# Patient Record
Sex: Male | Born: 1935 | Race: White | Hispanic: No | Marital: Married | Smoking: Former smoker
Health system: Southern US, Community
[De-identification: ages and names within clinical notes are randomized; demographics above are authoritative.]

## PROBLEM LIST (undated history)

## (undated) DIAGNOSIS — M21372 Foot drop, left foot: Secondary | ICD-10-CM

## (undated) DIAGNOSIS — I130 Hypertensive heart and chronic kidney disease with heart failure and stage 1 through stage 4 chronic kidney disease, or unspecified chronic kidney disease: Secondary | ICD-10-CM

## (undated) DIAGNOSIS — N182 Chronic kidney disease, stage 2 (mild): Secondary | ICD-10-CM

## (undated) DIAGNOSIS — G629 Polyneuropathy, unspecified: Secondary | ICD-10-CM

## (undated) DIAGNOSIS — I25119 Atherosclerotic heart disease of native coronary artery with unspecified angina pectoris: Secondary | ICD-10-CM

## (undated) DIAGNOSIS — K219 Gastro-esophageal reflux disease without esophagitis: Secondary | ICD-10-CM

## (undated) DIAGNOSIS — C61 Malignant neoplasm of prostate: Secondary | ICD-10-CM

## (undated) DIAGNOSIS — M21371 Foot drop, right foot: Secondary | ICD-10-CM

## (undated) DIAGNOSIS — F329 Major depressive disorder, single episode, unspecified: Secondary | ICD-10-CM

## (undated) DIAGNOSIS — E785 Hyperlipidemia, unspecified: Secondary | ICD-10-CM

## (undated) DIAGNOSIS — I2510 Atherosclerotic heart disease of native coronary artery without angina pectoris: Secondary | ICD-10-CM

## (undated) DIAGNOSIS — J849 Interstitial pulmonary disease, unspecified: Secondary | ICD-10-CM

## (undated) DIAGNOSIS — J8410 Pulmonary fibrosis, unspecified: Secondary | ICD-10-CM

## (undated) DIAGNOSIS — I25810 Atherosclerosis of coronary artery bypass graft(s) without angina pectoris: Secondary | ICD-10-CM

## (undated) DIAGNOSIS — F32A Depression, unspecified: Secondary | ICD-10-CM

## (undated) DIAGNOSIS — I10 Essential (primary) hypertension: Secondary | ICD-10-CM

## (undated) DIAGNOSIS — I5032 Chronic diastolic (congestive) heart failure: Secondary | ICD-10-CM

## (undated) DIAGNOSIS — I2723 Pulmonary hypertension due to lung diseases and hypoxia: Secondary | ICD-10-CM

## (undated) DIAGNOSIS — M1990 Unspecified osteoarthritis, unspecified site: Secondary | ICD-10-CM

## (undated) DIAGNOSIS — F419 Anxiety disorder, unspecified: Secondary | ICD-10-CM

## (undated) HISTORY — DX: Depression, unspecified: F32.A

## (undated) HISTORY — DX: Atherosclerotic heart disease of native coronary artery without angina pectoris: I25.10

## (undated) HISTORY — DX: Foot drop, left foot: M21.372

## (undated) HISTORY — DX: Chronic diastolic (congestive) heart failure: I50.32

## (undated) HISTORY — DX: Major depressive disorder, single episode, unspecified: F32.9

## (undated) HISTORY — DX: Unspecified osteoarthritis, unspecified site: M19.90

## (undated) HISTORY — PX: TONSILLECTOMY: SUR1361

## (undated) HISTORY — DX: Atherosclerosis of coronary artery bypass graft(s) without angina pectoris: I25.810

## (undated) HISTORY — DX: Pulmonary fibrosis, unspecified: J84.10

## (undated) HISTORY — PX: TRANSTHORACIC ECHOCARDIOGRAM: SHX275

## (undated) HISTORY — DX: Essential (primary) hypertension: I10

## (undated) HISTORY — DX: Gastro-esophageal reflux disease without esophagitis: K21.9

## (undated) HISTORY — DX: Interstitial pulmonary disease, unspecified: J84.9

## (undated) HISTORY — PX: OTHER SURGICAL HISTORY: SHX169

## (undated) HISTORY — DX: Foot drop, right foot: M21.371

## (undated) HISTORY — DX: Hyperlipidemia, unspecified: E78.5

## (undated) HISTORY — DX: Interstitial pulmonary disease, unspecified: I27.23

## (undated) HISTORY — DX: Anxiety disorder, unspecified: F41.9

## (undated) HISTORY — DX: Chronic kidney disease, stage 2 (mild): N18.2

## (undated) HISTORY — DX: Polyneuropathy, unspecified: G62.9

## (undated) HISTORY — PX: HEMORRHOID SURGERY: SHX153

## (undated) HISTORY — DX: Hypertensive heart and chronic kidney disease with heart failure and stage 1 through stage 4 chronic kidney disease, or unspecified chronic kidney disease: I13.0

## (undated) HISTORY — PX: CATARACT EXTRACTION, BILATERAL: SHX1313

## (undated) HISTORY — DX: Atherosclerotic heart disease of native coronary artery with unspecified angina pectoris: I25.119

---

## 1999-05-22 ENCOUNTER — Emergency Department (HOSPITAL_COMMUNITY): Admission: EM | Admit: 1999-05-22 | Discharge: 1999-05-22 | Payer: Self-pay | Admitting: Emergency Medicine

## 2000-12-29 DIAGNOSIS — J8410 Pulmonary fibrosis, unspecified: Secondary | ICD-10-CM

## 2000-12-29 HISTORY — DX: Pulmonary fibrosis, unspecified: J84.10

## 2001-02-19 ENCOUNTER — Ambulatory Visit (HOSPITAL_COMMUNITY): Admission: EM | Admit: 2001-02-19 | Discharge: 2001-02-19 | Payer: Self-pay | Admitting: Emergency Medicine

## 2001-03-08 ENCOUNTER — Ambulatory Visit (HOSPITAL_COMMUNITY): Admission: RE | Admit: 2001-03-08 | Discharge: 2001-03-08 | Payer: Self-pay | Admitting: Gastroenterology

## 2001-03-08 ENCOUNTER — Encounter: Payer: Self-pay | Admitting: Gastroenterology

## 2001-07-27 ENCOUNTER — Ambulatory Visit: Admission: RE | Admit: 2001-07-27 | Discharge: 2001-07-27 | Payer: Self-pay

## 2002-05-13 ENCOUNTER — Ambulatory Visit: Admission: RE | Admit: 2002-05-13 | Discharge: 2002-05-13 | Payer: Self-pay | Admitting: Gastroenterology

## 2002-12-29 DIAGNOSIS — I25119 Atherosclerotic heart disease of native coronary artery with unspecified angina pectoris: Secondary | ICD-10-CM

## 2002-12-29 HISTORY — PX: CORONARY ARTERY BYPASS GRAFT: SHX141

## 2002-12-29 HISTORY — DX: Atherosclerotic heart disease of native coronary artery with unspecified angina pectoris: I25.119

## 2003-10-10 ENCOUNTER — Ambulatory Visit (HOSPITAL_COMMUNITY): Admission: RE | Admit: 2003-10-10 | Discharge: 2003-10-10 | Payer: Self-pay | Admitting: Cardiovascular Disease

## 2003-10-10 ENCOUNTER — Encounter: Payer: Self-pay | Admitting: Cardiovascular Disease

## 2003-10-13 ENCOUNTER — Inpatient Hospital Stay (HOSPITAL_COMMUNITY): Admission: RE | Admit: 2003-10-13 | Discharge: 2003-10-17 | Payer: Self-pay | Admitting: Surgery

## 2003-10-13 ENCOUNTER — Encounter: Payer: Self-pay | Admitting: Surgery

## 2003-10-14 ENCOUNTER — Encounter: Payer: Self-pay | Admitting: Surgery

## 2003-10-15 ENCOUNTER — Encounter: Payer: Self-pay | Admitting: Surgery

## 2003-11-25 ENCOUNTER — Encounter (HOSPITAL_COMMUNITY): Admission: RE | Admit: 2003-11-25 | Discharge: 2004-02-23 | Payer: Self-pay | Admitting: Cardiology

## 2004-09-24 ENCOUNTER — Inpatient Hospital Stay (HOSPITAL_COMMUNITY): Admission: EM | Admit: 2004-09-24 | Discharge: 2004-09-24 | Payer: Self-pay | Admitting: *Deleted

## 2004-09-24 HISTORY — PX: CARDIAC CATHETERIZATION: SHX172

## 2007-10-20 ENCOUNTER — Encounter: Admission: RE | Admit: 2007-10-20 | Discharge: 2007-10-20 | Payer: Self-pay | Admitting: General Surgery

## 2007-10-22 ENCOUNTER — Encounter (INDEPENDENT_AMBULATORY_CARE_PROVIDER_SITE_OTHER): Payer: Self-pay | Admitting: General Surgery

## 2007-10-22 ENCOUNTER — Ambulatory Visit (HOSPITAL_BASED_OUTPATIENT_CLINIC_OR_DEPARTMENT_OTHER): Admission: RE | Admit: 2007-10-22 | Discharge: 2007-10-22 | Payer: Self-pay | Admitting: General Surgery

## 2008-01-19 HISTORY — PX: US ECHOCARDIOGRAPHY: HXRAD669

## 2009-10-01 ENCOUNTER — Encounter: Admission: RE | Admit: 2009-10-01 | Discharge: 2009-10-01 | Payer: Self-pay | Admitting: Cardiology

## 2009-11-21 ENCOUNTER — Ambulatory Visit (HOSPITAL_COMMUNITY): Admission: RE | Admit: 2009-11-21 | Discharge: 2009-11-21 | Payer: Self-pay | Admitting: Urology

## 2009-12-04 ENCOUNTER — Ambulatory Visit (HOSPITAL_COMMUNITY): Admission: RE | Admit: 2009-12-04 | Discharge: 2009-12-04 | Payer: Self-pay | Admitting: Urology

## 2009-12-10 ENCOUNTER — Ambulatory Visit: Admission: RE | Admit: 2009-12-10 | Discharge: 2009-12-28 | Payer: Self-pay | Admitting: Radiation Oncology

## 2010-01-02 ENCOUNTER — Ambulatory Visit
Admission: RE | Admit: 2010-01-02 | Discharge: 2010-03-08 | Payer: Self-pay | Source: Home / Self Care | Admitting: Radiation Oncology

## 2010-07-17 HISTORY — PX: CARDIOVASCULAR STRESS TEST: SHX262

## 2010-09-24 ENCOUNTER — Ambulatory Visit: Payer: Self-pay | Admitting: Cardiology

## 2011-01-28 ENCOUNTER — Ambulatory Visit: Payer: Self-pay | Admitting: Cardiology

## 2011-05-13 NOTE — Op Note (Signed)
NAME:  MANSOOR, HILLYARD             ACCOUNT NO.:  [id number]   MEDICAL RECORD NO.:  [id number]          PATIENT TYPE:  AMB   LOCATION:  [facility]                         FACILITY:  [facility]   PHYSICIAN:  Orson Ape. Weatherly, M.D.DATE OF BIRTH:  [birth year]   DATE OF PROCEDURE:  10/22/2007  DATE OF DISCHARGE:                               OPERATIVE REPORT   PREOPERATIVE DIAGNOSIS:  Internal and external hemorrhoids, pretty  severe.   POSTOPERATIVE DIAGNOSIS:  Internal and external hemorrhoids, pretty  severe.   OPERATION:  Internal and external hemorrhoidectomy.   ANESTHESIA:  General anesthesia.   SURGEON:  Dr. Jeanella Anton   ASSISTANT:  Nurse.   HISTORY:  Jidenna Figgs is a 76 year old male, who was referred to me  with Warren Danes for various symptomatic hemorrhoids.  The patient is  on aspirin and Plavix for coronary artery disease, as they got a bunch  of stents and, of course, he is having problems with bleeding and  hemorrhoids prolapsing.  Dr. Mare Ferrari said it would be fine for him to  be off his Plavix which he has been off for approximately a week, and he  is here now for the planned procedure.  He took a bottle of magnesium  citrate yesterday and said he had several bowel movements and has  actually had several this morning, and we gave him 1 g of Ancef  preoperatively.   The patient was positioned on the anesthesia table, induction of general  anesthesia and LOA tube, and then we put him up in the yellowfin  stirrups.  The area of the perianal area and rectum was prepped with  Betadine, first scrub and then solution and draped in a sterile manner.  With placing the anoscope into the anal canal, he has obviously got a  lot of diarrhea, and we sucked out the diarrhea that obviously the  laxative that he has taken he has not completely evacuated everything.  I then kind of reprepped him so I could anesthetize the sphincter area  for about 20 mL of 0.5% Marcaine  with adrenaline, about 10 in each side.  Next, with the patient's hemorrhoid, we marked them.  He has 3 large.  The largest is on the left side.  The right posterolateral is next, and  then there was a smaller one anterior.  I elevated the hemorrhoid off  the internal sphincter.  The little bleeding laterally and externally  was sutured with 3-0 chromic, and then a Bowie clamp was clamped on the  hemorrhoid after it had been elevated from the internal sphincter, then  the hemorrhoid removed.  I put a couple of U stitches of 2-0 chromic and  then after these had been tied, then started at the apex, a few sutures  over the clamp, removed the clamp, and then closed the external  hemorrhoid incision with running 2-0 chromic.  There was a little area  where I put a figure-of-eight of 2-0 chromic kind of in the area over  the sphincter.  Next, the right posterolateral was done.  It is not as  large as the left but  a very generous hemorrhoid.  It was done in the  similar manner.  The last hemorrhoid was a very, very large about the  whole left lateral side, and we elevated it over the sphincter.  Several  little sutures of 3-0 chromic and then closed the Bowie clamp under the  hemorrhoid, removed it, then put about 4 U stitches of 2-0 chromic and  then started in the apex, just kind of whip-stitched, locking some of  the stitches for closure of the mucosa and then the external skin.  Went  back and reinspected all areas, required a couple of little stitches in  the first hemorrhoid that was a little anteriorly for real good  hemostasis.  Next, I used the small anoscope and put 5% Xylocaine in the  anal canal held in place with a gauze for immediate postoperative pain  management.  The patient tolerated the procedure nicely, and we took him  out of the yellowfin, put little 4 x 4's over the anus, held in place  with the panties and then  extubated him.  Since his prep is such that he has more kind  of  diarrhea, I am going to put him on Augmentin for a couple of days since  he took the laxative, he just has diarrhea, and it was a little more  contamination in the field than I would like.           ______________________________  Orson Ape. Rise Patience, M.D.     WJW/MEDQ  D:  10/22/2007  T:  10/23/2007  Job:  [id number]   cc:   Darlin Coco, M.D.  Fax: [phone]

## 2011-05-16 NOTE — Op Note (Signed)
NAME:  Steven Cameron, Steven Cameron                       ACCOUNT NO.:  [id number]   MEDICAL RECORD NO.:  [id number]                   PATIENT TYPE:  INP   LOCATION:  2306                                 FACILITY:  [facility]   PHYSICIAN:  Gilford Raid, M.D.                  DATE OF BIRTH:  02/12/1935   DATE OF PROCEDURE:  10/13/2003  DATE OF DISCHARGE:                                 OPERATIVE REPORT   PREOPERATIVE DIAGNOSIS:  Left main and severe three vessel coronary artery  disease.   POSTOPERATIVE DIAGNOSIS:  Left main and severe three vessel coronary artery  disease.   PROCEDURE:  Median sternotomy, extracorporal circulation, coronary artery  bypass graft surgery x5 using the left internal mammary artery graft to the  left anterior descending coronary with a saphenous vein graft to the  diagonal branch of the left anterior descending, a sequential saphenous vein  graft to the intermediate and obtuse marginal branch of the left circumflex  coronary artery, and a saphenous vein graft to the posterior descending  coronary artery.  Endoscopic vein harvesting from the left leg.   SURGEON:  Gaye Pollack, M.D.   ASSISTANT:  Suzzanne Cloud, P.A.-C.   ANESTHESIA:  General endotracheal.   INDICATIONS:  This patient is a 76 year old gentleman with no prior cardiac  history who has hypertension, hyperlipidemia, and a positive family history  of heart disease and presented with an episode of substernal chest pain  radiating to his left arm while working.  He had a stress Cardiolite exam  performed that was an equivocal study.  He underwent cardiac catheterization  which showed severe three vessel coronary disease with about 70% distal left  main stenosis.  The left anterior descending had 70-80% proximal stenosis  and a 60-70% mid vessel stenosis.  There was a large first diagonal that had  a 50-60% ostial stenosis.  There was an intermediate vessel that had 60-70%  proximal stenosis, and the  left circumflex was subtotally occluded in its  mid portion prior to giving off a large marginal branch.  The right coronary  artery had a 70% proximal and 99% mid stenosis.  The left ventricular  ejection fraction was normal.  There is no significant mitral regurgitation.   After review of the angiogram and examination of the patient, it was felt  that coronary artery bypass graft surgery was the best treatment.  I  discussed the operative procedure with the patient and his wife including  alternatives, benefits, and risks including bleeding, possible blood  transfusion, infection, stroke, myocardial infarction and death.  We also  discussed the importance of maximum cardiac risk factor reduction.  They  understood and agreed to proceed.   DESCRIPTION OF PROCEDURE:  The patient was taken to the operating room and  placed on the table in the supine position.  After induction of general  endotracheal anesthesia, a Foley catheter was placed in  the bladder using a  sterile technique.  Then the chest, abdomen and both lower extremities were  prepped and draped in the usual sterile manner.  The chest was entered  through a median sternotomy incision and the pericardium opened in the  midline.  Examination of the heart showed good ventricular contractility.  The ascending aorta had no palpable plaque in it.   Then the left internal mammary artery was harvested from the chest wall as a  pedicle graft.  This was a medium caliber vessel with excellent blood flow  through it.  At the same time, a segment of greater saphenous vein was  harvested from the left leg using endoscopic vein harvest technique.  This  vein was of medium size and good quality.  Initially, we looked at the vein  in the right leg through a small incision adjacent to the knee.  This vein  was of small caliber and felt to be suboptimal for coronary artery bypass  surgery.  Therefore, the vein was harvested from the left  leg.   Then the patient was heparinized and when an adequate activated clotting  time was achieved, the distal ascending aorta was cannulated using a 20  French aortic cannula for arterial inflow.  Venous outflow was achieved  using the two stage venous cannula through the right atrial appendage.  Antegrade cardioplegia and vent cannula were inserted in the aortic root.   The patient was placed on coronary artery bypass and the distal coronary  arteries identified.  The LAD was a large graftable vessel.  The diagonal  branch was a medium size graftable vessel.  The intermediate was small and  graftable.  The obtuse marginal was a large graftable vessel.  The right  coronary artery was diseased in its proximal and mid portions but was  grafted distally just before the takeoff of the posterior descending branch  where there was no disease present.   Then the aorta was cross-clamped and 500 cc of cold blood antegrade  cardioplegia was administered in the aortic root with quick arrest of the  heart.  Systemic hypothermia to 20 degrees Centigrade and topical  hypothermia with iced saline was used.  A temperature probe was placed in  the septum and insulating pin in the pericardium.   The first distal anastomosis was performed to the intermediate coronary  artery.  The internal diameter was about 1.4 mm.  The conduit used was a  segment of greater saphenous vein.  The anastomosis was performed in a  sequential side-to-side manner  end-to-side manner using continuous 7-0  Prolene suture.  Flow was measured through the graft and was excellent.   The second distal anastomosis was performed in the obtuse marginal branch.  The internal diameter of this vessel was about 2 mm.  The conduit used was  the same segment of greater saphenous vein, and the anastomosis was  performed in a sequential side-to-side manner using continuous 7-0 Prolene sutures.  The flow was measured through the graft, and  was excellent.  Then  another dose of cardioplegia was given down the vein grafts and in the  aortic root.   The third distal anastomosis was performed to the distal right coronary  artery.  The internal diameter was greater than 2.5 mm.  The conduit used  was a  second segment of greater saphenous vein.  The anastomosis was  performed in a sequential side-to-side manner using continuous 7-0 Prolene  suture.  Flow was measured through  the graft and was excellent.   The fourth distal anastomosis was performed to the diagonal branch.  The  internal diameter of this vessel was 1.5 mm.  The conduit used was a third  segment of greater saphenous vein, and the anastomosis was performed in an  end-to-side manner using continuous 7-0 Prolene suture.  The flow was  measured through the graft and was excellent.  Then another dose of  cardioplegia was given.    The fifth distal anastomosis was performed to the distal portion of the left  anterior descending coronary artery.  The internal diameter was about 1.75  mm.  The conduit used was the left internal mammary graft.  This was brought  through an opening in the left pericardium anterior to the phrenic nerve.  This was anastomosed to the left anterior descending in an end-to-side  manner using continuous 8-0 Prolene suture.  The pedicle was tacked to the  epicardium with 6-0 Prolene sutures.   The patient was rewarmed to 37 degrees Centigrade, and the clamp removed  from the mammary pedicle.  There was rapid rewarming of the ventricular  septum and return of spontaneous ventricular fibrillation.  The cross clamp  was removed with a time of 57 minutes, and the patient spontaneously  converted to sinus rhythm.   A partial occlusion clamp was placed in the aortic root and the three  proximal vein grafts were performed in an end-to-side manner using a  continuous 6-0 Prolene suture.  The clamp was removed and vein grafts  deaired, and clamps  removed from the them.  The proximal and distal  anastomoses appeared hemostatic and lie of the graft was satisfactory.  Graft markers were placed around the proximal anastomosis.  Two temporary  right ventricular and right atrial pacing wires were placed and brought out  through the skin.   When the patient had rewarmed to 37 degrees centigrade, he was weaned from  cardiopulmonary bypass on no inotropic agents.  Total bypass time was 96  minutes.  Cardiac function appeared excellent with cardiac output of 4.5  liters per minute.  Protamine was given, and the venous and aortic cannula  were removed without difficulty.  Hemostasis was achieved.  Three chest  tubes were placed with two in the posterior pericardium, one in the left  pleural space and one in the anterior mediastinum.  The pericardium was  reapproximated over the heart.  The sternum was closed with #6 stainless steel wires.  The fascia was closed with continuous #1 Vicryl suture.  The  subcutaneous tissue was closed with a continuous 2-0 Vicryl, and the skin  with 3-0 Vicryl subcuticular closure.  The lower extremity vein harvest site  was closed in layers in a similar manner.  The sponge, needle and instrument  counts were correct according to the scrub nurse.  Dry sterile dressings  were applied over the incisions, around the chest tubes which were hooked to  Pleuravac suction.  The patient remained hemodynamically stable and was  transported to the SICU in guarded but stable condition.                                               Gilford Raid, M.D.    BB/MEDQ  D:  10/13/2003  T:  10/13/2003  Job:  [id number]   cc:   Darlin Coco, M.D.  [address], [place]  [place] [address]  Fax: [phone]   Cardiac Catheterization Lab

## 2011-05-16 NOTE — H&P (Signed)
NAME:  Steven Cameron, Steven Cameron NO.:  [id number]   MEDICAL RECORD NO.:  [id number]                   PATIENT TYPE:  OIB   LOCATION:                                       FACILITY:  [facility]   PHYSICIAN:  Thayer Headings, M.D.              DATE OF BIRTH:  02-26-1935   DATE OF ADMISSION:  10/10/2003  DATE OF DISCHARGE:                                HISTORY & PHYSICAL   Steven Cameron is a middle-aged gentleman with a history of chest pains.  He  was recently seen by Dr. Mare Ferrari and was found to have an abnormal stress-  Cardiolite study.  He was found to have some evidence of inferobasilar  attenuation.  Because of these problems he is referred for heart  catheterization.   Since his episodes of chest pain, Steven Cameron has not had any further  episodes of chest pain.  He has overall done fairly well although he has not  been doing much in the way of exercise.  The episode on that particular day  lasted about 15 minutes.  He stated it went away as soon as he quit working  and it resolved fairly quickly.  He did not have any associated shortness of  breath, diaphoresis, syncope, or presyncope.   CURRENT MEDICATIONS:  1. Inderal LA 80 mg daily.  2. Aspirin 81 mg daily.  3. Allopurinol one daily.  4. Colchicine 0.6 mg b.i.d.  5. Prilosec 20 mg daily.  6. Nitroglycerin p.r.n.   ALLERGIES:  No known drug allergies.   PAST MEDICAL HISTORY:  History of hypertension.   SOCIAL HISTORY:  The patient does not smoke.  He chews tobacco.  He does not  drink any alcohol.   FAMILY HISTORY:  Positive for coronary artery disease.  His father has  coronary disease and has had several stents.   REVIEW OF SYSTEMS:  His review of systems was reviewed and is essentially  negative.   PHYSICAL EXAMINATION:  GENERAL:  He is a middle-aged gentleman in no acute  distress.  He is alert and oriented x3 and his mood and affect is normal.  VITAL SIGNS:  His weight is 182, his blood  pressure is 140/80, heart rate is  60.  HEENT:  Exam reveals 2+ carotids, he has no bruits.  There is no JVD, no  thyromegaly.  LUNGS:  Clear to auscultation.  HEART:  Regular rate S1, S2 with no murmurs, rubs, or gallops.  ABDOMEN:  Reveals good bowel sounds and is nontender.  EXTREMITIES:  No clubbing, cyanosis or edema.  NEUROLOGIC:  Exam is nonfocal.    IMPRESSION AND PLAN:  Steven Cameron presents with an episode of chest pain.  He has inferobasilar attenuation on his Cardiolite scan.  I have recommended  that we proceed with heart catheterization for further evaluation.  We  discussed the risks, benefits and options of heart catheterization.  He  understands and agrees to proceed.                                                 Thayer Headings, M.D.    PJN/MEDQ  D:  10/09/2003  T:  10/10/2003  Job:  [id number]

## 2011-05-16 NOTE — Cardiovascular Report (Signed)
   NAME:  Steven Cameron, Steven Cameron NO.:  [id number]   MEDICAL RECORD NO.:  [id number]                   PATIENT TYPE:  OIB   LOCATION:  2894                                 FACILITY:  [facility]   PHYSICIAN:  Thayer Headings, M.D.              DATE OF BIRTH:  [birth year]   DATE OF PROCEDURE:  10/10/2003  DATE OF DISCHARGE:                              CARDIAC CATHETERIZATION   INDICATIONS:  Mr. Eleazer is a 76 year old gentleman with a history of some  recent shortness of breath and some chest pain.  He had a stress Cardiolite  study which revealed interior basilar ischemia.  He is referred for heart  catheterization for further evaluation.   PROCEDURE:  Left heart catheterization with coronary angiography.  The right  femoral artery was easily cannulated using a modified Seldinger technique.   HEMODYNAMICS:  The left ventricular pressure was 115/14 with an aortic  pressure of 116/55.   ANGIOGRAPHY:  The left main coronary artery is normal in the proximal  segment.  The distal left main has a 60-70% taper.   The left anterior descending artery has a proximal 70-80% fusiform stenosis.  There is a mid 60-70% stenosis and the distal vessel has minor luminal  irregularities.   The first diagonal vessel has a 50-60% stenosis.   The ramus intermediate branch actually arises before the takeoff of the  circumflex in the left anterior descending artery.  There is a proximal 60-  70% in that vessel.   The left circumflex artery has a subtotal occlusion in the mid segment just  prior to giving off a large obtuse marginal artery.   The right coronary artery has a proximal 70% stenosis and a mid 99%  stenosis.  The distal vessel has minor luminal irregularities.   LEFT VENTRICULOGRAM:  The left ventriculogram was performed in a 30 RAO  position.  It reveals overall normal left ventricular systolic function.  There was some catheter induced mitral regurgitation but it  did not appear  there was much MR when the heart was beating in a normal manner.   COMPLICATIONS:  None.    CONCLUSION:  1. Severe three vessel coronary artery disease with left main involvement.  2. Abnormal left ventricular systolic function.  He will probably need     coronary artery bypass grafting.                                               Thayer Headings, M.D.    PJN/MEDQ  D:  10/10/2003  T:  10/10/2003  Job:  [id number]   cc:   Darlin Coco, M.D.  [address] [place]  [place] [address]  Fax: [phone]   CVTS

## 2011-05-16 NOTE — H&P (Signed)
NAME:  Steven Cameron, Steven Cameron NO.:  [id number]   MEDICAL RECORD NO.:  [id number]          PATIENT TYPE:  INP   LOCATION:  1827                         FACILITY:  [facility]   PHYSICIAN:  Ludwig Lean. Doreatha Lew, M.D.DATE OF BIRTH:  26-Nov-1935   DATE OF ADMISSION:  09/24/2004  DATE OF DISCHARGE:                                HISTORY & PHYSICAL   CHIEF COMPLAINT:  Chest pain.   HISTORY OF PRESENT ILLNESS:  The patient is a 76 year old white male who was  seen and evaluated in the emergency room this morning.  He awoke with left  arm heaviness, numbness, and significant diaphoresis.  There was possibly  some shortness of breath.  Over the last several days, he has been having  intermittent chest pain as well.  It is more localized to the left chest.  It has not really been exertional in nature, but he notes he just doesn't  feel quite right.  He was subsequently seen and evaluated in the emergency  room and referred on for admission with plans for elective cardiac  catheterization.   PAST MEDICAL HISTORY:  1.  Atherosclerotic cardiovascular disease.  2.  Previous history of bypass grafting in October 2004 with left internal      mammary artery to the LAD, saphenous vein graft to first diagonal,      saphenous vein graft sequentially to the ramus intermediate and OM-1,      and saphenous vein graft to the right coronary artery performed by Gilford Raid, M.D. on October 13, 2003.  3.  Hypertension.  4.  Reflux.  5.  Known esophageal stricture with previous dilatation.  6.  Hyperlipidemia.  7.  Cervical disk disease.  8.  History of gout.   ALLERGIES:  None.   CURRENT MEDICATIONS:  1.  Toprol XL 25 mg q day.  2.  Lipitor 10 mg daily.  3.  Advil p.r.n.  4.  Nitroglycerin p.r.n.  5.  Prilosec 20 mg a day.  6.  Colchicine 0.6 mg b.i.d.  7.  Allopurinol 100 mg a day.  8.  Aspirin daily.   FAMILY HISTORY:  His father is in his early 80s and has had previous bypass  grafting as well as stent placement.  His mother is still living in her late  33s.  She does have a history of mitral valve prolapse.   SOCIAL HISTORY:  He is married.  He has two children.  He has no current  alcohol or tobacco use.  He does chew tobacco, however,  He has been under  significant amount of family stress.   REVIEW OF SYSTEMS:  Basically as noted above.  He does try to remain active.  He has had some intermitted shortness of breath.  He has had no  lightheadedness and no dizziness.  He has had recent URI which was treated  with Zithromax.  He has had no syncope, no headaches, no lower extremity  edema.  Rest of Review of Systems is as noted above and otherwise  unremarkable.   PHYSICAL EXAMINATION:  GENERAL:  He is  currently in no acute distress.  VITAL SIGNS:  Blood pressure 147/52, heart rate 68, respiratory rate 18,  afebrile.  SKIN:  Warm and dry.  Color is unremarkable.  LUNGS:  Basically clear.  HEART:  Regular rhythm.  ABDOMEN:  Soft, positive bowel sounds, nontender.  EXTREMITIES:  Without edema.  NEUROLOGIC:  Intact.  There are no gross focal deficits.   LABORATORY AND X-RAY DATA:  Pertinent labs:  His first three cardiac markers  are negative.   EKG shows sinus rhythm with no acute changes.   OVERALL IMPRESSION:  1.  Continued bouts of chest pain in the setting of shortness of breath and      diaphoresis of uncertain etiology.  2.  Know coronary disease with history of bypass grafting approximately one      year ago.  3.  Hypertension.  4.  Hyperlipidemia.  5.  Situational stress.  6.  History of gout.  7.  Reflux with history of esophageal stricture status post dilatation.   PLAN:  It is felt that it is best to proceed on with cardiac catheterization  to discern the exact etiology of Mr. Janes symptoms.  Certainly if his  bypass grafts are patent, his overall prognosis should be quite good.  He  has had a previous negative stress Cardiolite  that was performed just two  months post bypass surgery which was unremarkable. We will proceed on with  cardiac catheterization.  The procedure has been reviewed in full detail  including the risks and benefits with both him and his wife, and he is  willing to proceed today, September 24, 2004.       LC/MEDQ  D:  09/24/2004  T:  09/24/2004  Job:  [id number]   cc:   Darlin Coco, M.D.  [address], [address] [place]  [place] [address]  Fax: [phone]

## 2011-05-16 NOTE — Procedures (Signed)
[facility]  Patient:    Steven Cameron, Steven Cameron Visit Number: [id number] MRN: [id number]          Service Type: Attending:  Elyse Jarvis. Amedeo Plenty, M.D. Dictated by:   Elyse Jarvis Amedeo Plenty, M.D. Proc. Date: 05/13/02   CC:         Cleotis Nipper, M.D.   Procedure Report  PROCEDURE:  Esophagogastroduodenoscopy.  INDICATIONS FOR PROCEDURE:  Sensation of food impacted in the esophagus in a patient who has had food impactions in the past and has required dilatation of a known esophageal ring last done in March 2002.  DESCRIPTION OF PROCEDURE:  The patient was placed in the left lateral decubitus position, then placed on the pulse monitor with continuous low flow oxygen delivered by nasal cannula. He was sedated with 50 mg IV Demerol and 5 mg IV Versed. The Olympus video endoscope was advanced under direct vision into the oropharynx and esophagus. The esophagus was tortuous but of normal caliber with the squamocolumnar line at 35 cm with a fairly well defined lower esophageal ring. There was no foreign body or food debris seen in the esophagus and no visible inflammation around the ring. There was a 4 cm hiatal hernia distal to the ring. The stomach was entered and a small amount of liquid secretions were suctioned from the fundus. Retroflexed view of the cardia confirmed a hiatal hernia and was otherwise unremarkable. No food was seen in the stomach. The fundus and body appeared normal. The antrum showed a rather marked appearance of gastritis with focal erosions with exudate, some 3-4 mm in diameter. A CLOtest was obtained. The pylorus was nondeformed and easily allowed passage of the endoscope tip into the duodenum. Both the bulb and second portion were well inspected and appeared to be within normal limits. The scope was then withdrawn and the patient returned to the recovery room in stable condition. The patient tolerated the procedure well and there were no immediate  complications.  IMPRESSION:  1. No impacted food in the esophagus.  2. Lower esophageal ring.  3. Hiatal hernia.  4. Fairly marked gastritis.  PLAN:  Will review history of nonsteroidal anti-inflammatory drug use and I will await CLOtest. I will also schedule him for elective esophageal dilatation in a few weeks. Dictated by:   Elyse Jarvis Amedeo Plenty, M.D. Attending:  Elyse Jarvis. Amedeo Plenty, M.D. DD:  05/13/02 TD:  05/15/02 Job: [id number]

## 2011-05-16 NOTE — Discharge Summary (Signed)
NAME:  Steven Cameron, Steven Cameron                       ACCOUNT NO.:  [id number]   MEDICAL RECORD NO.:  [id number]                   PATIENT TYPE:  INP   LOCATION:  2040                                 FACILITY:  [facility]   PHYSICIAN:  Gilford Raid, M.D.                  DATE OF BIRTH:  Aug 27, 1935   DATE OF ADMISSION:  10/13/2003  DATE OF DISCHARGE:  10/17/2003                                 DISCHARGE SUMMARY   HISTORY OF PRESENT ILLNESS:  This is a 76 year old gentleman patient of  Darlin Coco, M.D. who was referred this hospitalization to Thayer Headings, M.D. for elective cardiac catheterization due to recent episodes of  increased chest pain.  He underwent adenosine nuclear medicine stress test  and this was felt to be an equivocal study.  The patient was admitted this  hospitalization for cardiac catheterization and further evaluation depending  on findings.   PAST MEDICAL HISTORY:  1. Hypertension.  2. Hyperlipidemia.   FAMILY HISTORY:  Coronary disease.  His father had a coronary artery bypass  grafting.  Other diagnoses include gout, history of esophageal stenosis,  history of a spinal cyst removal, ?pilonidal.   MEDICATIONS ON ADMISSION:  1. Inderal LA 80 mg daily.  2. Aspirin 81 mg daily.  3. Allopurinol 100 mg daily.  4. Colchicine 0.6 mg b.i.d.  5. Prilosec 20 mg daily.  6. Aleve p.r.n.  7. Nitroglycerin p.r.n.   ALLERGIES:  No known drug allergies.   FAMILY HISTORY:  Please see the history and physical done at the time of  admission.   SOCIAL HISTORY:  Please see the history and physical done at the time of  admission.   REVIEW OF SYSTEMS:  Please see the history and physical done at the time of  admission.   PHYSICAL EXAMINATION:  Please see the history and physical done at the time  of admission.   HOSPITAL COURSE:  The patient was admitted and taken to the cardiac  catheterization laboratory and the study was performed by Thayer Headings,  M.D.   Findings showed significant left main and severe three vessel coronary  artery disease.  Coronary anatomy showed that there was a 70% left main  lesion, a 70-80% proximal LAD lesion, a diagonal lesion of 50-60%, a ramus  intermedius lesion of 60-70%, a right coronary artery lesion of 70% in the  proximal portion and 99% in the mid portion.  The ejection fraction was  calculated at 65%.  There was mild mitral regurgitation which was felt  possibly due to ectopy.  Due to these findings, surgical consultation was  obtained with Gilford Raid, M.D. who evaluated the patient and studies and  agreed that surgical revascularization was his best option due to the  severity of his anatomy and the increasing unstable anginal symptoms.  Procedure:  On October 13, 2003 the patient was taken to the cardiac  operating room  where he underwent the following procedure:  Coronary artery  bypass grafting x5.  The following grafts were placed:  Left internal  mammary artery to the left anterior descending, a saphenous vein graft to  the diagonal #1, a saphenous vein graft sequentially to the ramus  intermedius and obtuse marginal #1, and finally a saphenous vein graft to  the right coronary artery.  Cross clamp time was 57 minutes.  The patient  tolerated procedure well.  Was taken to the surgical intensive care unit in  stable condition.  Postoperative hospital course the patient has done quite  well.  His intensive care stay was unremarkable.  All routine lines,  monitors, and drainage devices were discontinued in a standard fashion.  Laboratory values have remained stable and he has not required transfusion.  Most recent hemoglobin and hematocrit dated October 15, 2003 were 10.1 and  28.4, respectively.  Electrolytes, BUN, and creatinine have been stable.  On  EKG postoperatively there was slight diffuse ST elevation consistent with a  mild pericarditis.  The patient showed no clinical sequelae due to this.   He  has maintained a normal sinus rhythm without significant ectopy or  dysrhythmia.  He has remained afebrile.  His incisions are healing well  without signs of infection.  Oxygen has been weaned and he maintains  adequate saturations on room air.  The patient has tolerated a routine  advancement in activity commensurate for level of postoperative  convalescence using cardiac rehabilitation phase I modalities.  Overall, the  patient is tentatively felt to be stable for discharge in the morning of  October 17, 2003 pending morning round reevaluation.   DISCHARGE MEDICATIONS:  1. Aspirin 325 mg daily.  2. Lipitor 10 mg daily each evening.  3. Toprol XL 25 mg daily.  4. Colchicine 0.6 mg b.i.d.  5. Zyloprim 100 mg daily.  6. Prilosec 20 mg daily.  7. For pain Tylox one to two q.4-6h. as needed.   DISCHARGE INSTRUCTIONS:  The patient received written instructions in regard  to medications, activity, diet, wound care, and follow-up.  Follow-up will  include Gilford Raid, M.D. on Tuesday, November 9 at 11:30 a.m.  Additionally, follow-up will be arranged for the patient to see Darlin Coco, M.D. in two weeks.  The patient is instructed to call the office.   CONDITION ON DISCHARGE:  Stable, improving.   FINAL DIAGNOSES:  1. Severe left main coronary artery disease with three vessel coronary     artery disease.  2. Normal left ventricular function.  3. Hypertension.  4.     Hyperlipidemia.  5. Gout.  6. Postoperative anemia.      John Giovanni, P.A.-C.                    Gilford Raid, M.D.    Loren Racer  D:  10/16/2003  T:  10/16/2003  Job:  [id number]   cc:   Thayer Headings, M.D.  [address], [place] [address]  Fax: [phone]   Darlin Coco, M.D.  [address] [place]  [place] [address]  Fax: [phone]

## 2011-05-16 NOTE — Procedures (Signed)
[facility]  Patient:    Steven Cameron, Steven Cameron                    MRN: [id number] Proc. Date: 02/19/01 Adm. Date:  [id number] Disc. Date: [id number] Attending:  Molpus, Karen Chafe CC:         Cleotis Nipper, M.D.   Procedure Report  PROCEDURES PERFORMED:  Esophagogastroduodenoscopy with foreign body removal.  INDICATIONS:  Sensation of impacted food in the esophagus while eating steak tonight, subsequently, unable to keep down liquids or solids.  DESCRIPTION OF PROCEDURE:  The patient was placed in the left lateral decubitus position and placed on the pulse monitor with continuous low-flow oxygen delivered by nasal cannula. He was sedated with 60 mg IV Demerol and 6 mg IV Versed.  The Olympus video endoscope was advanced under direct vision into the oropharynx and esophagus. The esophagus was tortuous distally and there was a food bolus in the distal esophagus that initially appeared to be impacted, but with gentle pressure passed on into the stomach, revealing a concentric lower esophageal ring and a 2 cm hiatal hernia. There was no resistance to passage of the scope through the ring. There was no esophageal ulcer, severe esophagitis or any visible evidence of neoplasm. The stomach had a large amount of partially undigested food in it. I ran the scope briefly down into the stomach and obtained a retroflexed view which showed an unremarkable cardia. The nondependent portions of the stomach appeared normal down to the pylorus, but I did not enter the duodenum. The scope was then withdrawn and the patient returned to the recovery room in stable condition. He tolerated the procedure well and there were no immediate complications.  IMPRESSION:  Impacted food bolus in lower esophagus secondary to esophageal ring, relieved by pressure with the endoscope.  PLAN:  Protime pump inhibitor, followed by office follow-up and eventual elective EGD with esophageal  dilatation. DD:  02/19/01 TD:  02/22/01 Job: [id number]

## 2011-05-16 NOTE — Procedures (Signed)
[facility]  Patient:    Steven Cameron, Steven Cameron                    MRN: [id number] Proc. Date: 03/08/01 Adm. Date:  [id number] Attending:  Ernie Avena CC:         Joyice Faster. Mare Ferrari, M.D.   Procedure Report  PROCEDURE PERFORMED:  Upper endoscopy with Savary dilatation of the esophagus.  ENDOSCOPIST:  Cleotis Nipper, M.D.  INDICATIONS FOR PROCEDURE:  The patient is a 76 year old gentleman with recurrent food impactions and history of previous esophageal dilatation about 10 years ago.  FINDINGS:  Esophageal ring dilated to 18 mm above a 3 cm hiatal hernia.  DESCRIPTION OF PROCEDURE:  The nature, purpose and risks of the procedure had been reviewed with the patient day and he provided written consent.  The Olympus video endoscope was passed under direct vision.  The vocal cords looked normal.  The esophagus was easily entered.  The esophageal mucosa was normal without evidence of Barretts esophagus, reflux esophagitis, varices, infection or neoplasia.  There was a widely patent, fairly muscular esophageal ring and below this was a 3 cm hiatal hernia.  Retroflex viewing showed that the diaphragmatic hiatus was quite patulous, probably about 5 cm.  The gastric mucosa had patchy erythema in the antrum, possibly consistent with aspirin or NSAID exposure.  No frank erosions or ulcers were noted.  No polyps or masses were seen anywhere in the stomach.  The pylorus, duodenum bulb and second duodenum looked normal.  Savary dilatation was then performed in the standard fashion.  The spring tip guide wire was passed through the scope into the antrum of the stomach. The scope was removed in an exchange fashion and the 18 mm Savary dilator was slid over the wire under fluoroscopic guidance, such that the widest portion went below the level of the diaphragm, where upon the dilator and the guide wire were removed and the patient was re-endoscoped  under direct vision which showed some fresh hemorrhage at the site of the ring consistent with fracturing the ring, but no evidence of undue trauma to the esophagus, hypopharynx or stomach.  The patient tolerated the procedure well and appeared pain free thereafter.  IMPRESSION:  Esophageal ring dilated to 18 mm by Savary technique as described above.  Antral gastritis, possibly medication related (nonerosive).  Medium sized hiatal hernia with patulous diaphragmatic hiatus.  PLAN:  Clinical follow-up of dysphagia symptoms.  PLAN: DD:  03/08/01 TD:  03/09/01 Job: [id number]

## 2011-05-16 NOTE — Cardiovascular Report (Signed)
NAME:  Steven Cameron, Steven Cameron NO.:  [id number]   MEDICAL RECORD NO.:  [id number]          PATIENT TYPE:  INP   LOCATION:  3715                         FACILITY:  [facility]   PHYSICIAN:  Ludwig Lean. Doreatha Lew, M.D.DATE OF BIRTH:  30-May-1935   DATE OF PROCEDURE:  DATE OF DISCHARGE:                              CARDIAC CATHETERIZATION   DATE OF PROCEDURE:  September 24, 2004.   HISTORY:  Steven Cameron is a 76 year old male with previous coronary artery  bypass grafting a year ago.  He presents with atypical chest pain.  Initial  cardiac enzymes were negative.   PROCEDURE:  Left heart catheterization with selective coronary angiography,  left ventricular angiography, saphenous vein graft angiography x3, and  angiography of the left internal mammary artery.   TYPE AND SITE OF ENTRY:  Percutaneous right femoral artery with Perclose.   CATHETERS:  6-French 4-curved Judkins right and left coronary with a 6-  French pigtail ventricular __________ catheter, 6-French internal mammary  graft catheter.   CONTRAST MATERIAL:  Omnipaque.   MEDICATIONS GIVEN PRIOR TO PROCEDURE:  Valium 10 mg p.o.   MEDICATIONS GIVEN DURING PROCEDURE:  Versed 3 mg IV, Ancef 1 gram IV.   COMMENTS:  The patient tolerated the procedure well.   HEMODYNAMIC DATA:  The aortic pressure was 146/73, LV was 109/2-9.  There  was no aortic valve gradient noted on pullback.   ANGIOGRAPHIC DATA:  1.  Left main coronary artery:  The left main coronary artery had distal 60-      70% narrowing.  2.  Left anterior descending:  The left anterior descending had segmental      70% narrowing proximally.  There is a portion in the mid portion of the      left anterior descending where there is a large diagonal vessel that      bifurcates.  There is 70+% stenosis at the level of the bifurcation of      the diagonal left anterior descending in the mid portion of the artery.      There is competitive bidirectional flow into  the diagonal as well as the      distal left anterior descending.  3.  Left circumflex:  The left circumflex is totally occluded proximally.  4.  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      There is a 70% narrowing proximally.  It is totally occluded after the      first acute marginal.  5.  Saphenous vein graft to the first diagonal vessel is widely patent with      a nice insertion.  There is retrograde filling of the left anterior      descending proper including filling of the septal perforating branches.  6.  Saphenous vein graft to the intermediate and left circumflex      sequentially is widely patent with a nice insertion and good distal      runoff.  In the distal obtuse marginal branch, there is retrograde      filling of a second branch of the bypass vessel.  It is  in this segment      that there is a 70-80% narrowing separating the distal branch from where      the bypass graft inserts.  7.  Saphenous vein graft to the distal right coronary artery is widely      patent with a nice insertion and good distal runoff.  There is a mild,      20-30% narrowing in the mid portion of the body of the graft.  The  distal runoff is satisfactory.  1.  Left internal mammary graft to the LAD is widely patent with a nice      insertion and good distal runoff.  There is catheter damping when the      catheter enters the ostium of the left internal mammary artery graft,      but it overall is felt to be satisfactory and it has satisfactory flow      throughout.   LEFT VENTRICULAR ANGIOGRAM:  Left ventricular angiogram was performed in the  RAO position.  Overall cardiac size and silhouette are normal.  Global  ejection fraction is 60%.  Regional wall motion is normal.   OVERALL IMPRESSION:  1.  Severe three-vessel coronary disease.  2.  Patent saphenous vein grafts x4, with patent left internal mammary      graft.  3.  Normal left ventricular function.    DISCUSSION:  It is felt that Steven Cameron is stable at this point in time.  He does have quite diffuse atherosclerosis and will need to be managed with  aggressive anti-platelet agents and lipid lowering.  It may be some  consideration for additional beta blocker.  It may be that the discomfort  that he felt on this admission was, in fact, related to gastroesophageal  reflux, and certainly these findings are not inconsistent with that.  We  will aggressively manage that aspect of his care.       SNT/MEDQ  D:  09/24/2004  T:  09/24/2004  Job:  [id number]   cc:   Steven Cameron, M.D.  [address] [place]  [place] [address]  Fax: [phone]

## 2011-05-16 NOTE — Discharge Summary (Signed)
NAME:  Steven Cameron, TRAGESER NO.:  [id number]   MEDICAL RECORD NO.:  [id number]          PATIENT TYPE:  INP   LOCATION:  3715                         FACILITY:  [facility]   PHYSICIAN:  Ludwig Lean. Doreatha Lew, M.D.DATE OF BIRTH:  Oct 22, 1935   DATE OF ADMISSION:  09/24/2004  DATE OF DISCHARGE:  09/24/2004                                 DISCHARGE SUMMARY   PRIMARY DISCHARGE DIAGNOSES:  Chest pain with subsequent elective cardiac  catheterization with 60% distal left main, 100% left circumflex, 70%  proximal LAD, with segmental disease, 70-80% proximal right coronary artery  with 100% distal occlusion.  The saphenous vein to the right coronary,  saphenous vein graft to the first diagonal, saphenous vein graft to the  intermediate and obtuse marginal are all patent.  The left internal mammary  to the LAD is patent.  The patient will continue with medical therapy.   SECONDARY DISCHARGE DIAGNOSES:  1.  Known atherosclerotic cardiovascular disease.  2.  Previous history of bypass grafting in October of 2004.  3.  Hypertension.  4.  Hyperlipidemia.  5.  Reflux.  6.  History of esophageal structure.  7.  History of gout.   HISTORY OF PRESENT ILLNESS:  The patient is a 76 year old white male who has  multiple medical problems who presents to the emergency department this  morning after awakening with chest discomfort.  It was associated with left-  arm numbness.  It was somewhat different from his previous chest pain  syndrome, but he has not felt well over the past few days.  He has had  reported episodes of shortness of breath and diaphoresis.  He was  subsequently brought to the emergency room where he was admitted for further  evaluation.   Please see dictated history and physical for further patient presentation  and profile.   LABORATORY DATA:  On admission, cardiac enzymes were negative.  EKG was  unremarkable.   HOSPITAL COURSE:  The patient was admitted electively.   His home medicines  were continued.  He was placed on IV nitroglycerin and IV heparin.  We  proceeded on with cardiac catheterization on the same day of admission.  Those results are as noted above.  It is felt that the patient is best  managed with continued medical therapy.  He will be tried on Plavix.  He  does report a tendency for easy bleeding and bruising, and will attempt  Plavix use for at least one month.   Post-procedure, he was transferred back to 3700, and plans are now made for  him to be discharged tonight once bedrest is complete.   DISCHARGE CONDITION:  Stable.   DISCHARGE MEDICATIONS:  1.  He will resume his Toprol, Colchicine, allopurinol and aspirin as      before.  2.  We will increase his Lipitor to 40 mg per day.  3.  Prilosec will be increased to two times a day.  4.  He will try Plavix 75 mg a day at least for the first month.  This is to      be taken with aspirin.  DISCHARGE ACTIVITY:  1.  His activity is to be light over the next few days.  2.  He is to place an ice-pack if needed to the groin.  Otherwise, we will      plan on seeing him back in the office with Dr. Darlin Coco in      approximately 10 days.  He is asked to call to schedule that      appointment, certainly sooner if problems arise in the interim.       LC/MEDQ  D:  09/24/2004  T:  09/24/2004  Job:  [id number]   cc:   Darlin Coco, M.D.  [address] [place]  [place] [address]  Fax: [phone]

## 2011-06-30 ENCOUNTER — Other Ambulatory Visit: Payer: Self-pay | Admitting: *Deleted

## 2011-06-30 DIAGNOSIS — F419 Anxiety disorder, unspecified: Secondary | ICD-10-CM

## 2011-06-30 MED ORDER — ALPRAZOLAM 0.5 MG PO TABS: 0.5000 mg | ORAL_TABLET | Freq: Three times a day (TID) | ORAL | Status: AC | PRN

## 2011-06-30 NOTE — Telephone Encounter (Signed)
Faxed signed rx back to pharmacy Refilled meds per fax request.

## 2011-08-01 ENCOUNTER — Encounter: Payer: Self-pay | Admitting: Cardiology

## 2011-08-07 ENCOUNTER — Encounter: Payer: Self-pay | Admitting: Cardiology

## 2011-08-08 ENCOUNTER — Ambulatory Visit (INDEPENDENT_AMBULATORY_CARE_PROVIDER_SITE_OTHER): Payer: Medicare Other | Admitting: Cardiology

## 2011-08-08 ENCOUNTER — Encounter: Payer: Self-pay | Admitting: Cardiology

## 2011-08-08 VITALS — BP 120/70 | HR 66 | Wt 196.0 lb

## 2011-08-08 DIAGNOSIS — Z9889 Other specified postprocedural states: Secondary | ICD-10-CM

## 2011-08-08 DIAGNOSIS — Z951 Presence of aortocoronary bypass graft: Secondary | ICD-10-CM | POA: Insufficient documentation

## 2011-08-08 DIAGNOSIS — I119 Hypertensive heart disease without heart failure: Secondary | ICD-10-CM

## 2011-08-08 DIAGNOSIS — E785 Hyperlipidemia, unspecified: Secondary | ICD-10-CM

## 2011-08-08 DIAGNOSIS — R42 Dizziness and giddiness: Secondary | ICD-10-CM

## 2011-08-08 MED ORDER — VALSARTAN-HYDROCHLOROTHIAZIDE 80-12.5 MG PO TABS: ORAL_TABLET | ORAL | Status: DC

## 2011-08-08 NOTE — Progress Notes (Signed)
Allegra Lai Date of Birth:  1935/12/02 [facility] [address]   [place], [place]  [address] [phone]           Fax   [phone]  History of Present Illness: This pleasant 76 year old gentleman is seen for a six-month followup office visit.  He has a past history of ischemic heart disease.  He underwent cardiac catheterization in 2005.  He had previous coronary bypass graft surgery in 2004.  Recently he's been experiencing problems with lightheadedness and dizziness.  It is not vertigo.  There is no sensation of the room spinning around is also had decreased sensation below his knees.  He has seen a neurologist who diagnosed neuropathy.  The patient previously had been on Lipitor which was stopped because of myalgias and he is now on a low dose of lovastatin.  He has a history of essential hypertension and has been on low dose Diovan HCT.  He does note that the sensation of lightheadedness is worse when he stands up and better when he lies down.  Current Outpatient Prescriptions  Medication Sig Dispense Refill  . allopurinol (ZYLOPRIM) 100 MG tablet Take 100 mg by mouth daily.        Marland Kitchen ALPRAZolam (XANAX) 0.5 MG tablet Take 0.25 mg by mouth 3 (three) times daily.        Marland Kitchen aspirin 81 MG EC tablet Take 81 mg by mouth daily.        . clopidogrel (PLAVIX) 75 MG tablet Take 75 mg by mouth daily.        . fish oil-omega-3 fatty acids 1000 MG capsule Take 2 g by mouth daily.        Marland Kitchen FLUoxetine (PROZAC) 10 MG capsule Take 10 mg by mouth daily.        Marland Kitchen lovastatin (MEVACOR) 10 MG tablet Take 10 mg by mouth at bedtime.        . Multiple Vitamins-Minerals (OSTEO COMPLEX PO) Take by mouth 2 (two) times daily.        Marland Kitchen omeprazole (PRILOSEC) 20 MG capsule Take 40 mg by mouth daily.        . Red Yeast Rice Extract (RED YEAST RICE PO) Take by mouth daily.        . valsartan-hydrochlorothiazide (DIOVAN-HCT) 80-12.5 MG per tablet Take just half a diovan -HCT  each day        Allergies  Allergen Reactions  . Ibuprofen   . Statins     There is no problem list on file for this patient.   History  Smoking status  . Former Smoker  . Quit date: 08/01/1991  Smokeless tobacco  . Not on file    History  Alcohol Use No    Family History  Problem Relation Age of Onset  . Alzheimer's disease Mother   . Coronary artery disease Father   . Prostate cancer Father     Review of Systems: Constitutional: no fever chills diaphoresis or fatigue or change in weight.  Head and neck: no hearing loss, no epistaxis, no photophobia or visual disturbance. Respiratory: No cough, shortness of breath or wheezing. Cardiovascular: No chest pain peripheral edema, palpitations. Gastrointestinal: No abdominal distention, no abdominal pain, no change in bowel habits hematochezia or melena. Genitourinary: No dysuria, no frequency, no urgency, no nocturia. Musculoskeletal:No arthralgias, no back pain, no gait disturbance or myalgias. Neurological: No dizziness, no headaches, no numbness, no seizures, no syncope, no weakness, no tremors. Hematologic:  No lymphadenopathy, no easy bruising. Psychiatric: No confusion, no hallucinations, no sleep disturbance.    Physical Exam: Filed Vitals:   08/08/11 1330  BP: 120/70  Pulse: 66  The general appearance reveals a well-developed well-nourished gentleman in no distress.Pupils equal and reactive.   Extraocular Movements are full.  There is no scleral icterus.  The mouth and pharynx are normal.  The neck is supple.  The carotids reveal no bruits.  The jugular venous pressure is normal.  The thyroid is not enlarged.  There is no lymphadenopathy.  The chest is clear to percussion and auscultation. There are no rales or rhonchi. Expansion of the chest is symmetrical.  The precordium is quiet.  The first heart sound is normal.  The second heart sound is physiologically split.  There is no murmur gallop rub or click.   There is no abnormal lift or heave.  The abdomen is soft and nontender. Bowel sounds are normal. The liver and spleen are not enlarged. There Are no abdominal masses. There are no bruits. The pedal pulses are good.  There is no phlebitis or edema.  There is no cyanosis or clubbing.  No nystagmus  The skin is warm and dry.  There is no rash.     Assessment / Plan: Continue same medication except decreased Diovan HCT to one half tablet daily.  Recheck in 6 months for followup office visit and EKG

## 2011-10-08 LAB — DIFFERENTIAL
Eosinophils Absolute: 0.2
Eosinophils Relative: 3
Lymphocytes Relative: 25
Lymphs Abs: 1.4
Monocytes Absolute: 0.4
Monocytes Relative: 7
Neutro Abs: 3.7
Neutrophils Relative %: 65

## 2011-10-08 LAB — COMPREHENSIVE METABOLIC PANEL
ALT: 50
Albumin: 3.4 — ABNORMAL LOW
Alkaline Phosphatase: 74
BUN: 14
Calcium: 8.9
Creatinine, Ser: 0.99
GFR calc non Af Amer: >60
Glucose, Bld: 109 — ABNORMAL HIGH
Sodium: 143
Total Protein: 6.3

## 2011-10-08 LAB — CBC
HCT: 38.7 — ABNORMAL LOW
Hemoglobin: 13.4
MCV: 89.5
RDW: 14
WBC: 5.7

## 2011-12-24 ENCOUNTER — Telehealth: Payer: Self-pay | Admitting: Cardiology

## 2011-12-24 ENCOUNTER — Other Ambulatory Visit: Payer: Self-pay

## 2011-12-24 MED ORDER — NITROGLYCERIN 0.4 MG SL SUBL: 0.4000 mg | SUBLINGUAL_TABLET | SUBLINGUAL | Status: DC | PRN

## 2011-12-24 NOTE — Telephone Encounter (Signed)
Pt was notifed.

## 2011-12-24 NOTE — Telephone Encounter (Signed)
New refill Pt wants refill of nitroglyerin called to [facility]

## 2012-01-05 ENCOUNTER — Other Ambulatory Visit: Payer: Self-pay | Admitting: *Deleted

## 2012-01-05 DIAGNOSIS — F419 Anxiety disorder, unspecified: Secondary | ICD-10-CM

## 2012-01-05 MED ORDER — ALPRAZOLAM 0.5 MG PO TABS: 0.2500 mg | ORAL_TABLET | Freq: Three times a day (TID) | ORAL | Status: DC

## 2012-01-05 NOTE — Telephone Encounter (Signed)
Refill on alprazolam

## 2012-02-04 ENCOUNTER — Encounter: Payer: Self-pay | Admitting: Cardiology

## 2012-02-04 ENCOUNTER — Ambulatory Visit (INDEPENDENT_AMBULATORY_CARE_PROVIDER_SITE_OTHER): Payer: Medicare Other | Admitting: Cardiology

## 2012-02-04 ENCOUNTER — Other Ambulatory Visit: Payer: Medicare Other | Admitting: *Deleted

## 2012-02-04 VITALS — BP 118/68 | HR 66 | Ht 72.0 in | Wt 201.0 lb

## 2012-02-04 DIAGNOSIS — E785 Hyperlipidemia, unspecified: Secondary | ICD-10-CM

## 2012-02-04 DIAGNOSIS — Z951 Presence of aortocoronary bypass graft: Secondary | ICD-10-CM

## 2012-02-04 DIAGNOSIS — M109 Gout, unspecified: Secondary | ICD-10-CM

## 2012-02-04 DIAGNOSIS — I119 Hypertensive heart disease without heart failure: Secondary | ICD-10-CM | POA: Insufficient documentation

## 2012-02-04 LAB — HEPATIC FUNCTION PANEL
ALT: 34 U/L (ref 0–53)
AST: 34 U/L (ref 0–37)
Albumin: 3.9 g/dL (ref 3.5–5.2)
Alkaline Phosphatase: 62 U/L (ref 39–117)
Bilirubin, Direct: 0.1 mg/dL (ref 0.0–0.3)
Total Bilirubin: 0.5 mg/dL (ref 0.3–1.2)
Total Protein: 6.9 g/dL (ref 6.0–8.3)

## 2012-02-04 LAB — BASIC METABOLIC PANEL
CO2: 26 mEq/L (ref 19–32)
Calcium: 9 mg/dL (ref 8.4–10.5)
Creatinine, Ser: 1.1 mg/dL (ref 0.4–1.5)
GFR: 72.53 mL/min (ref >60.00)
Glucose, Bld: 89 mg/dL (ref 70–99)

## 2012-02-04 LAB — LIPID PANEL
HDL: 36.5 mg/dL — ABNORMAL LOW (ref >39.00)
Total CHOL/HDL Ratio: 5

## 2012-02-04 NOTE — Progress Notes (Signed)
Steven Cameron Date of Birth:  Feb 02, 1935 [facility] [address] [place], [place]  [address] [phone]         Fax   [phone]  History of Present Illness: This pleasant 77 year old gentleman is seen for a month followup office visit.  He has a history of ischemic heart disease and this status post CABG in 2004.  He has a history of hypercholesterolemia and essential hypertension.  His last visit she's been doing well with no new cardiac symptoms.  Other problems include a peripheral neuropathy that a neurologist stated was possibly secondary to previous colchicine exposure.  Current Outpatient Prescriptions  Medication Sig Dispense Refill  . allopurinol (ZYLOPRIM) 100 MG tablet Take 100 mg by mouth 2 (two) times daily.       Marland Kitchen ALPRAZolam (XANAX) 0.5 MG tablet Take 0.25 mg by mouth at bedtime as needed.      Marland Kitchen aspirin 81 MG EC tablet Take 81 mg by mouth daily.        . clopidogrel (PLAVIX) 75 MG tablet Take 75 mg by mouth daily.        . Cyanocobalamin (VITAMIN B 12 PO) Take by mouth daily.      . fish oil-omega-3 fatty acids 1000 MG capsule Take 2 g by mouth daily.        Marland Kitchen FLUoxetine (PROZAC) 10 MG capsule Take 10 mg by mouth daily.        Marland Kitchen lovastatin (MEVACOR) 10 MG tablet Take 10 mg by mouth at bedtime.        . Misc Natural Products (OSTEO BI-FLEX JOINT SHIELD PO) Take by mouth 2 (two) times daily.      . Multiple Vitamins-Minerals (OSTEO COMPLEX PO) Take by mouth 2 (two) times daily.        . nitroGLYCERIN (NITROSTAT) 0.4 MG SL tablet Place 1 tablet (0.4 mg total) under the tongue every 5 (five) minutes as needed for chest pain.  25 tablet  3  . omeprazole (PRILOSEC) 20 MG capsule Take 40 mg by mouth daily.        . Red Yeast Rice Extract (RED YEAST RICE PO) Take by mouth 2 (two) times daily.       . valsartan-hydrochlorothiazide (DIOVAN-HCT) 80-12.5 MG per tablet Take just half a diovan -HCT each day        Allergies  Allergen Reactions  .  Ibuprofen   . Statins     Patient Active Problem List  Diagnoses  . Dizziness - light-headed  . Hx of CABG  . Dyslipidemia    History  Smoking status  . Former Smoker  . Quit date: 08/01/1991  Smokeless tobacco  . Not on file    History  Alcohol Use No    Family History  Problem Relation Age of Onset  . Alzheimer's disease Mother   . Coronary artery disease Father   . Prostate cancer Father     Review of Systems: Constitutional: no fever chills diaphoresis or fatigue or change in weight.  Head and neck: no hearing loss, no epistaxis, no photophobia or visual disturbance. Respiratory: No cough, shortness of breath or wheezing. Cardiovascular: No chest pain peripheral edema, palpitations. Gastrointestinal: No abdominal distention, no abdominal pain, no change in bowel habits hematochezia or melena. Genitourinary: No dysuria, no frequency, no urgency, no nocturia. Musculoskeletal:No arthralgias, no back pain, no gait disturbance or myalgias. Neurological: No dizziness, no headaches, no numbness, no seizures, no syncope, no weakness, no tremors. Hematologic: No  lymphadenopathy, no easy bruising. Psychiatric: No confusion, no hallucinations, no sleep disturbance.    Physical Exam: Filed Vitals:   02/04/12 1049  BP: 118/68  Pulse: 66   the general appearance reveals a well-developed well-nourished gentleman in no distress.The head and neck exam reveals pupils equal and reactive.  Extraocular movements are full.  There is no scleral icterus.  The mouth and pharynx are normal.  The neck is supple.  The carotids reveal no bruits.  The jugular venous pressure is normal.  The  thyroid is not enlarged.  There is no lymphadenopathy.  The chest is clear to percussion and auscultation.  There are no rales or rhonchi.  Expansion of the chest is symmetrical.  The precordium is quiet.  The first heart sound is normal.  The second heart sound is physiologically split.  There is no  murmur gallop rub or click.  There is no abnormal lift or heave.  The abdomen is soft and nontender.  The bowel sounds are normal.  The liver and spleen are not enlarged.  There are no abdominal masses.  There are no abdominal bruits.  Extremities reveal good pedal pulses.  There is no phlebitis or edema.  There is no cyanosis or clubbing.  Strength is normal and symmetrical in all extremities.  There is no lateralizing weakness.  There are no sensory deficits.  The skin is warm and dry.  There is no rash.     Assessment / Plan: Continue same medication.  Recheck in 6 months for followup office visit EKG and fasting lab work.

## 2012-02-04 NOTE — Patient Instructions (Signed)
Your physician recommends that you continue on your current medications as directed. Please refer to the Current Medication list given to you today.  Your physician wants you to follow-up in: 6 months. You will receive a reminder letter in the mail two months in advance. If you don't receive a letter, please call our office to schedule the follow-up appointment.  

## 2012-02-04 NOTE — Assessment & Plan Note (Signed)
The patient has a history of essential hypertension.  He has not been expressing any headaches.  He does have a history of dizziness related to change in position but does not have orthostatic hypotension.  He has been told that he has a problem similar to vertigo.

## 2012-02-04 NOTE — Assessment & Plan Note (Signed)
The patient is physically active.  He has not been experiencing any exertional chest pain or palpitations.

## 2012-02-04 NOTE — Assessment & Plan Note (Signed)
The patient has a history of dyslipidemia.  He is unable to tolerate Lipitor because of myalgias but is able to tolerate low-dose lovastatin.  Blood work is pending.

## 2012-02-06 ENCOUNTER — Telehealth: Payer: Self-pay | Admitting: *Deleted

## 2012-02-06 NOTE — Telephone Encounter (Signed)
Advised of labs 

## 2012-02-06 NOTE — Telephone Encounter (Signed)
Message copied by Earvin Hansen on Fri Feb 06, 2012  1:36 PM ------      Message from: Darlin Coco      Created: Wed Feb 04, 2012  2:07 PM       Please report.  The labs are stable.  Continue same meds.  Continue careful diet.  LDL 112 slightly high---watch diet.

## 2012-03-01 ENCOUNTER — Other Ambulatory Visit: Payer: Self-pay | Admitting: *Deleted

## 2012-03-01 MED ORDER — ALLOPURINOL 100 MG PO TABS: 100.0000 mg | ORAL_TABLET | Freq: Two times a day (BID) | ORAL | Status: DC

## 2012-03-01 NOTE — Telephone Encounter (Signed)
Refilled allopurinol

## 2012-06-01 ENCOUNTER — Other Ambulatory Visit: Payer: Self-pay | Admitting: *Deleted

## 2012-06-01 MED ORDER — ALPRAZOLAM 0.5 MG PO TABS: 0.5000 mg | ORAL_TABLET | Freq: Three times a day (TID) | ORAL | Status: DC | PRN

## 2012-06-01 NOTE — Telephone Encounter (Signed)
XANAX VERIFIED WITH PHARMACY/ REFILL COMPLETED

## 2012-08-02 ENCOUNTER — Other Ambulatory Visit: Payer: Self-pay | Admitting: *Deleted

## 2012-08-02 DIAGNOSIS — F419 Anxiety disorder, unspecified: Secondary | ICD-10-CM

## 2012-08-02 MED ORDER — ALPRAZOLAM 0.5 MG PO TABS: 0.5000 mg | ORAL_TABLET | Freq: Three times a day (TID) | ORAL | Status: DC | PRN

## 2012-08-02 NOTE — Telephone Encounter (Signed)
Refilled alprazolam

## 2012-08-06 ENCOUNTER — Encounter: Payer: Self-pay | Admitting: Cardiology

## 2012-08-06 ENCOUNTER — Ambulatory Visit (INDEPENDENT_AMBULATORY_CARE_PROVIDER_SITE_OTHER): Payer: Medicare Other | Admitting: Cardiology

## 2012-08-06 VITALS — BP 128/78 | HR 57 | Ht 72.0 in | Wt 196.0 lb

## 2012-08-06 DIAGNOSIS — L309 Dermatitis, unspecified: Secondary | ICD-10-CM

## 2012-08-06 DIAGNOSIS — Z951 Presence of aortocoronary bypass graft: Secondary | ICD-10-CM

## 2012-08-06 DIAGNOSIS — R42 Dizziness and giddiness: Secondary | ICD-10-CM

## 2012-08-06 DIAGNOSIS — L259 Unspecified contact dermatitis, unspecified cause: Secondary | ICD-10-CM

## 2012-08-06 DIAGNOSIS — E785 Hyperlipidemia, unspecified: Secondary | ICD-10-CM

## 2012-08-06 DIAGNOSIS — I119 Hypertensive heart disease without heart failure: Secondary | ICD-10-CM

## 2012-08-06 LAB — HEPATIC FUNCTION PANEL
ALT: 46 U/L (ref 0–53)
Total Bilirubin: 1 mg/dL (ref 0.3–1.2)

## 2012-08-06 LAB — BASIC METABOLIC PANEL
GFR: 69.4 mL/min (ref >60.00)
Glucose, Bld: 95 mg/dL (ref 70–99)
Potassium: 4.1 mEq/L (ref 3.5–5.1)
Sodium: 138 mEq/L (ref 135–145)

## 2012-08-06 LAB — LIPID PANEL
HDL: 36.2 mg/dL — ABNORMAL LOW (ref >39.00)
Total CHOL/HDL Ratio: 4
VLDL: 24.6 mg/dL (ref 0.0–40.0)

## 2012-08-06 NOTE — Assessment & Plan Note (Signed)
The patient has a history of dyslipidemia.  He is on generic Mevacor which she is tolerating without side effects.  Blood work today is pending

## 2012-08-06 NOTE — Progress Notes (Signed)
Steven Cameron Date of Birth:  Jul 15, 1935 [facility] [address] [place], [place]  [address] [phone]         Fax   [phone]  History of Present Illness: This pleasant 77 year old gentleman is seen for a scheduled 6 month followup office visit.  He has a past history of ischemic heart disease.  He had coronary artery bypass graft surgery in 2004.  He also has a history of previous gout.  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  He now takes allopurinol and stent.  He recently got a bad case of chigger bites on his lower extremities.  Current Outpatient Prescriptions  Medication Sig Dispense Refill  . allopurinol (ZYLOPRIM) 100 MG tablet Take 1 tablet (100 mg total) by mouth 2 (two) times daily.  60 tablet  11  . ALPRAZolam (XANAX) 0.5 MG tablet Take 1 tablet (0.5 mg total) by mouth 3 (three) times daily as needed.  90 tablet  2  . aspirin 81 MG EC tablet Take 81 mg by mouth daily.        . clopidogrel (PLAVIX) 75 MG tablet Take 75 mg by mouth daily.        . Cyanocobalamin (VITAMIN B 12 PO) Take by mouth daily.      . fish oil-omega-3 fatty acids 1000 MG capsule Take 2 g by mouth daily.        Marland Kitchen lovastatin (MEVACOR) 10 MG tablet Take 10 mg by mouth at bedtime.        . Misc Natural Products (OSTEO BI-FLEX JOINT SHIELD PO) Take by mouth 2 (two) times daily.      . Multiple Vitamins-Minerals (OSTEO COMPLEX PO) Take by mouth 2 (two) times daily.        . nitroGLYCERIN (NITROSTAT) 0.4 MG SL tablet Place 1 tablet (0.4 mg total) under the tongue every 5 (five) minutes as needed for chest pain.  25 tablet  3  . omeprazole (PRILOSEC) 20 MG capsule Take 40 mg by mouth daily.        . Red Yeast Rice Extract (RED YEAST RICE PO) Take by mouth 2 (two) times daily.       . valsartan-hydrochlorothiazide (DIOVAN-HCT) 80-12.5 MG per tablet Take just half a diovan -HCT each day      . permethrin (ELIMITE) 5 % cream as directed.      .  triamcinolone (KENALOG) 0.025 % cream as directed.        Allergies  Allergen Reactions  . Ibuprofen   . Statins     Patient Active Problem List  Diagnosis  . Dizziness - light-headed  . Hx of CABG  . Dyslipidemia  . Benign hypertensive heart disease without heart failure    History  Smoking status  . Former Smoker  . Quit date: 08/01/1991  Smokeless tobacco  . Not on file    History  Alcohol Use No    Family History  Problem Relation Age of Onset  . Alzheimer's disease Mother   . Coronary artery disease Father   . Prostate cancer Father     Review of Systems: Constitutional: no fever chills diaphoresis or fatigue or change in weight.  Head and neck: no hearing loss, no epistaxis, no photophobia or visual disturbance. Respiratory: No cough, shortness of breath or wheezing. Cardiovascular: No chest pain peripheral edema, palpitations. Gastrointestinal: No abdominal distention, no abdominal pain, no change in bowel habits hematochezia or melena. Genitourinary: No dysuria, no  frequency, no urgency, no nocturia. Musculoskeletal:No arthralgias, no back pain, no gait disturbance or myalgias. Neurological: No dizziness, no headaches, no numbness, no seizures, no syncope, no weakness, no tremors. Hematologic: No lymphadenopathy, no easy bruising. Psychiatric: No confusion, no hallucinations, no sleep disturbance.    Physical Exam: Filed Vitals:   08/06/12 0844  BP: 128/78  Pulse: 57   the general appearance reveals a well-developed well-nourished gentleman in no distress.The head and neck exam reveals pupils equal and reactive.  Extraocular movements are full.  There is no scleral icterus.  The mouth and pharynx are normal.  The neck is supple.  The carotids reveal no bruits.  The jugular venous pressure is normal.  The  thyroid is not enlarged.  There is no lymphadenopathy.  The chest is clear to percussion and auscultation.  There are no rales or rhonchi.  Expansion  of the chest is symmetrical.  The precordium is quiet.  The first heart sound is normal.  The second heart sound is physiologically split.  There is no murmur gallop rub or click.  There is no abnormal lift or heave.  The abdomen is soft and nontender.  The bowel sounds are normal.  The liver and spleen are not enlarged.  There are no abdominal masses.  There are no abdominal bruits.  Extremities reveal good pedal pulses.  There is no phlebitis or edema.  There is no cyanosis or clubbing.  Strength is normal and symmetrical in all extremities.  There is no lateralizing weakness.  There are no sensory deficits.  The skin is warm and dry.  There is significant chigger bite rash on his lower extremities particularly the left ankle  EKG today shows sinus bradycardia and no ischemic changes  Assessment / Plan:  Continue same medication.  Recheck in 6 months for followup office visit and fasting lipid panel hepatic function panel and basal metabolic panel.

## 2012-08-06 NOTE — Assessment & Plan Note (Signed)
The patient has occasional brief transient lightheadedness if he stands up too quickly.  He is not having any symptoms of Mnire's disease or true vertigo at this time

## 2012-08-06 NOTE — Progress Notes (Signed)
Quick Note:  Please report to patient. The recent labs are stable. Continue same medication and careful diet. ______ 

## 2012-08-06 NOTE — Patient Instructions (Addendum)
Will obtain labs today and call you with the results  Your physician recommends that you continue on your current medications as directed. Please refer to the Current Medication list given to you today.  Your physician wants you to follow-up in: 6 months You will receive a reminder letter in the mail two months in advance. If you don't receive a letter, please call our office to schedule the follow-up appointment.

## 2012-08-06 NOTE — Assessment & Plan Note (Addendum)
The patient has not been expressing any recurrent angina pectoris.  Still works hard out in the yard and in the fields each day

## 2012-08-09 ENCOUNTER — Telehealth: Payer: Self-pay | Admitting: *Deleted

## 2012-08-09 NOTE — Telephone Encounter (Signed)
Advised patient of lab results  

## 2012-08-09 NOTE — Telephone Encounter (Signed)
Message copied by Earvin Hansen on Mon Aug 09, 2012  5:27 PM ------      Message from: Darlin Coco      Created: Fri Aug 06, 2012  1:34 PM       Please report to patient.  The recent labs are stable. Continue same medication and careful diet.

## 2012-11-24 ENCOUNTER — Other Ambulatory Visit: Payer: Self-pay

## 2012-11-24 DIAGNOSIS — F419 Anxiety disorder, unspecified: Secondary | ICD-10-CM

## 2012-11-24 NOTE — Telephone Encounter (Signed)
Okay to refill xanax.

## 2012-11-29 MED ORDER — ALPRAZOLAM 0.5 MG PO TABS: 0.5000 mg | ORAL_TABLET | Freq: Three times a day (TID) | ORAL | Status: DC | PRN

## 2013-01-24 ENCOUNTER — Emergency Department (HOSPITAL_COMMUNITY): Payer: Medicare Other

## 2013-01-24 ENCOUNTER — Encounter (HOSPITAL_COMMUNITY): Payer: Self-pay | Admitting: Family Medicine

## 2013-01-24 ENCOUNTER — Emergency Department (HOSPITAL_COMMUNITY)
Admission: EM | Admit: 2013-01-24 | Discharge: 2013-01-24 | Disposition: A | Discharge status: Home / Self Care | Payer: Medicare Other | Attending: Emergency Medicine | Admitting: Emergency Medicine

## 2013-01-24 DIAGNOSIS — Z8546 Personal history of malignant neoplasm of prostate: Secondary | ICD-10-CM | POA: Insufficient documentation

## 2013-01-24 DIAGNOSIS — I10 Essential (primary) hypertension: Secondary | ICD-10-CM | POA: Insufficient documentation

## 2013-01-24 DIAGNOSIS — E785 Hyperlipidemia, unspecified: Secondary | ICD-10-CM | POA: Insufficient documentation

## 2013-01-24 DIAGNOSIS — Z8739 Personal history of other diseases of the musculoskeletal system and connective tissue: Secondary | ICD-10-CM | POA: Insufficient documentation

## 2013-01-24 DIAGNOSIS — M545 Low back pain, unspecified: Secondary | ICD-10-CM | POA: Insufficient documentation

## 2013-01-24 DIAGNOSIS — Z8639 Personal history of other endocrine, nutritional and metabolic disease: Secondary | ICD-10-CM | POA: Insufficient documentation

## 2013-01-24 DIAGNOSIS — Z862 Personal history of diseases of the blood and blood-forming organs and certain disorders involving the immune mechanism: Secondary | ICD-10-CM | POA: Insufficient documentation

## 2013-01-24 DIAGNOSIS — Z8679 Personal history of other diseases of the circulatory system: Secondary | ICD-10-CM | POA: Insufficient documentation

## 2013-01-24 DIAGNOSIS — K219 Gastro-esophageal reflux disease without esophagitis: Secondary | ICD-10-CM | POA: Insufficient documentation

## 2013-01-24 DIAGNOSIS — Z8709 Personal history of other diseases of the respiratory system: Secondary | ICD-10-CM | POA: Insufficient documentation

## 2013-01-24 DIAGNOSIS — M549 Dorsalgia, unspecified: Secondary | ICD-10-CM

## 2013-01-24 DIAGNOSIS — Z79899 Other long term (current) drug therapy: Secondary | ICD-10-CM | POA: Insufficient documentation

## 2013-01-24 DIAGNOSIS — Z87891 Personal history of nicotine dependence: Secondary | ICD-10-CM | POA: Insufficient documentation

## 2013-01-24 DIAGNOSIS — Z7982 Long term (current) use of aspirin: Secondary | ICD-10-CM | POA: Insufficient documentation

## 2013-01-24 HISTORY — DX: Polyneuropathy, unspecified: G62.9

## 2013-01-24 LAB — CBC WITH DIFFERENTIAL/PLATELET
Basophils Relative: 0 % (ref 0–1)
HCT: 40.5 % (ref 39.0–52.0)
Hemoglobin: 14.7 g/dL (ref 13.0–17.0)
Lymphocytes Relative: 9 % — ABNORMAL LOW (ref 12–46)
Lymphs Abs: 0.7 10*3/uL (ref 0.7–4.0)
MCHC: 36.3 g/dL — ABNORMAL HIGH (ref 30.0–36.0)
Monocytes Relative: 4 % (ref 3–12)
Neutro Abs: 6.7 10*3/uL (ref 1.7–7.7)
Neutrophils Relative %: 87 % — ABNORMAL HIGH (ref 43–77)
RBC: 4.58 MIL/uL (ref 4.22–5.81)
WBC: 7.7 10*3/uL (ref 4.0–10.5)

## 2013-01-24 LAB — URINALYSIS, ROUTINE W REFLEX MICROSCOPIC
Glucose, UA: NEGATIVE mg/dL
Leukocytes, UA: NEGATIVE
Nitrite: NEGATIVE
Specific Gravity, Urine: 1.014 (ref 1.005–1.030)
pH: 7.5 (ref 5.0–8.0)

## 2013-01-24 LAB — COMPREHENSIVE METABOLIC PANEL
Albumin: 3.6 g/dL (ref 3.5–5.2)
Alkaline Phosphatase: 72 U/L (ref 39–117)
BUN: 15 mg/dL (ref 6–23)
CO2: 26 mEq/L (ref 19–32)
Chloride: 97 mEq/L (ref 96–112)
GFR calc non Af Amer: 81 mL/min — ABNORMAL LOW (ref >90)
Potassium: 3.9 mEq/L (ref 3.5–5.1)
Total Bilirubin: 0.3 mg/dL (ref 0.3–1.2)

## 2013-01-24 MED ORDER — HYDROCODONE-ACETAMINOPHEN 5-325 MG PO TABS
2.0000 | ORAL_TABLET | Freq: Once | ORAL | Status: AC
  Administered 2013-01-24: 2 via ORAL
  Filled 2013-01-24: qty 2

## 2013-01-24 MED ORDER — HYDROCODONE-ACETAMINOPHEN 5-325 MG PO TABS: 2.0000 | ORAL_TABLET | ORAL | Status: AC | PRN

## 2013-01-24 MED ORDER — HYDROCODONE-ACETAMINOPHEN 5-325 MG PO TABS: 2.0000 | ORAL_TABLET | Freq: Once | ORAL | Status: DC

## 2013-01-24 MED ORDER — ONDANSETRON HCL 4 MG/2ML IJ SOLN
4.0000 mg | Freq: Once | INTRAMUSCULAR | Status: AC
  Administered 2013-01-24: 4 mg via INTRAVENOUS
  Filled 2013-01-24: qty 2

## 2013-01-24 MED ORDER — HYDROMORPHONE HCL PF 1 MG/ML IJ SOLN
0.5000 mg | Freq: Once | INTRAMUSCULAR | Status: AC
  Administered 2013-01-24: 0.5 mg via INTRAVENOUS
  Filled 2013-01-24: qty 1

## 2013-01-24 NOTE — ED Notes (Signed)
Patient transported to X-ray

## 2013-01-24 NOTE — ED Provider Notes (Signed)
Medical screening examination/treatment/procedure(s) were conducted as a shared visit with non-physician practitioner(s) and myself.  I personally evaluated the patient during the encounter Pt c/o lower back pain, right superior sacral region. Constant. Dull. Worse w movement/position change. No injury. No radicular pain or numbness/weakness. Hx prostate ca. No hx ddd. No hx aaa. No abd or pelvic pain. No fever or chills. No mid to upper back pain. No cp or sob. Spine nt. abd soft nt, no pulse mass. Prior ct/mr with no aaa. No rash/shingles to area of pain - discussed if rash/lesions develop, would be c/w dx. No numbness/weakness. Xr. Pain control.   Mirna Mires, MD 01/24/13 [phone]

## 2013-01-24 NOTE — ED Notes (Signed)
Per EMS, pt sent here by his doctor to R/O AAA vs PE. sts some SOB and back pain that started today. Pt sts severe lower back pain and the SOB is normal. IV established

## 2013-01-24 NOTE — ED Provider Notes (Signed)
History     CSN: [id number]  Arrival date & time 01/24/13  1352   First MD Initiated Contact with Patient 01/24/13 1403      Chief Complaint  Patient presents with  . Back Pain    (Consider location/radiation/quality/duration/timing/severity/associated sxs/prior treatment) Patient is a 78 y.o. male presenting with back pain. The history is provided by the patient. No language interpreter was used.  Back Pain  This is a new problem. The current episode started yesterday. The problem occurs constantly. The problem has been gradually worsening. The pain is associated with no known injury. The pain is present in the lumbar spine. The quality of the pain is described as aching. The pain does not radiate. The pain is at a severity of 5/10. The pain is moderate. The symptoms are aggravated by bending. The pain is the same all the time. Stiffness is present all day.   Pt reports no new shortness of breath.  Pt reports his breathing feels the same to him.   Pt denies any chest pain, denies any abdominal pain.  Pt has had a history of prostate cancer.   Past Medical History  Diagnosis Date  . IHD (ischemic heart disease)   . Arthritis   . Gout   . Sinus congestion   . GERD (gastroesophageal reflux disease)   . Hypertension   . Hyperlipidemia     Past Surgical History  Procedure Date  . Cardiac catheterization 09/24/2004    EF 60%  . Coronary artery bypass graft 2004  . US echocardiography 01/19/2008    EF 55-60%  . Cardiovascular stress test 07/17/2010    EF 61%    Family History  Problem Relation Age of Onset  . Alzheimer's disease Mother   . Coronary artery disease Father   . Prostate cancer Father     History  Substance Use Topics  . Smoking status: Former Smoker    Quit date: 08/01/1991  . Smokeless tobacco: Not on file  . Alcohol Use: No      Review of Systems  Musculoskeletal: Positive for back pain.  All other systems reviewed and are negative.    Allergies    Ibuprofen and Statins  Home Medications   Current Outpatient Rx  Name  Route  Sig  Dispense  Refill  . ALLOPURINOL 100 MG PO TABS   Oral   Take 1 tablet (100 mg total) by mouth 2 (two) times daily.   60 tablet   11   . ALPRAZOLAM 0.5 MG PO TABS   Oral   Take 1 tablet (0.5 mg total) by mouth 3 (three) times daily as needed.   90 tablet   5     CALLED IN   . ASPIRIN 81 MG PO TBEC   Oral   Take 81 mg by mouth daily.           Marland Kitchen CLOPIDOGREL BISULFATE 75 MG PO TABS   Oral   Take 75 mg by mouth daily.           Marland Kitchen VITAMIN B 12 PO   Oral   Take by mouth daily.         . OMEGA-3 FATTY ACIDS 1000 MG PO CAPS   Oral   Take 2 g by mouth daily.           Marland Kitchen LOVASTATIN 10 MG PO TABS   Oral   Take 10 mg by mouth at bedtime.           Marland Kitchen  OSTEO BI-FLEX JOINT SHIELD PO   Oral   Take by mouth 2 (two) times daily.         Jefm Miles COMPLEX PO   Oral   Take by mouth 2 (two) times daily.           Marland Kitchen NITROGLYCERIN 0.4 MG SL SUBL   Sublingual   Place 1 tablet (0.4 mg total) under the tongue every 5 (five) minutes as needed for chest pain.   25 tablet   3   . OMEPRAZOLE 20 MG PO CPDR   Oral   Take 40 mg by mouth daily.           Marland Kitchen PERMETHRIN 5 % EX CREA      as directed.         . RED YEAST RICE PO   Oral   Take by mouth 2 (two) times daily.          . TRIAMCINOLONE ACETONIDE 0.025 % EX CREA      as directed.         Marland Kitchen VALSARTAN-HYDROCHLOROTHIAZIDE 80-12.5 MG PO TABS      Take just half a diovan -HCT each day           BP 154/66  Pulse 64  Temp 98 F (36.7 C)  Resp 18  SpO2 95%  Physical Exam  Nursing note and vitals reviewed. Constitutional: He appears well-developed and well-nourished.  HENT:  Head: Normocephalic.  Right Ear: External ear normal.  Left Ear: External ear normal.  Eyes: Conjunctivae normal and EOM are normal. Pupils are equal, round, and reactive to light.  Neck: Normal range of motion. Neck supple.   Cardiovascular: Normal rate.   Pulmonary/Chest: Effort normal.  Abdominal: Soft.  Musculoskeletal: Normal range of motion.  Neurological: He is alert.  Skin: Skin is warm.  Psychiatric: He has a normal mood and affect.    ED Course  Procedures (including critical care time)  Labs Reviewed - No data to display No results found.  Results for orders placed during the hospital encounter of 01/24/13  URINALYSIS, ROUTINE W REFLEX MICROSCOPIC      Component Value Range   Color, Urine YELLOW  YELLOW   APPearance CLEAR  CLEAR   Specific Gravity, Urine 1.014  1.005 - 1.030   pH 7.5  5.0 - 8.0   Glucose, UA NEGATIVE  NEGATIVE mg/dL   Hgb urine dipstick NEGATIVE  NEGATIVE   Bilirubin Urine NEGATIVE  NEGATIVE   Ketones, ur NEGATIVE  NEGATIVE mg/dL   Protein, ur NEGATIVE  NEGATIVE mg/dL   Urobilinogen, UA 0.2  0.0 - 1.0 mg/dL   Nitrite NEGATIVE  NEGATIVE   Leukocytes, UA NEGATIVE  NEGATIVE  CBC WITH DIFFERENTIAL      Component Value Range   WBC 7.7  4.0 - 10.5 K/uL   RBC 4.58  4.22 - 5.81 MIL/uL   Hemoglobin 14.7  13.0 - 17.0 g/dL   HCT 40.5  39.0 - 52.0 %   MCV 88.4  78.0 - 100.0 fL   MCH 32.1  26.0 - 34.0 pg   MCHC 36.3 (*) 30.0 - 36.0 g/dL   RDW 12.8  11.5 - 15.5 %   Platelets 158  150 - 400 K/uL   Neutrophils Relative 87 (*) 43 - 77 %   Neutro Abs 6.7  1.7 - 7.7 K/uL   Lymphocytes Relative 9 (*) 12 - 46 %   Lymphs Abs 0.7  0.7 - 4.0 K/uL  Monocytes Relative 4  3 - 12 %   Monocytes Absolute 0.3  0.1 - 1.0 K/uL   Eosinophils Relative 0  0 - 5 %   Eosinophils Absolute 0.0  0.0 - 0.7 K/uL   Basophils Relative 0  0 - 1 %   Basophils Absolute 0.0  0.0 - 0.1 K/uL  COMPREHENSIVE METABOLIC PANEL      Component Value Range   Sodium 134 (*) 135 - 145 mEq/L   Potassium 3.9  3.5 - 5.1 mEq/L   Chloride 97  96 - 112 mEq/L   CO2 26  19 - 32 mEq/L   Glucose, Bld 119 (*) 70 - 99 mg/dL   BUN 15  6 - 23 mg/dL   Creatinine, Ser 0.90  0.50 - 1.35 mg/dL   Calcium 9.3  8.4 - 10.5 mg/dL    Total Protein 6.9  6.0 - 8.3 g/dL   Albumin 3.6  3.5 - 5.2 g/dL   AST 25  0 - 37 U/L   ALT 29  0 - 53 U/L   Alkaline Phosphatase 72  39 - 117 U/L   Total Bilirubin 0.3  0.3 - 1.2 mg/dL   GFR calc non Af Amer 81 (*) >90 mL/min   GFR calc Af Amer >90  >90 mL/min   Dg Lumbar Spine Complete  01/24/2013  *RADIOLOGY REPORT*  Clinical Data: Severe low back pain  LUMBAR SPINE - COMPLETE 4+ VIEW  Comparison: CT abdomen pelvis dated 11/21/2009  Findings: Five lumbar-type vertebral bodies.  Normal lumbar lordosis.  No evidence of fracture or dislocation.  Vertebral body heights are maintained.  Mild to moderate multilevel degenerative changes, most prominent at L4-5.  Vascular calcifications.  IMPRESSION: No fracture or dislocation is seen.  Mild to moderate degenerative changes.   Original Report Authenticated By: Julian Hy, M.D.     1. Back pain       MDM  Dr. Ashok Cordia in to see and examine pt.   Pt given dilaudid for pain.   LS spine degenerative changes,   Labs normal,  Urine normal.   Pt given 2 hydrocodone here.   Pt reports pain is improved.   I discussed results with pt and family.   I doubt pe.  Pt had mRi DEC 2010 of abdomen no evidence of AAA.   Pt given rx for hydrocodone.        [place], [place] 01/24/13 1824

## 2013-01-30 ENCOUNTER — Emergency Department (HOSPITAL_COMMUNITY)
Admission: EM | Admit: 2013-01-30 | Discharge: 2013-01-30 | Disposition: A | Discharge status: Home / Self Care | Payer: Medicare Other | Attending: Emergency Medicine | Admitting: Emergency Medicine

## 2013-01-30 ENCOUNTER — Emergency Department (HOSPITAL_COMMUNITY): Payer: Medicare Other

## 2013-01-30 ENCOUNTER — Encounter (HOSPITAL_COMMUNITY): Payer: Self-pay | Admitting: Emergency Medicine

## 2013-01-30 DIAGNOSIS — Z8709 Personal history of other diseases of the respiratory system: Secondary | ICD-10-CM | POA: Insufficient documentation

## 2013-01-30 DIAGNOSIS — Z79899 Other long term (current) drug therapy: Secondary | ICD-10-CM | POA: Insufficient documentation

## 2013-01-30 DIAGNOSIS — I10 Essential (primary) hypertension: Secondary | ICD-10-CM | POA: Insufficient documentation

## 2013-01-30 DIAGNOSIS — Z7982 Long term (current) use of aspirin: Secondary | ICD-10-CM | POA: Insufficient documentation

## 2013-01-30 DIAGNOSIS — I259 Chronic ischemic heart disease, unspecified: Secondary | ICD-10-CM | POA: Insufficient documentation

## 2013-01-30 DIAGNOSIS — M1610 Unilateral primary osteoarthritis, unspecified hip: Secondary | ICD-10-CM | POA: Insufficient documentation

## 2013-01-30 DIAGNOSIS — Z87891 Personal history of nicotine dependence: Secondary | ICD-10-CM | POA: Insufficient documentation

## 2013-01-30 DIAGNOSIS — M1990 Unspecified osteoarthritis, unspecified site: Secondary | ICD-10-CM

## 2013-01-30 DIAGNOSIS — Z8669 Personal history of other diseases of the nervous system and sense organs: Secondary | ICD-10-CM | POA: Insufficient documentation

## 2013-01-30 DIAGNOSIS — Z862 Personal history of diseases of the blood and blood-forming organs and certain disorders involving the immune mechanism: Secondary | ICD-10-CM | POA: Insufficient documentation

## 2013-01-30 DIAGNOSIS — K219 Gastro-esophageal reflux disease without esophagitis: Secondary | ICD-10-CM | POA: Insufficient documentation

## 2013-01-30 DIAGNOSIS — M25559 Pain in unspecified hip: Secondary | ICD-10-CM | POA: Insufficient documentation

## 2013-01-30 DIAGNOSIS — E785 Hyperlipidemia, unspecified: Secondary | ICD-10-CM | POA: Insufficient documentation

## 2013-01-30 DIAGNOSIS — Z8639 Personal history of other endocrine, nutritional and metabolic disease: Secondary | ICD-10-CM | POA: Insufficient documentation

## 2013-01-30 DIAGNOSIS — R350 Frequency of micturition: Secondary | ICD-10-CM | POA: Insufficient documentation

## 2013-01-30 LAB — URINALYSIS, ROUTINE W REFLEX MICROSCOPIC
Bilirubin Urine: NEGATIVE
Ketones, ur: NEGATIVE mg/dL
Nitrite: NEGATIVE
Urobilinogen, UA: 0.2 mg/dL (ref 0.0–1.0)

## 2013-01-30 MED ORDER — POLYETHYLENE GLYCOL 3350 17 GM/SCOOP PO POWD: 17.0000 g | Freq: Every day | ORAL | Status: DC

## 2013-01-30 MED ORDER — OXYCODONE-ACETAMINOPHEN 5-325 MG PO TABS: 1.0000 | ORAL_TABLET | Freq: Four times a day (QID) | ORAL | Status: DC | PRN

## 2013-01-30 MED ORDER — ONDANSETRON 4 MG PO TBDP
8.0000 mg | ORAL_TABLET | Freq: Once | ORAL | Status: AC
  Administered 2013-01-30: 8 mg via ORAL
  Filled 2013-01-30 (×2): qty 1

## 2013-01-30 MED ORDER — HYDROMORPHONE HCL PF 1 MG/ML IJ SOLN
1.0000 mg | Freq: Once | INTRAMUSCULAR | Status: AC
  Administered 2013-01-30: 1 mg via INTRAMUSCULAR
  Filled 2013-01-30: qty 1

## 2013-01-30 MED ORDER — OXYCODONE-ACETAMINOPHEN 5-325 MG PO TABS
1.0000 | ORAL_TABLET | Freq: Once | ORAL | Status: AC
  Administered 2013-01-30: 1 via ORAL
  Filled 2013-01-30: qty 1

## 2013-01-30 NOTE — ED Notes (Signed)
Patient complaining of right hip pain; patient reports that he was seen here and given prescription pain medication -- patient reports that prescriptions for pain only helps for about an hour after taking.  Denie recent injury.

## 2013-01-30 NOTE — ED Notes (Signed)
Patient made aware of needing the urine speciment

## 2013-01-30 NOTE — ED Provider Notes (Signed)
History     CSN: [id number]  Arrival date & time 01/30/13  0059   First MD Initiated Contact with Patient 01/30/13 0112      Chief Complaint  Patient presents with  . Hip Pain   HPI  History provided by the patient and significant other. Patient is a 78 year old male with history of hypertension, hyperlipidemia, gout, and prostate cancer status post radiation who presents with complaints of persistent and worsened right hip pain. Patient has been having hip and some low back pains for the past several days. Patient was seen and evaluated on chamber 27th for similar symptoms. At that time he had x-rays performed of his lower back and was provided pain medications. He has been using prescription for hydrocodone but this has not helped significantly today. Patient denies any injury or trauma to the hip or leg. Pain does not radiate significantly and primary stays in the right hip. It is worse with movements of the hip sometimes better at rest. Pain has been constant. Patient denies any other aggravating or alleviating factors. Denies any other associated symptoms. Denies any hematuria, flank pain or dysuria. Denies any fever, chills or sweats. Denies any urinary or fecal incontinence, urinary retention or perineal numbness.    Past Medical History  Diagnosis Date  . IHD (ischemic heart disease)   . Arthritis   . Gout   . Sinus congestion   . GERD (gastroesophageal reflux disease)   . Hypertension   . Hyperlipidemia   . Neuropathy     In both legs, below knee    Past Surgical History  Procedure Date  . Cardiac catheterization 09/24/2004    EF 60%  . Coronary artery bypass graft 2004  . US echocardiography 01/19/2008    EF 55-60%  . Cardiovascular stress test 07/17/2010    EF 61%    Family History  Problem Relation Age of Onset  . Alzheimer's disease Mother   . Coronary artery disease Father   . Prostate cancer Father     History  Substance Use Topics  . Smoking status:  Former Smoker    Quit date: 08/01/1991  . Smokeless tobacco: Not on file  . Alcohol Use: No      Review of Systems  Constitutional: Negative for fever and chills.  Genitourinary: Positive for frequency. Negative for dysuria, hematuria and flank pain.  Musculoskeletal: Positive for back pain.       Right hip pain  Neurological: Negative for weakness and numbness.  All other systems reviewed and are negative.    Allergies  Statins and Ibuprofen  Home Medications   Current Outpatient Rx  Name  Route  Sig  Dispense  Refill  . ALLOPURINOL 100 MG PO TABS   Oral   Take 1 tablet (100 mg total) by mouth 2 (two) times daily.   60 tablet   11   . ALPRAZOLAM 0.5 MG PO TABS   Oral   Take 1 tablet (0.5 mg total) by mouth 3 (three) times daily as needed.   90 tablet   5     CALLED IN   . ASPIRIN 81 MG PO TBEC   Oral   Take 81 mg by mouth daily.           . SUPER B COMPLEX PO   Oral   Take 1 tablet by mouth daily.         Marland Kitchen CLOPIDOGREL BISULFATE 75 MG PO TABS   Oral   Take 75 mg  by mouth at bedtime.          Marland Kitchen HYDROCODONE-ACETAMINOPHEN 5-325 MG PO TABS   Oral   Take 2 tablets by mouth every 4 (four) hours as needed for pain.   20 tablet   0   . LOVASTATIN 10 MG PO TABS   Oral   Take 10 mg by mouth at bedtime.          Jefm Miles BI-FLEX JOINT SHIELD PO   Oral   Take 1 tablet by mouth 2 (two) times daily.          . OMEGA-3-ACID ETHYL ESTERS 1 G PO CAPS   Oral   Take 1 g by mouth 3 (three) times daily.         Marland Kitchen OMEPRAZOLE 20 MG PO CPDR   Oral   Take 20 mg by mouth 2 (two) times daily.         . RED YEAST RICE 600 MG PO CAPS   Oral   Take 600 mg by mouth 2 (two) times daily.         Marland Kitchen VALSARTAN-HYDROCHLOROTHIAZIDE 80-12.5 MG PO TABS   Oral   Take 0.5 tablets by mouth daily.         Marland Kitchen NITROGLYCERIN 0.4 MG SL SUBL   Sublingual   Place 0.4 mg under the tongue every 5 (five) minutes as needed. For chest pain           BP 140/111   Pulse 66  Temp 97.6 F (36.4 C) (Oral)  Resp 18  SpO2 95%  Physical Exam  Nursing note and vitals reviewed. Constitutional: He is oriented to person, place, and time. He appears well-developed and well-nourished. No distress.  HENT:  Head: Normocephalic.  Eyes: Conjunctivae normal are normal.  Neck: Normal range of motion. Neck supple.  Cardiovascular: Normal rate and regular rhythm.   Pulmonary/Chest: Effort normal and breath sounds normal.  Abdominal: Soft.  Musculoskeletal: He exhibits tenderness.       Reduced range of motion of right hip secondary to pain. Patient has a limp favoring the right side. No gross deformities. No shortening of the leg or rotation. Normal distal sensation and pulses in feet.  Neurological: He is alert and oriented to person, place, and time.  Skin: Skin is warm.  Psychiatric: He has a normal mood and affect. His behavior is normal.    ED Course  Procedures   Results for orders placed during the hospital encounter of 01/30/13  URINALYSIS, ROUTINE W REFLEX MICROSCOPIC      Component Value Range   Color, Urine YELLOW  YELLOW   APPearance CLEAR  CLEAR   Specific Gravity, Urine 1.019  1.005 - 1.030   pH 6.0  5.0 - 8.0   Glucose, UA NEGATIVE  NEGATIVE mg/dL   Hgb urine dipstick NEGATIVE  NEGATIVE   Bilirubin Urine NEGATIVE  NEGATIVE   Ketones, ur NEGATIVE  NEGATIVE mg/dL   Protein, ur NEGATIVE  NEGATIVE mg/dL   Urobilinogen, UA 0.2  0.0 - 1.0 mg/dL   Nitrite NEGATIVE  NEGATIVE   Leukocytes, UA NEGATIVE  NEGATIVE  ]    Dg Hip Complete Right  01/30/2013  *RADIOLOGY REPORT*  Clinical Data: Right hip pain.  RIGHT HIP - COMPLETE 2+ VIEW  Comparison: None.  Findings: No evidence of right hip fracture or dislocation.  No evidence of acetabular pelvic fracture.  Mild to moderate right hip osteoarthritis is seen.  Several brachytherapy seeds are seen in the region  of the prostate.  IMPRESSION:  1.  No acute findings. 2.  Right hip osteoarthritis.    Original Report Authenticated By: Earle Gell, M.D.      1. Hip pain   2. Arthritis       MDM  Patient seen and evaluated. Patient appears in moderate discomfort but no acute distress. Patient was seen recently for similar complaints with x-rays of his lower spine. Patient complaining of pain directly in the right hip area. No further radiation down the leg. Will obtain x-rays of the hip and provide pain medication.   Patient reports significant improvements after medications. He has been up and ambulating without assistance. X-rays show arthritic changes but no other concerning findings. No signs of osseous lesions or fractures. At this time will have patient return home and followup with PCP.     [place]ie [place], [place] 01/30/13 [phone]

## 2013-01-30 NOTE — ED Provider Notes (Signed)
Medical screening examination/treatment/procedure(s) were performed by non-physician practitioner and as supervising physician I was immediately available for consultation/collaboration.   Alfonzo Feller, DO 01/30/13 1929

## 2013-01-31 ENCOUNTER — Other Ambulatory Visit (INDEPENDENT_AMBULATORY_CARE_PROVIDER_SITE_OTHER): Payer: Medicare Other

## 2013-01-31 DIAGNOSIS — E785 Hyperlipidemia, unspecified: Secondary | ICD-10-CM

## 2013-01-31 DIAGNOSIS — I119 Hypertensive heart disease without heart failure: Secondary | ICD-10-CM

## 2013-01-31 LAB — BASIC METABOLIC PANEL
CO2: 32 mEq/L (ref 19–32)
Calcium: 9.2 mg/dL (ref 8.4–10.5)
Chloride: 99 mEq/L (ref 96–112)
Creatinine, Ser: 1.2 mg/dL (ref 0.4–1.5)
Glucose, Bld: 98 mg/dL (ref 70–99)
Sodium: 137 mEq/L (ref 135–145)

## 2013-01-31 LAB — LIPID PANEL: Total CHOL/HDL Ratio: 4

## 2013-01-31 LAB — HEPATIC FUNCTION PANEL
AST: 21 U/L (ref 0–37)
Alkaline Phosphatase: 62 U/L (ref 39–117)
Total Bilirubin: 0.7 mg/dL (ref 0.3–1.2)

## 2013-01-31 NOTE — Progress Notes (Signed)
Quick Note:  Please make copy of labs for patient visit. ______ 

## 2013-02-04 ENCOUNTER — Ambulatory Visit (INDEPENDENT_AMBULATORY_CARE_PROVIDER_SITE_OTHER): Payer: Medicare Other | Admitting: Cardiology

## 2013-02-04 ENCOUNTER — Encounter: Payer: Self-pay | Admitting: Cardiology

## 2013-02-04 VITALS — BP 130/70 | HR 67 | Resp 18 | Ht 72.0 in | Wt 197.4 lb

## 2013-02-04 DIAGNOSIS — E7800 Pure hypercholesterolemia, unspecified: Secondary | ICD-10-CM

## 2013-02-04 DIAGNOSIS — M1990 Unspecified osteoarthritis, unspecified site: Secondary | ICD-10-CM

## 2013-02-04 DIAGNOSIS — Z951 Presence of aortocoronary bypass graft: Secondary | ICD-10-CM

## 2013-02-04 DIAGNOSIS — G629 Polyneuropathy, unspecified: Secondary | ICD-10-CM | POA: Insufficient documentation

## 2013-02-04 DIAGNOSIS — G589 Mononeuropathy, unspecified: Secondary | ICD-10-CM

## 2013-02-04 NOTE — Progress Notes (Signed)
Allegra Lai Date of Birth:  May 20, 1935 [facility] [address] [place], [place]  [address] [phone]         Fax   [phone]  History of Present Illness: This pleasant 78 year old gentleman is seen for a scheduled 6 month followup office visit. He has a past history of ischemic heart disease. He had coronary artery bypass graft surgery in 2004. He also has a history of previous gout. 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  He saw Dr. Jannifer Franklin concerning this about 2 or 3 years ago. He now takes allopurinol .  Recently the patient has been having severe intractable pain in his right hip   Current Outpatient Prescriptions  Medication Sig Dispense Refill  . allopurinol (ZYLOPRIM) 100 MG tablet Take 1 tablet (100 mg total) by mouth 2 (two) times daily.  60 tablet  11  . ALPRAZolam (XANAX) 0.5 MG tablet Take 1 tablet (0.5 mg total) by mouth 3 (three) times daily as needed.  90 tablet  5  . aspirin 81 MG EC tablet Take 81 mg by mouth daily.        . B Complex-C (SUPER B COMPLEX PO) Take 1 tablet by mouth daily.      . clopidogrel (PLAVIX) 75 MG tablet Take 75 mg by mouth at bedtime.       . lovastatin (MEVACOR) 10 MG tablet Take 10 mg by mouth at bedtime.       . meloxicam (MOBIC) 7.5 MG tablet Take 7.5 mg by mouth daily.      . Misc Natural Products (OSTEO BI-FLEX JOINT SHIELD PO) Take 1 tablet by mouth 2 (two) times daily.       . nitroGLYCERIN (NITROSTAT) 0.4 MG SL tablet Place 0.4 mg under the tongue every 5 (five) minutes as needed. For chest pain      . omega-3 acid ethyl esters (LOVAZA) 1 G capsule Take 1 g by mouth 3 (three) times daily.      Marland Kitchen omeprazole (PRILOSEC) 20 MG capsule Take 20 mg by mouth 2 (two) times daily.      Marland Kitchen oxyCODONE-acetaminophen (PERCOCET/ROXICET) 5-325 MG per tablet Take 1-2 tablets by mouth every 6 (six) hours as needed for pain.  20 tablet  0  . polyethylene glycol powder (GLYCOLAX/MIRALAX) powder Take  17 g by mouth daily.  255 g  0  . Red Yeast Rice 600 MG CAPS Take 600 mg by mouth 2 (two) times daily.      . valsartan-hydrochlorothiazide (DIOVAN-HCT) 80-12.5 MG per tablet Take 0.5 tablets by mouth daily.        Allergies  Allergen Reactions  . Statins Other (See Comments)    Weakness of legs  . Ibuprofen Rash    Patient Active Problem List  Diagnosis  . Dizziness - light-headed  . Hx of CABG  . Dyslipidemia  . Benign hypertensive heart disease without heart failure    History  Smoking status  . Former Smoker  . Quit date: 08/01/1991  Smokeless tobacco  . Not on file    History  Alcohol Use No    Family History  Problem Relation Age of Onset  . Alzheimer's disease Mother   . Coronary artery disease Father   . Prostate cancer Father     Review of Systems: Constitutional: no fever chills diaphoresis or fatigue or change in weight.  Head and neck: no hearing loss, no epistaxis, no photophobia or visual disturbance. Respiratory: No  cough, shortness of breath or wheezing. Cardiovascular: No chest pain peripheral edema, palpitations. Gastrointestinal: No abdominal distention, no abdominal pain, no change in bowel habits hematochezia or melena. Genitourinary: No dysuria, no frequency, no urgency, no nocturia. Musculoskeletal:No arthralgias, no back pain, no gait disturbance or myalgias. Neurological: No dizziness, no headaches, no numbness, no seizures, no syncope, no weakness, no tremors. Hematologic: No lymphadenopathy, no easy bruising. Psychiatric: No confusion, no hallucinations, no sleep disturbance.    Physical Exam: Filed Vitals:   02/04/13 1444  BP: 130/70  Pulse: 67  Resp: 18   the general appearance reveals a well-developed well-nourished gentleman in no distress.The head and neck exam reveals pupils equal and reactive.  Extraocular movements are full.  There is no scleral icterus.  The mouth and pharynx are normal.  The neck is supple.  The carotids  reveal no bruits.  The jugular venous pressure is normal.  The  thyroid is not enlarged.  There is no lymphadenopathy.  The chest is clear to percussion and auscultation.  There are no rales or rhonchi.  Expansion of the chest is symmetrical.  The precordium is quiet.  The first heart sound is normal.  The second heart sound is physiologically split.  There is no murmur gallop rub or click.  There is no abnormal lift or heave.  The abdomen is soft and nontender.  The bowel sounds are normal.  The liver and spleen are not enlarged.  There are no abdominal masses.  There are no abdominal bruits.  Extremities reveal good pedal pulses.  There is no phlebitis or edema.  There is no cyanosis or clubbing.  Strength is normal and symmetrical in all extremities.  There is no lateralizing weakness.  There are no sensory deficits.  The skin is warm and dry.  There is no rash.     Assessment / Plan: The patient is to continue current medication.  We are referring him to Dr. Gladstone Lighter at [facility] orth[facility]. The patient had had recent hip x-ray which showed osteoarthritis and has had recent low back films as well at [facility] Recheck here in 6 months for followup office visit lipid panel hepatic function panel and basal metabolic panel.

## 2013-02-04 NOTE — Patient Instructions (Addendum)
Your physician recommends that you continue on your current medications as directed. Please refer to the Current Medication list given to you today.  Your physician wants you to follow-up in: 6 months with fasting labs (lp/bmet/hfp) You will receive a reminder letter in the mail two months in advance. If you don't receive a letter, please call our office to schedule the follow-up appointment.   Appointment with Dr Gladstone Lighter 02/15/13 at 8:15 arrive around 8:00 to fill out paperwork. Phone number [phone] if you need to reschedule

## 2013-02-04 NOTE — Assessment & Plan Note (Signed)
The patient has numbness from the knees down which she attributes to a neuropathy.  He was told by Dr. Jannifer Franklin several years ago that it may have been from colchicine or from Lipitor that he had taken previously.

## 2013-02-04 NOTE — Assessment & Plan Note (Signed)
The patient has not been having increasing chest pain or angina.  He has not been having any palpitations dizziness or syncope

## 2013-02-04 NOTE — Assessment & Plan Note (Signed)
Recently the patient was in his PCPs office complaining of pain in the right hip.  He was also noted to have some orthostatic hypotension.  He was sent from Dr. Eddie Dibbles office by EMS to come emergency room where he was evaluated and diagnosed as having pain arising in his right hip.  He was given Vicodin.  He went home and subsequently went to the chiropractor and had several manipulations.  5 days ago he again had severe pain and went back to the emergency room and additional x-rays were done and he was again told that his problem was in his right hip arthritis and he was given Percocet.  His pain continues to be severe and we will refer him at his request to [place] [facility]

## 2013-02-21 ENCOUNTER — Other Ambulatory Visit: Payer: Self-pay | Admitting: *Deleted

## 2013-02-21 DIAGNOSIS — M109 Gout, unspecified: Secondary | ICD-10-CM

## 2013-02-21 MED ORDER — ALLOPURINOL 100 MG PO TABS: 100.0000 mg | ORAL_TABLET | Freq: Two times a day (BID) | ORAL | Status: AC

## 2013-03-22 ENCOUNTER — Ambulatory Visit (INDEPENDENT_AMBULATORY_CARE_PROVIDER_SITE_OTHER): Payer: Medicare Other | Admitting: Neurology

## 2013-03-22 ENCOUNTER — Encounter: Payer: Self-pay | Admitting: Neurology

## 2013-03-22 VITALS — BP 162/79 | HR 73 | Wt 196.0 lb

## 2013-03-22 DIAGNOSIS — M21372 Foot drop, left foot: Secondary | ICD-10-CM

## 2013-03-22 DIAGNOSIS — M216X9 Other acquired deformities of unspecified foot: Secondary | ICD-10-CM

## 2013-03-22 DIAGNOSIS — M21371 Foot drop, right foot: Secondary | ICD-10-CM

## 2013-03-22 DIAGNOSIS — D518 Other vitamin B12 deficiency anemias: Secondary | ICD-10-CM | POA: Insufficient documentation

## 2013-03-22 DIAGNOSIS — R6889 Other general symptoms and signs: Secondary | ICD-10-CM | POA: Insufficient documentation

## 2013-03-22 DIAGNOSIS — G629 Polyneuropathy, unspecified: Secondary | ICD-10-CM

## 2013-03-22 DIAGNOSIS — G589 Mononeuropathy, unspecified: Secondary | ICD-10-CM

## 2013-03-22 DIAGNOSIS — R269 Unspecified abnormalities of gait and mobility: Secondary | ICD-10-CM

## 2013-03-22 HISTORY — DX: Foot drop, right foot: M21.371

## 2013-03-22 HISTORY — DX: Foot drop, right foot: M21.372

## 2013-03-22 NOTE — Progress Notes (Signed)
   Reason for visit: Peripheral neuropathy  Steven Cameron is an 78 y.o. male  History of present illness:  Steven Cameron is a 78 year old right-handed white male with a history of a peripheral neuropathy. The patient has last been seen through this office 2 years ago. The patient at that time was on a statin drug and colchicine, but these medications have been stopped one year prior, and he had continued to progress. The patient was documented to have a peripheral neuropathy of moderate to severe severity at that time associated with bilateral foot drops. The patient has continued to progress some since last seen, and he will fall on occasion. The patient likes to be active, and he commonly is outside of the house , and he will stumble frequently on uneven ground. The patient is operating a motor vehicle, but he admits to having problems with pushing both the brake pedal and the accelerator at the same time because he cannot feel his feet. The patient perceives numbness up to the knees bilaterally. The patient has some numbness in the hands, but he denies any problems dropping things. The patient is sleeping fairly well, and he does not have any pain with the neuropathy. The patient denies any problems controlling the bowels or the bladder. The patient does have some discomfort associated with degenerative arthritis.  ROS:  Out of a complete 14 system review of symptoms, the patient complains only of the following symptoms, and all other reviewed systems are negative.  Fatigue Moles Shortness of breath Incontinence and impotence Easy bruising Easy bleeding Joint pain Cramps Achy muscles Memory loss Numbness Weakness Dizziness Depression Anxiety Disinterest in activities  Blood pressure 162/79, pulse 73, weight 196 lb (88.905 kg).  Physical Exam  General: The patient is alert and cooperative at the time of the examination.  Skin: No significant peripheral edema is  noted.   Neurologic Exam  Cranial nerves: Facial symmetry is present. Speech is normal, no aphasia or dysarthria is noted. Extraocular movements are full. Visual fields are full.  Motor: The patient has good strength in all 4 extremities with exception of bilateral foot drops, inversion and eversion weakness of both feet.  Sensory: The patient has a pinprick sensory deficit up to the knees bilaterally.  Coordination: The patient has good finger-nose-finger and heel-to-shin bilaterally.  Gait and station: The patient has a wide-based steppage gait bilaterally. Tandem gait is poor. Romberg is unsteady, the patient does not fall. No drift is seen.  Reflexes: Deep tendon reflexes are symmetric, but the deep tendon reflexes are depressed throughout.   Assessment/Plan:  One. Peripheral neuropathy  2. Bilateral foot drop  3. Gait instability  The patient has a prominent wide-based, steppage gait. The patient is at risk for falling. I will refer the patient for physical therapy for gait training, and for evaluation of bilateral AFO braces. The patient may need to convert his motor vehicle to hand controls to continue driving. The patient is not to climb ladders, and he may require a cane when he is walking on uneven ground. The patient will followup in 6-8 months. There is no indication for medical therapy for his peripheral neuropathy as he has no discomfort.  Jill Alexanders MD 03/22/2013 2:51 PM

## 2013-03-30 ENCOUNTER — Other Ambulatory Visit: Payer: Self-pay | Admitting: Neurology

## 2013-03-30 DIAGNOSIS — R269 Unspecified abnormalities of gait and mobility: Secondary | ICD-10-CM

## 2013-04-07 ENCOUNTER — Ambulatory Visit: Payer: Medicare Other | Attending: Neurology | Admitting: Physical Therapy

## 2013-04-07 DIAGNOSIS — IMO0001 Reserved for inherently not codable concepts without codable children: Secondary | ICD-10-CM | POA: Insufficient documentation

## 2013-04-07 DIAGNOSIS — R269 Unspecified abnormalities of gait and mobility: Secondary | ICD-10-CM | POA: Insufficient documentation

## 2013-04-12 ENCOUNTER — Ambulatory Visit: Payer: Medicare Other | Admitting: Physical Therapy

## 2013-04-18 ENCOUNTER — Ambulatory Visit: Payer: Medicare Other | Admitting: Physical Therapy

## 2013-04-18 ENCOUNTER — Encounter: Payer: Self-pay | Admitting: Physical Therapy

## 2013-04-21 ENCOUNTER — Ambulatory Visit: Payer: Medicare Other | Admitting: *Deleted

## 2013-04-25 ENCOUNTER — Ambulatory Visit: Payer: Medicare Other | Admitting: Physical Therapy

## 2013-04-28 ENCOUNTER — Ambulatory Visit: Payer: Medicare Other | Attending: Neurology | Admitting: Physical Therapy

## 2013-04-28 DIAGNOSIS — R269 Unspecified abnormalities of gait and mobility: Secondary | ICD-10-CM | POA: Insufficient documentation

## 2013-04-28 DIAGNOSIS — IMO0001 Reserved for inherently not codable concepts without codable children: Secondary | ICD-10-CM | POA: Insufficient documentation

## 2013-05-02 ENCOUNTER — Ambulatory Visit: Payer: Medicare Other | Admitting: Physical Therapy

## 2013-05-05 ENCOUNTER — Ambulatory Visit: Payer: Medicare Other | Admitting: Physical Therapy

## 2013-07-13 ENCOUNTER — Other Ambulatory Visit: Payer: Self-pay | Admitting: *Deleted

## 2013-07-13 DIAGNOSIS — F419 Anxiety disorder, unspecified: Secondary | ICD-10-CM

## 2013-07-14 MED ORDER — ALPRAZOLAM 0.5 MG PO TABS: 0.5000 mg | ORAL_TABLET | Freq: Three times a day (TID) | ORAL | Status: DC | PRN

## 2013-09-22 ENCOUNTER — Encounter: Payer: Self-pay | Admitting: Neurology

## 2013-09-22 ENCOUNTER — Ambulatory Visit (INDEPENDENT_AMBULATORY_CARE_PROVIDER_SITE_OTHER): Payer: Medicare Other | Admitting: Neurology

## 2013-09-22 VITALS — BP 157/76 | HR 76 | Wt 199.0 lb

## 2013-09-22 DIAGNOSIS — M21371 Foot drop, right foot: Secondary | ICD-10-CM

## 2013-09-22 DIAGNOSIS — M216X9 Other acquired deformities of unspecified foot: Secondary | ICD-10-CM

## 2013-09-22 DIAGNOSIS — R269 Unspecified abnormalities of gait and mobility: Secondary | ICD-10-CM

## 2013-09-22 DIAGNOSIS — G589 Mononeuropathy, unspecified: Secondary | ICD-10-CM

## 2013-09-22 DIAGNOSIS — G629 Polyneuropathy, unspecified: Secondary | ICD-10-CM

## 2013-09-22 NOTE — Progress Notes (Signed)
Reason for visit: Peripheral neuropathy  Steven Cameron is an 78 y.o. male  History of present illness:  Steven Cameron is a 78 year old right-handed white male with a history of a peripheral neuropathy associated with bilateral foot drops. The patient does fall on occasion, usually once or twice a week. The patient indicates that his falls generally are while he is outside doing yard work, on uneven ground. The patient has gotten bilateral AFO braces that have helped him. The patient does have a cane, but he does not use this when he is out of the house. The patient has arthritis-related pain, but he denies any discomfort associated with the peripheral neuropathy. The patient will occasionally have hydrocodone to take if he needs it. Previously, Lyrica resulted in too much gait instability. The patient returns for an evaluation.  Past Medical History  Diagnosis Date  . IHD (ischemic heart disease)   . Arthritis   . Gout   . Sinus congestion   . GERD (gastroesophageal reflux disease)   . Hypertension   . Hyperlipidemia   . Neuropathy     In both legs, below knee  . Depression   . Anxiety disorder   . Cancer     Prostate  . Gait disorder 03/22/2013  . Peripheral neuropathy     Probable  . Coronary artery disease   . Foot drop, bilateral 03/22/2013    Past Surgical History  Procedure Laterality Date  . Cardiac catheterization  09/24/2004    EF 60%  . Coronary artery bypass graft  2004  . US echocardiography  01/19/2008    EF 55-60%  . Cardiovascular stress test  07/17/2010    EF 61%  . Radium seed implant      Prostate cancer  . Tonsillectomy    . Hemorrhoid surgery    . Cataract extraction, bilateral      Family History  Problem Relation Age of Onset  . Alzheimer's disease Mother 47  . Coronary artery disease Father   . Prostate cancer Father     Social history:  reports that he quit smoking about 22 years ago. He does not have any smokeless tobacco history on file.  He reports that he does not drink alcohol or use illicit drugs.    Allergies  Allergen Reactions  . Lyrica [Pregabalin]     Gait instability  . Statins Other (See Comments)    Weakness of legs  . Ibuprofen Rash    Medications:  Current Outpatient Prescriptions on File Prior to Visit  Medication Sig Dispense Refill  . allopurinol (ZYLOPRIM) 100 MG tablet Take 1 tablet (100 mg total) by mouth 2 (two) times daily.  60 tablet  11  . ALPRAZolam (XANAX) 0.5 MG tablet Take 1 tablet (0.5 mg total) by mouth 3 (three) times daily as needed.  90 tablet  5  . aspirin 81 MG EC tablet Take 81 mg by mouth daily.        . B Complex-C (SUPER B COMPLEX PO) Take 1 tablet by mouth daily.      . clopidogrel (PLAVIX) 75 MG tablet Take 75 mg by mouth at bedtime.       . Misc Natural Products (OSTEO BI-FLEX JOINT SHIELD PO) Take 1 tablet by mouth 2 (two) times daily.       . nitroGLYCERIN (NITROSTAT) 0.4 MG SL tablet Place 0.4 mg under the tongue every 5 (five) minutes as needed. For chest pain      . omega-3  acid ethyl esters (LOVAZA) 1 G capsule Take 1 g by mouth 3 (three) times daily.      Marland Kitchen omeprazole (PRILOSEC) 20 MG capsule Take 20 mg by mouth 2 (two) times daily.      . Red Yeast Rice 600 MG CAPS Take 600 mg by mouth 2 (two) times daily.      . valsartan-hydrochlorothiazide (DIOVAN-HCT) 80-12.5 MG per tablet Take 0.5 tablets by mouth daily.       No current facility-administered medications on file prior to visit.    ROS:  Out of a complete 14 system review of symptoms, the patient complains only of the following symptoms, and all other reviewed systems are negative.  Easy bruising, easy bleeding Gait instability Joint pain  Blood pressure 157/76, pulse 76, weight 199 lb (90.266 kg).  Physical Exam  General: The patient is alert and cooperative at the time of the examination.  Skin: No significant peripheral edema is noted.   Neurologic Exam  Cranial nerves: Facial symmetry is  present. Speech is normal, no aphasia or dysarthria is noted. Extraocular movements are full. Visual fields are full.  Motor: The patient has good strength in all 4 extremities, with exception of bilateral foot drops. The patient has an AFO braces on bilaterally.  Coordination: The patient has good finger-nose-finger and heel-to-shin bilaterally.  Gait and station: The patient has a wide-based gait, slightly unsteady. Tandem gait is quite unsteady. Romberg is negative, but is unsteady. No drift is seen.  Reflexes: Deep tendon reflexes are symmetric, but are depressed.   Assessment/Plan:  1. Peripheral neuropathy  2. Bilateral foot drops  3. Gait instability  The patient does not require medications for neuropathy pain. The patient does a lot of work outside on uneven ground, and he is susceptible to falling. I have instructed him to wear ankle support braces, and use a high level boot while outside to give him extra foot and ankle support. The patient is to watch where he is stepping to improve his gait stability. The patient will followup in one year.  Jill Alexanders MD 09/22/2013 9:11 PM  [facility] [address] [place], [place] [address]  Phone [phone] Fax [phone]

## 2013-11-21 ENCOUNTER — Other Ambulatory Visit: Payer: Self-pay | Admitting: Dermatology

## 2013-12-16 ENCOUNTER — Ambulatory Visit (INDEPENDENT_AMBULATORY_CARE_PROVIDER_SITE_OTHER): Payer: Medicare Other | Admitting: Cardiology

## 2013-12-16 ENCOUNTER — Telehealth: Payer: Self-pay | Admitting: Cardiology

## 2013-12-16 ENCOUNTER — Encounter: Payer: Self-pay | Admitting: Cardiology

## 2013-12-16 VITALS — BP 156/75 | HR 52 | Ht 71.0 in | Wt 204.0 lb

## 2013-12-16 DIAGNOSIS — E559 Vitamin D deficiency, unspecified: Secondary | ICD-10-CM

## 2013-12-16 DIAGNOSIS — E785 Hyperlipidemia, unspecified: Secondary | ICD-10-CM

## 2013-12-16 DIAGNOSIS — Z951 Presence of aortocoronary bypass graft: Secondary | ICD-10-CM

## 2013-12-16 DIAGNOSIS — I119 Hypertensive heart disease without heart failure: Secondary | ICD-10-CM

## 2013-12-16 DIAGNOSIS — R269 Unspecified abnormalities of gait and mobility: Secondary | ICD-10-CM

## 2013-12-16 MED ORDER — VALSARTAN-HYDROCHLOROTHIAZIDE 160-12.5 MG PO TABS: 1.0000 | ORAL_TABLET | Freq: Every day | ORAL | Status: DC

## 2013-12-16 NOTE — Progress Notes (Signed)
Allegra Lai Date of Birth:  [birth year] [address] [place], [place]  [address] [phone]         Fax   [phone]  History of Present Illness: This pleasant 78 year old gentleman is seen for a work in followup office visit.  He had missed his 6 month appointment in August. He has a past history of ischemic heart disease. He had coronary artery bypass graft surgery in 2004. He also has a history of previous gout. 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  He saw Dr. Jannifer Franklin concerning this about 2 or 3 years ago. He now takes allopurinol .  He has a history of high blood pressure.  He comes in today after having an episode last night of sudden hypertension associated with a feeling of being hot and then diaphoresis.  He felt near syncopal.  He sat down on the floor of the kitchen and did not lose consciousness.  He states the entire episode lasted about 2 minutes.  He checked his blood pressure at home and was greater than 546 systolic.  Current Outpatient Prescriptions  Medication Sig Dispense Refill  . allopurinol (ZYLOPRIM) 100 MG tablet Take 1 tablet (100 mg total) by mouth 2 (two) times daily.  60 tablet  11  . ALPRAZolam (XANAX) 0.5 MG tablet Take 1 tablet (0.5 mg total) by mouth 3 (three) times daily as needed.  90 tablet  5  . aspirin 81 MG EC tablet Take 81 mg by mouth daily.        . B Complex-C (SUPER B COMPLEX PO) Take 1 tablet by mouth daily.      . cholecalciferol (VITAMIN D) 1000 UNITS tablet Take 1,000 Units by mouth daily.      . clopidogrel (PLAVIX) 75 MG tablet Take 75 mg by mouth at bedtime.       Marland Kitchen HYDROcodone-acetaminophen (NORCO/VICODIN) 5-325 MG per tablet Take 5-325 tablets by mouth every 6 (six) hours as needed.       . lovastatin (MEVACOR) 10 MG tablet Take 10 mg by mouth daily.      . Misc Natural Products (OSTEO BI-FLEX JOINT SHIELD PO) Take 1 tablet by mouth 2 (two) times daily.       . nitroGLYCERIN (NITROSTAT)  0.4 MG SL tablet Place 0.4 mg under the tongue every 5 (five) minutes as needed. For chest pain      . omega-3 acid ethyl esters (LOVAZA) 1 G capsule Take 1 g by mouth 3 (three) times daily.      Marland Kitchen omeprazole (PRILOSEC) 20 MG capsule Take 20 mg by mouth 2 (two) times daily.      . Red Yeast Rice 600 MG CAPS Take 600 mg by mouth 2 (two) times daily.      . valsartan-hydrochlorothiazide (DIOVAN HCT) 160-12.5 MG per tablet Take 1 tablet by mouth daily.  90 tablet  3   No current facility-administered medications for this visit.    Allergies  Allergen Reactions  . Lyrica [Pregabalin]     Gait instability  . Statins Other (See Comments)    Weakness of legs  . Ibuprofen Rash    Patient Active Problem List   Diagnosis Date Noted  . Vitamin D deficiency 12/16/2013  . Gait disorder 03/22/2013  . Other vitamin B12 deficiency anemia 03/22/2013  . Other general symptoms 03/22/2013  . Foot drop, bilateral 03/22/2013  . Osteoarthritis 02/04/2013  . Neuropathy 02/04/2013  . Benign hypertensive heart  disease without heart failure 02/04/2012  . Dizziness - light-headed 08/08/2011  . Hx of CABG 08/08/2011  . Dyslipidemia 08/08/2011    History  Smoking status  . Former Smoker  . Quit date: 08/01/1991  Smokeless tobacco  . Not on file    History  Alcohol Use No    Family History  Problem Relation Age of Onset  . Alzheimer's disease Mother 81  . Coronary artery disease Father   . Prostate cancer Father     Review of Systems: Constitutional: no fever chills diaphoresis or fatigue or change in weight.  Head and neck: no hearing loss, no epistaxis, no photophobia or visual disturbance. Respiratory: No cough, shortness of breath or wheezing. Cardiovascular: No chest pain peripheral edema, palpitations. Gastrointestinal: No abdominal distention, no abdominal pain, no change in bowel habits hematochezia or melena. Genitourinary: No dysuria, no frequency, no urgency, no  nocturia. Musculoskeletal:No arthralgias, no back pain, no gait disturbance or myalgias. Neurological: No dizziness, no headaches, no numbness, no seizures, no syncope, no weakness, no tremors. Hematologic: No lymphadenopathy, no easy bruising. Psychiatric: No confusion, no hallucinations, no sleep disturbance.    Physical Exam: Filed Vitals:   12/16/13 1128  BP: 156/75  Pulse: 52   the general appearance reveals a well-developed well-nourished gentleman in no distress.The head and neck exam reveals pupils equal and reactive.  Extraocular movements are full.  There is no scleral icterus.  The mouth and pharynx are normal.  The neck is supple.  The carotids reveal no bruits.  The jugular venous pressure is normal.  The  thyroid is not enlarged.  There is no lymphadenopathy.  The chest is clear to percussion and auscultation.  There are no rales or rhonchi.  Expansion of the chest is symmetrical.  The precordium is quiet.  The first heart sound is normal.  The second heart sound is physiologically split.  There is no murmur gallop rub or click.  There is no abnormal lift or heave.  The abdomen is soft and nontender.  The bowel sounds are normal.  The liver and spleen are not enlarged.  There are no abdominal masses.  There are no abdominal bruits.  Extremities reveal good pedal pulses.  There is no phlebitis or edema.  There is no cyanosis or clubbing.  Strength is normal and symmetrical in all extremities.  There is no lateralizing weakness.  There are no sensory deficits.  The skin is warm and dry.  There is no rash.  EKG today shows normal sinus rhythm and is within normal limits   Assessment / Plan: The patient will continue same medication except we are increasing Diovan HCT of 260/12.5 one daily.  Recheck in 3 months for office visit fasting lipid panel hepatic function panel basal metabolic panel and vitamin D level

## 2013-12-16 NOTE — Telephone Encounter (Signed)
New Message  Pt called states that he had to call 911 last night BP was 209/90// He states that he was extremely dizzy. Pt is requesting a same day appt.. Please assist.

## 2013-12-16 NOTE — Assessment & Plan Note (Signed)
He brought in his home blood pressure cuff so that we could calibrate it.  His cuff is accurate.  His blood pressure today by his cuff and by our cuff was similar.  I got 152/78.  He has been on low-dose Diovan HCT and we're going to increase the dose to Diovan HCT 160/12.5 one daily

## 2013-12-16 NOTE — Assessment & Plan Note (Signed)
Patient has a past history of severe peripheral neuropathy and gait disorder.  He walks with a cane.

## 2013-12-16 NOTE — Assessment & Plan Note (Signed)
The patient has not been experiencing any symptoms of angina pectoris or recurrent chest pain

## 2013-12-16 NOTE — Assessment & Plan Note (Signed)
Patient has a past history of vitamin D deficiency.  We will plan to recheck his vitamin D level at his next visit

## 2013-12-16 NOTE — Patient Instructions (Signed)
INCREASE YOUR DIOVAN HCT TO 160/12.5 MG DAILY  Your physician recommends that you schedule a follow-up appointment in: 3 months with fasting labs (LP/BMET/HFP/VITAMIN D)

## 2013-12-16 NOTE — Telephone Encounter (Signed)
Patient coming to see  Dr. Mare Ferrari this am at 11:00

## 2014-03-14 ENCOUNTER — Other Ambulatory Visit (INDEPENDENT_AMBULATORY_CARE_PROVIDER_SITE_OTHER): Payer: Medicare Other

## 2014-03-14 DIAGNOSIS — E785 Hyperlipidemia, unspecified: Secondary | ICD-10-CM

## 2014-03-14 DIAGNOSIS — E559 Vitamin D deficiency, unspecified: Secondary | ICD-10-CM

## 2014-03-14 LAB — BASIC METABOLIC PANEL
BUN: 24 mg/dL — AB (ref 6–23)
CHLORIDE: 101 meq/L (ref 96–112)
CO2: 26 mEq/L (ref 19–32)
CREATININE: 1.2 mg/dL (ref 0.4–1.5)
Calcium: 9.5 mg/dL (ref 8.4–10.5)
GFR: 61.32 mL/min (ref >60.00)
GLUCOSE: 98 mg/dL (ref 70–99)
POTASSIUM: 4.1 meq/L (ref 3.5–5.1)
Sodium: 137 mEq/L (ref 135–145)

## 2014-03-14 LAB — LIPID PANEL
CHOLESTEROL: 173 mg/dL (ref 0–200)
HDL: 35.9 mg/dL — ABNORMAL LOW (ref >39.00)
LDL Cholesterol: 113 mg/dL — ABNORMAL HIGH (ref 0–99)
TRIGLYCERIDES: 120 mg/dL (ref 0.0–149.0)
Total CHOL/HDL Ratio: 5
VLDL: 24 mg/dL (ref 0.0–40.0)

## 2014-03-14 LAB — HEPATIC FUNCTION PANEL
ALBUMIN: 4.4 g/dL (ref 3.5–5.2)
ALT: 35 U/L (ref 0–53)
AST: 23 U/L (ref 0–37)
Alkaline Phosphatase: 54 U/L (ref 39–117)
Bilirubin, Direct: 0.1 mg/dL (ref 0.0–0.3)
Total Bilirubin: 0.8 mg/dL (ref 0.3–1.2)
Total Protein: 7.5 g/dL (ref 6.0–8.3)

## 2014-03-14 NOTE — Progress Notes (Signed)
Quick Note:  Please make copy of labs for patient visit. ______ 

## 2014-03-15 LAB — VITAMIN D 25 HYDROXY (VIT D DEFICIENCY, FRACTURES): VIT D 25 HYDROXY: 62 ng/mL (ref 30–89)

## 2014-03-20 ENCOUNTER — Encounter: Payer: Self-pay | Admitting: Cardiology

## 2014-03-20 ENCOUNTER — Ambulatory Visit (INDEPENDENT_AMBULATORY_CARE_PROVIDER_SITE_OTHER): Payer: Medicare Other | Admitting: Cardiology

## 2014-03-20 VITALS — BP 138/75 | HR 82 | Ht 71.0 in | Wt 203.0 lb

## 2014-03-20 DIAGNOSIS — E785 Hyperlipidemia, unspecified: Secondary | ICD-10-CM

## 2014-03-20 DIAGNOSIS — I119 Hypertensive heart disease without heart failure: Secondary | ICD-10-CM

## 2014-03-20 DIAGNOSIS — G589 Mononeuropathy, unspecified: Secondary | ICD-10-CM

## 2014-03-20 DIAGNOSIS — Z951 Presence of aortocoronary bypass graft: Secondary | ICD-10-CM

## 2014-03-20 DIAGNOSIS — G629 Polyneuropathy, unspecified: Secondary | ICD-10-CM

## 2014-03-20 NOTE — Patient Instructions (Signed)
Your physician recommends that you continue on your current medications as directed. Please refer to the Current Medication list given to you today.  Your physician wants you to follow-up in: 6 months with fasting labs (lp/bmet/hfp) and ekg  You will receive a reminder letter in the mail two months in advance. If you don't receive a letter, please call our office to schedule the follow-up appointment.

## 2014-03-20 NOTE — Progress Notes (Signed)
Steven Cameron Date of Birth:  1935/04/25 [address] [place], [place]  [address] [phone]         Fax   [phone]  History of Present Illness: This pleasant 79 year old gentleman is seen for a work in followup office visit.  He has a past history of ischemic heart disease. He had coronary artery bypass graft surgery in 2004. He also has a history of previous gout. 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  He saw Dr. Jannifer Franklin concerning this about 2 or 3 years ago. He now takes allopurinol .  He has a history of high blood pressure.   Current Outpatient Prescriptions  Medication Sig Dispense Refill  . allopurinol (ZYLOPRIM) 100 MG tablet Take 1 tablet (100 mg total) by mouth 2 (two) times daily.  60 tablet  11  . ALPRAZolam (XANAX) 0.5 MG tablet Take 1 tablet (0.5 mg total) by mouth 3 (three) times daily as needed.  90 tablet  5  . aspirin 81 MG EC tablet Take 81 mg by mouth daily.        . B Complex-C (SUPER B COMPLEX PO) Take 1 tablet by mouth daily.      . cholecalciferol (VITAMIN D) 1000 UNITS tablet Take 1,000 Units by mouth daily.      . clopidogrel (PLAVIX) 75 MG tablet Take 75 mg by mouth at bedtime.       Marland Kitchen HYDROcodone-acetaminophen (NORCO/VICODIN) 5-325 MG per tablet Take 5-325 tablets by mouth every 6 (six) hours as needed.       . lovastatin (MEVACOR) 10 MG tablet Take 10 mg by mouth daily.      . Misc Natural Products (OSTEO BI-FLEX JOINT SHIELD PO) Take 1 tablet by mouth 2 (two) times daily.       . nitroGLYCERIN (NITROSTAT) 0.4 MG SL tablet Place 0.4 mg under the tongue every 5 (five) minutes as needed. For chest pain      . omega-3 acid ethyl esters (LOVAZA) 1 G capsule Take 1 g by mouth 3 (three) times daily.      Marland Kitchen omeprazole (PRILOSEC) 20 MG capsule Take 20 mg by mouth 2 (two) times daily.      . Red Yeast Rice 600 MG CAPS Take 600 mg by mouth 2 (two) times daily.      . valsartan-hydrochlorothiazide (DIOVAN HCT)  160-12.5 MG per tablet Take 1 tablet by mouth daily.  90 tablet  3   No current facility-administered medications for this visit.    Allergies  Allergen Reactions  . Lyrica [Pregabalin]     Gait instability  . Statins Other (See Comments)    Weakness of legs  . Ibuprofen Rash    Patient Active Problem List   Diagnosis Date Noted  . Vitamin D deficiency 12/16/2013  . Gait disorder 03/22/2013  . Other vitamin B12 deficiency anemia 03/22/2013  . Other general symptoms 03/22/2013  . Foot drop, bilateral 03/22/2013  . Osteoarthritis 02/04/2013  . Neuropathy 02/04/2013  . Benign hypertensive heart disease without heart failure 02/04/2012  . Dizziness - light-headed 08/08/2011  . Hx of CABG 08/08/2011  . Dyslipidemia 08/08/2011    History  Smoking status  . Former Smoker  . Quit date: 08/01/1991  Smokeless tobacco  . Not on file    History  Alcohol Use No    Family History  Problem Relation Age of Onset  . Alzheimer's disease Mother 49  . Coronary artery disease  Father   . Prostate cancer Father     Review of Systems: Constitutional: no fever chills diaphoresis or fatigue or change in weight.  Head and neck: no hearing loss, no epistaxis, no photophobia or visual disturbance. Respiratory: No cough, shortness of breath or wheezing. Cardiovascular: No chest pain peripheral edema, palpitations. Gastrointestinal: No abdominal distention, no abdominal pain, no change in bowel habits hematochezia or melena. Genitourinary: No dysuria, no frequency, no urgency, no nocturia. Musculoskeletal:No arthralgias, no back pain, no gait disturbance or myalgias. Neurological: No dizziness, no headaches, no numbness, no seizures, no syncope, no weakness, no tremors. Hematologic: No lymphadenopathy, no easy bruising. Psychiatric: No confusion, no hallucinations, no sleep disturbance.    Physical Exam: Filed Vitals:   03/20/14 1437  BP: 138/75  Pulse: 82   the general appearance  reveals a well-developed well-nourished gentleman in no distress.The head and neck exam reveals pupils equal and reactive.  Extraocular movements are full.  There is no scleral icterus.  The mouth and pharynx are normal.  The neck is supple.  The carotids reveal no bruits.  The jugular venous pressure is normal.  The  thyroid is not enlarged.  There is no lymphadenopathy.  The chest is clear to percussion and auscultation.  There are no rales or rhonchi.  Expansion of the chest is symmetrical.  The precordium is quiet.  The first heart sound is normal.  The second heart sound is physiologically split.  There is no murmur gallop rub or click.  There is no abnormal lift or heave.  The abdomen is soft and nontender.  The bowel sounds are normal.  The liver and spleen are not enlarged.  There are no abdominal masses.  There are no abdominal bruits.  Extremities reveal good pedal pulses.  There is no phlebitis or edema.  There is no cyanosis or clubbing.  Strength is normal and symmetrical in all extremities.  There is no lateralizing weakness.  There are no sensory deficits.  The skin is warm and dry.  There is no rash.     Assessment / Plan: Continue same medication.  Recheck in 6 months for office visit EKG and fasting lipid panel hepatic function panel and basal metabolic panel

## 2014-03-20 NOTE — Assessment & Plan Note (Signed)
The patient has significant neuropathy.  He wears braces on both lower legs and feet she been helpful in preventing him from having the toe drop which causes him to stumble.  He is followed for his neuropathy by Dr. Jannifer Franklin.  The patient tells me that his neuropathy was caused by colchicine.

## 2014-03-20 NOTE — Assessment & Plan Note (Signed)
The patient has a history of previous CABG 10 years ago.  He has not had any recurrent chest pain or angina.

## 2014-03-20 NOTE — Assessment & Plan Note (Signed)
Generally his blood pressure has been in a satisfactory range.  At times if he overworks he becomes hot and then his blood pressure will drop to a low level and he will  be dizzy and weak.

## 2014-04-18 ENCOUNTER — Other Ambulatory Visit: Payer: Self-pay | Admitting: Cardiology

## 2014-04-18 DIAGNOSIS — F419 Anxiety disorder, unspecified: Secondary | ICD-10-CM

## 2014-04-18 NOTE — Telephone Encounter (Signed)
Ok to refill Xanax per  Dr. Mare Ferrari, patient aware

## 2014-04-18 NOTE — Telephone Encounter (Signed)
New message  Pt called to discuss his medication Prazolin. He states that his primary care will not refill this medication. Requests a call back to discuss from nurse Alvina Filbert.

## 2014-04-19 MED ORDER — ALPRAZOLAM 0.5 MG PO TABS: 0.5000 mg | ORAL_TABLET | Freq: Three times a day (TID) | ORAL | Status: DC | PRN

## 2014-09-18 ENCOUNTER — Other Ambulatory Visit (INDEPENDENT_AMBULATORY_CARE_PROVIDER_SITE_OTHER): Payer: Medicare Other

## 2014-09-18 ENCOUNTER — Encounter: Payer: Self-pay | Admitting: Cardiology

## 2014-09-18 ENCOUNTER — Ambulatory Visit (INDEPENDENT_AMBULATORY_CARE_PROVIDER_SITE_OTHER): Payer: Medicare Other | Admitting: Cardiology

## 2014-09-18 VITALS — BP 130/66 | HR 56 | Ht 72.0 in | Wt 197.0 lb

## 2014-09-18 DIAGNOSIS — G589 Mononeuropathy, unspecified: Secondary | ICD-10-CM

## 2014-09-18 DIAGNOSIS — I119 Hypertensive heart disease without heart failure: Secondary | ICD-10-CM

## 2014-09-18 DIAGNOSIS — E785 Hyperlipidemia, unspecified: Secondary | ICD-10-CM

## 2014-09-18 DIAGNOSIS — Z951 Presence of aortocoronary bypass graft: Secondary | ICD-10-CM

## 2014-09-18 DIAGNOSIS — G629 Polyneuropathy, unspecified: Secondary | ICD-10-CM

## 2014-09-18 LAB — HEPATIC FUNCTION PANEL
ALBUMIN: 4.1 g/dL (ref 3.5–5.2)
ALT: 26 U/L (ref 0–53)
AST: 24 U/L (ref 0–37)
Alkaline Phosphatase: 60 U/L (ref 39–117)
Bilirubin, Direct: 0 mg/dL (ref 0.0–0.3)
Total Bilirubin: 0.7 mg/dL (ref 0.2–1.2)
Total Protein: 7.3 g/dL (ref 6.0–8.3)

## 2014-09-18 LAB — BASIC METABOLIC PANEL
BUN: 20 mg/dL (ref 6–23)
CALCIUM: 9.5 mg/dL (ref 8.4–10.5)
CO2: 29 mEq/L (ref 19–32)
Chloride: 102 mEq/L (ref 96–112)
Creatinine, Ser: 1 mg/dL (ref 0.4–1.5)
GFR: 73.62 mL/min (ref >60.00)
Glucose, Bld: 97 mg/dL (ref 70–99)
Potassium: 3.8 mEq/L (ref 3.5–5.1)
SODIUM: 139 meq/L (ref 135–145)

## 2014-09-18 LAB — LIPID PANEL
CHOLESTEROL: 143 mg/dL (ref 0–200)
HDL: 27.9 mg/dL — AB (ref >39.00)
LDL CALC: 88 mg/dL (ref 0–99)
NonHDL: 115.1
Total CHOL/HDL Ratio: 5
Triglycerides: 137 mg/dL (ref 0.0–149.0)
VLDL: 27.4 mg/dL (ref 0.0–40.0)

## 2014-09-18 NOTE — Assessment & Plan Note (Signed)
The patient is on lovastatin and fish oil capsules.  Lab work is pending.  He is not having any myalgias.

## 2014-09-18 NOTE — Patient Instructions (Signed)
Your physician recommends that you continue on your current medications as directed. Please refer to the Current Medication list given to you today.  Your physician wants you to follow-up in: 6 months with fasting labs (lp/bmet/hfp)  You will receive a reminder letter in the mail two months in advance. If you don't receive a letter, please call our office to schedule the follow-up appointment.

## 2014-09-18 NOTE — Assessment & Plan Note (Signed)
No chest pain.  No shortness of breath.  No dizziness or syncope.  No palpitations.

## 2014-09-18 NOTE — Progress Notes (Signed)
Steven Cameron Date of Birth:  [birth year] [facility] [address] [place], [place]  [address] [phone]        Fax   [phone]   History of Present Illness: This pleasant 79 year old gentleman is seen for a scheduled followup office visit. He has a past history of ischemic heart disease. He had coronary artery bypass graft surgery in 2004. He also has a history of previous gout. 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 He saw Dr. Jannifer Franklin concerning this about 2 or 3 years ago and will be seeing him in followup next week. He now takes allopurinol . He has a history of high blood pressure.  He has not been having any chest pain or shortness of breath. He has a new PCP.  It is Heide Scales NP at the [facility].   Current Outpatient Prescriptions  Medication Sig Dispense Refill  . allopurinol (ZYLOPRIM) 100 MG tablet Take 1 tablet (100 mg total) by mouth 2 (two) times daily.  60 tablet  11  . ALPRAZolam (XANAX) 0.5 MG tablet Take 1 tablet (0.5 mg total) by mouth 3 (three) times daily as needed.  270 tablet  1  . aspirin 81 MG EC tablet Take 81 mg by mouth daily.        . B Complex-C (SUPER B COMPLEX PO) Take 1 tablet by mouth daily.      . cholecalciferol (VITAMIN D) 1000 UNITS tablet Take 1,000 Units by mouth daily.      . clopidogrel (PLAVIX) 75 MG tablet Take 75 mg by mouth at bedtime.       Marland Kitchen HYDROcodone-acetaminophen (NORCO/VICODIN) 5-325 MG per tablet Take 5-325 tablets by mouth every 6 (six) hours as needed.       . lovastatin (MEVACOR) 10 MG tablet Take 10 mg by mouth daily.      . Misc Natural Products (OSTEO BI-FLEX JOINT SHIELD PO) Take 1 tablet by mouth 2 (two) times daily.       . nitroGLYCERIN (NITROSTAT) 0.4 MG SL tablet Place 0.4 mg under the tongue every 5 (five) minutes as needed. For chest pain      . omega-3 acid ethyl esters (LOVAZA) 1 G capsule Take 1 g by mouth 3 (three) times daily.      Marland Kitchen  omeprazole (PRILOSEC) 20 MG capsule Take 20 mg by mouth 2 (two) times daily.      . Red Yeast Rice 600 MG CAPS Take 600 mg by mouth 2 (two) times daily.      . valsartan-hydrochlorothiazide (DIOVAN HCT) 160-12.5 MG per tablet Take 1 tablet by mouth daily.  90 tablet  3   No current facility-administered medications for this visit.    Allergies  Allergen Reactions  . Lyrica [Pregabalin]     Gait instability  . Statins Other (See Comments)    Weakness of legs  . Ibuprofen Rash    Patient Active Problem List   Diagnosis Date Noted  . Vitamin D deficiency 12/16/2013  . Gait disorder 03/22/2013  . Other vitamin B12 deficiency anemia 03/22/2013  . Other general symptoms 03/22/2013  . Foot drop, bilateral 03/22/2013  . Osteoarthritis 02/04/2013  . Neuropathy 02/04/2013  . Benign hypertensive heart disease without heart failure 02/04/2012  . Dizziness - light-headed 08/08/2011  . Hx of CABG 08/08/2011  . Dyslipidemia 08/08/2011    History  Smoking status  . Former Smoker  . Quit date: 08/01/1991  Smokeless tobacco  . Not on file    History  Alcohol Use No    Family History  Problem Relation Age of Onset  . Alzheimer's disease Mother 51  . Coronary artery disease Father   . Prostate cancer Father     Review of Systems: Constitutional: no fever chills diaphoresis or fatigue or change in weight.  Head and neck: no hearing loss, no epistaxis, no photophobia or visual disturbance. Respiratory: No cough, shortness of breath or wheezing. Cardiovascular: No chest pain peripheral edema, palpitations. Gastrointestinal: No abdominal distention, no abdominal pain, no change in bowel habits hematochezia or melena. Genitourinary: No dysuria, no frequency, no urgency, no nocturia. Musculoskeletal:No arthralgias, no back pain, no gait disturbance or myalgias. Neurological: No dizziness, no headaches, no numbness, no seizures, no syncope, no weakness, no tremors. Hematologic: No  lymphadenopathy, no easy bruising. Psychiatric: No confusion, no hallucinations, no sleep disturbance.    Physical Exam: Filed Vitals:   09/18/14 0803  BP: 130/66  Pulse: 56  The patient appears to be in no distress.  Head and neck exam reveals that the pupils are equal and reactive.  The extraocular movements are full.  There is no scleral icterus.  Mouth and pharynx are benign.  No lymphadenopathy.  No carotid bruits.  The jugular venous pressure is normal.  Thyroid is not enlarged or tender.  Chest is clear to percussion and auscultation.  No rales or rhonchi.  Expansion of the chest is symmetrical.  Heart reveals no abnormal lift or heave.  First and second heart sounds are normal.  There is no murmur gallop rub or click.  The abdomen is soft and nontender.  Bowel sounds are normoactive.  There is no hepatosplenomegaly or mass.  No abnormal abdominal pulsations.  There are no abdominal bruits.  Extremities reveal no phlebitis or edema.  Pedal pulses are good.  There is no cyanosis or clubbing.  Neurologic exam reveals bilateral foot drop neuropathy.  No sensory deficits.  Integument reveals no rash  EKG shows sinus bradycardia and is within normal limits.  Assessment / Plan: 1. Hypercholesterolemia 2. essential hypertension 3. ischemic heart disease status post CABG 2004 4. history of peripheral neuropathy 5. history of gout  Plan: Continue same medication.  Recheck in 6 months for office visit lipid panel hepatic function panel and basal metabolic panel.

## 2014-09-18 NOTE — Assessment & Plan Note (Signed)
He has a peripheral neuropathy with gait disturbance.  He wears braces on both legs.  He is seeing a Restaurant manager, fast food.  The chiropractor did x-rays and noted some calcium of the aorta.  The patient does not have any family history of abdominal aneurysm.  He is not having any abdominal pain or back pain.  Examination of the abdomen reveals no pulsatile mass.

## 2014-09-19 NOTE — Progress Notes (Signed)
Quick Note:  Please report to patient. The recent labs are stable. Continue same medication and careful diet. ______

## 2014-09-22 ENCOUNTER — Ambulatory Visit (INDEPENDENT_AMBULATORY_CARE_PROVIDER_SITE_OTHER): Payer: Medicare Other | Admitting: Adult Health

## 2014-09-22 ENCOUNTER — Encounter: Payer: Self-pay | Admitting: Adult Health

## 2014-09-22 VITALS — BP 138/75 | HR 67 | Wt 202.0 lb

## 2014-09-22 DIAGNOSIS — R269 Unspecified abnormalities of gait and mobility: Secondary | ICD-10-CM

## 2014-09-22 DIAGNOSIS — M21372 Foot drop, left foot: Secondary | ICD-10-CM

## 2014-09-22 DIAGNOSIS — M21371 Foot drop, right foot: Secondary | ICD-10-CM

## 2014-09-22 DIAGNOSIS — G609 Hereditary and idiopathic neuropathy, unspecified: Secondary | ICD-10-CM | POA: Insufficient documentation

## 2014-09-22 DIAGNOSIS — M216X9 Other acquired deformities of unspecified foot: Secondary | ICD-10-CM

## 2014-09-22 NOTE — Progress Notes (Signed)
PATIENT: Steven Cameron DOB: [birth year]  REASON FOR VISIT: follow up HISTORY FROM: patient  HISTORY OF PRESENT ILLNESS: Steven Cameron is a 79 year old male with a history of a peripheral neuropathy with bilateral foot drops. He returns today for follow-up. He does not have any discomfort associated with his neuropathy. He has AFO braces for his foot drops. He is currently going to a chiropractor for a "rebuilding the nerves program." He feels that he has noticed some mild improvement since starting that program. His states that his gait has remained the same. He uses a cane with ambulating. He a fall a month ago. He states that he did not pick up his foot high enough and tripped.                                                               HISTORY 09/22/13 (CW): 79 year old right-handed white male with a history of a peripheral neuropathy associated with bilateral foot drops. The patient does fall on occasion, usually once or twice a week. The patient indicates that his falls generally are while he is outside doing yard work, on uneven ground. The patient has gotten bilateral AFO braces that have helped him. The patient does have a cane, but he does not use this when he is out of the house. The patient has arthritis-related pain, but he denies any discomfort associated with the peripheral neuropathy. The patient will occasionally have hydrocodone to take if he needs it. Previously, Lyrica resulted in too much gait instability. The patient returns for an evaluation.   REVIEW OF SYSTEMS: Full 14 system review of systems performed and notable only for:  Constitutional: N/A  Eyes: N/A Ear/Nose/Throat: N/A  Skin: N/A  Cardiovascular: N/A  Respiratory: N/A  Gastrointestinal: N/A         Genitourinary: N/A Hematology/Lymphatic: N/A  Endocrine: N/A Musculoskeletal:N/A  Allergy/Immunology: N/A  Neurological: N/A Psychiatric: N/A Sleep: N/A   ALLERGIES: Allergies  Allergen Reactions  .  Lyrica [Pregabalin]     Gait instability  . Statins Other (See Comments)    Weakness of legs  . Ibuprofen Rash    HOME MEDICATIONS: Outpatient Prescriptions Prior to Visit  Medication Sig Dispense Refill  . allopurinol (ZYLOPRIM) 100 MG tablet Take 1 tablet (100 mg total) by mouth 2 (two) times daily.  60 tablet  11  . ALPRAZolam (XANAX) 0.5 MG tablet Take 1 tablet (0.5 mg total) by mouth 3 (three) times daily as needed.  270 tablet  1  . aspirin 81 MG EC tablet Take 81 mg by mouth daily.        . B Complex-C (SUPER B COMPLEX PO) Take 1 tablet by mouth daily.      . cholecalciferol (VITAMIN D) 1000 UNITS tablet Take 1,000 Units by mouth daily.      . clopidogrel (PLAVIX) 75 MG tablet Take 75 mg by mouth at bedtime.       Marland Kitchen HYDROcodone-acetaminophen (NORCO/VICODIN) 5-325 MG per tablet Take 5-325 tablets by mouth every 6 (six) hours as needed.       . lovastatin (MEVACOR) 10 MG tablet Take 10 mg by mouth daily.      . Misc Natural Products (OSTEO BI-FLEX JOINT SHIELD PO) Take 1 tablet by mouth 2 (two)  times daily.       . nitroGLYCERIN (NITROSTAT) 0.4 MG SL tablet Place 0.4 mg under the tongue every 5 (five) minutes as needed. For chest pain      . omega-3 acid ethyl esters (LOVAZA) 1 G capsule Take 1 g by mouth 3 (three) times daily.      Marland Kitchen omeprazole (PRILOSEC) 20 MG capsule Take 20 mg by mouth 2 (two) times daily.      . Red Yeast Rice 600 MG CAPS Take 600 mg by mouth 2 (two) times daily.      . valsartan-hydrochlorothiazide (DIOVAN HCT) 160-12.5 MG per tablet Take 1 tablet by mouth daily.  90 tablet  3   No facility-administered medications prior to visit.    PAST MEDICAL HISTORY: Past Medical History  Diagnosis Date  . IHD (ischemic heart disease)   . Arthritis   . Gout   . Sinus congestion   . GERD (gastroesophageal reflux disease)   . Hypertension   . Hyperlipidemia   . Neuropathy     In both legs, below knee  . Depression   . Anxiety disorder   . Cancer     Prostate    . Gait disorder 03/22/2013  . Peripheral neuropathy     Probable  . Coronary artery disease   . Foot drop, bilateral 03/22/2013    PAST SURGICAL HISTORY: Past Surgical History  Procedure Laterality Date  . Cardiac catheterization  09/24/2004    EF 60%  . Coronary artery bypass graft  2004  . US echocardiography  01/19/2008    EF 55-60%  . Cardiovascular stress test  07/17/2010    EF 61%  . Radium seed implant      Prostate cancer  . Tonsillectomy    . Hemorrhoid surgery    . Cataract extraction, bilateral      FAMILY HISTORY: Family History  Problem Relation Age of Onset  . Alzheimer's disease Mother 77  . Coronary artery disease Father   . Prostate cancer Father     SOCIAL HISTORY: History   Social History  . Marital Status: Married    Spouse Name: N/A    Number of Children: N/A  . Years of Education: N/A   Occupational History  . Not on file.   Social History Main Topics  . Smoking status: Former Smoker    Quit date: 08/01/1991  . Smokeless tobacco: Not on file  . Alcohol Use: No  . Drug Use: No  . Sexual Activity: Not on file   Other Topics Concern  . Not on file   Social History Narrative  . No narrative on file      PHYSICAL EXAM  Filed Vitals:   09/22/14 0825  BP: 138/75  Pulse: 67  Weight: 202 lb (91.627 kg)   Body mass index is 27.39 kg/(m^2).  Generalized: Well developed, in no acute distress   Neurological examination  Mentation: Alert oriented to time, place, history taking. Follows all commands speech and language fluent Cranial nerve II-XII: Pupils were equal round reactive to light. Extraocular movements were full, visual field were full on confrontational test. Facial sensation and strength were normal. Uvula tongue midline. Head turning and shoulder shrug  were normal and symmetric. Motor: The motor testing reveals 5 over 5 strength of all 4 extremities. Good symmetric motor tone is noted throughout.  Sensory: Sensory testing  is intact to soft touch on all 4 extremities. No evidence of extinction is noted.  Coordination: Cerebellar testing reveals  good finger-nose-finger and heel-to-shin bilaterally.  Gait and station: Gait is slightly wide based, uses a cane to ambulate. Is wearing AFO braces. Tandem gait not attempted. Romberg is positive. Reflexes: Deep tendon reflexes are symmetric but depressed.  DIAGNOSTIC DATA (LABS, IMAGING, TESTING) - I reviewed patient records, labs, notes, testing and imaging myself where available.  Lab Results  Component Value Date   WBC 7.7 01/24/2013   HGB 14.7 01/24/2013   HCT 40.5 01/24/2013   MCV 88.4 01/24/2013   PLT 158 01/24/2013      Component Value Date/Time   NA 139 09/18/2014 0748   K 3.8 09/18/2014 0748   CL 102 09/18/2014 0748   CO2 29 09/18/2014 0748   GLUCOSE 97 09/18/2014 0748   BUN 20 09/18/2014 0748   CREATININE 1.0 09/18/2014 0748   CALCIUM 9.5 09/18/2014 0748   PROT 7.3 09/18/2014 0748   ALBUMIN 4.1 09/18/2014 0748   AST 24 09/18/2014 0748   ALT 26 09/18/2014 0748   ALKPHOS 60 09/18/2014 0748   BILITOT 0.7 09/18/2014 0748   GFRNONAA 81* 01/24/2013 1427   GFRAA >90 01/24/2013 1427   Lab Results  Component Value Date   CHOL 143 09/18/2014   HDL 27.90* 09/18/2014   LDLCALC 88 09/18/2014   TRIG 137.0 09/18/2014   CHOLHDL 5 09/18/2014       ASSESSMENT AND PLAN 79 y.o. year old male  has a past medical history of IHD (ischemic heart disease); Arthritis; Gout; Sinus congestion; GERD (gastroesophageal reflux disease); Hypertension; Hyperlipidemia; Neuropathy; Depression; Anxiety disorder; Cancer; Gait disorder (03/22/2013); Peripheral neuropathy; Coronary artery disease; and Foot drop, bilateral (03/22/2013). here with:  1. Peripheral neuropathy 2. Bilateral foot drops  Overall patient is doing well. He does not have any discomfort associated with his neuropathy. He should continue uses the AFO braces and a cane when he ambulates. If his symptoms worsen or if he develops  new symptoms he should let us know. Otherwise he will follow-up in 1 year or sooner if needed.   Ward Givens, MSN, NP-C 09/22/2014, 8:29 AM [facility] [address] [place], [place] [address] [phone]  Note: This document was prepared with digital dictation and possible smart phrase technology. Any transcriptional errors that result from this process are unintentional.

## 2014-09-22 NOTE — Addendum Note (Signed)
Addended by: Trudie Buckler on: 09/22/2014 11:52 AM   Modules accepted: Level of Service

## 2014-09-22 NOTE — Progress Notes (Signed)
I have read the note, and I agree with the clinical assessment and plan.  Ronda Rajkumar,Domon[place] KEITH   

## 2014-09-22 NOTE — Patient Instructions (Signed)

## 2014-09-22 NOTE — Addendum Note (Signed)
Addended by: Trudie Buckler on: 09/22/2014 12:36 PM   Modules accepted: Level of Service

## 2015-03-19 ENCOUNTER — Other Ambulatory Visit (INDEPENDENT_AMBULATORY_CARE_PROVIDER_SITE_OTHER): Payer: Medicare Other | Admitting: *Deleted

## 2015-03-19 DIAGNOSIS — I119 Hypertensive heart disease without heart failure: Secondary | ICD-10-CM

## 2015-03-19 DIAGNOSIS — E785 Hyperlipidemia, unspecified: Secondary | ICD-10-CM | POA: Diagnosis not present

## 2015-03-19 LAB — LIPID PANEL
CHOLESTEROL: 140 mg/dL (ref 0–200)
HDL: 33.1 mg/dL — AB (ref >39.00)
LDL Cholesterol: 87 mg/dL (ref 0–99)
NonHDL: 106.9
Total CHOL/HDL Ratio: 4
Triglycerides: 102 mg/dL (ref 0.0–149.0)
VLDL: 20.4 mg/dL (ref 0.0–40.0)

## 2015-03-19 LAB — BASIC METABOLIC PANEL
BUN: 20 mg/dL (ref 6–23)
CO2: 25 mEq/L (ref 19–32)
Calcium: 9.5 mg/dL (ref 8.4–10.5)
Chloride: 104 mEq/L (ref 96–112)
Creatinine, Ser: 1.23 mg/dL (ref 0.40–1.50)
GFR: 60.58 mL/min (ref >60.00)
Glucose, Bld: 94 mg/dL (ref 70–99)
Potassium: 4.1 mEq/L (ref 3.5–5.1)
Sodium: 139 mEq/L (ref 135–145)

## 2015-03-19 LAB — HEPATIC FUNCTION PANEL
ALBUMIN: 4.2 g/dL (ref 3.5–5.2)
ALT: 30 U/L (ref 0–53)
AST: 26 U/L (ref 0–37)
Alkaline Phosphatase: 56 U/L (ref 39–117)
BILIRUBIN TOTAL: 0.5 mg/dL (ref 0.2–1.2)
Bilirubin, Direct: 0.1 mg/dL (ref 0.0–0.3)
Total Protein: 6.9 g/dL (ref 6.0–8.3)

## 2015-03-19 NOTE — Progress Notes (Signed)
Quick Note:  Please make copy of labs for patient visit. ______ 

## 2015-03-21 ENCOUNTER — Encounter: Payer: Self-pay | Admitting: Cardiology

## 2015-03-21 ENCOUNTER — Ambulatory Visit (INDEPENDENT_AMBULATORY_CARE_PROVIDER_SITE_OTHER): Payer: Medicare Other | Admitting: Cardiology

## 2015-03-21 VITALS — BP 124/68 | HR 75 | Ht 72.0 in | Wt 205.8 lb

## 2015-03-21 DIAGNOSIS — E785 Hyperlipidemia, unspecified: Secondary | ICD-10-CM | POA: Diagnosis not present

## 2015-03-21 DIAGNOSIS — Z951 Presence of aortocoronary bypass graft: Secondary | ICD-10-CM

## 2015-03-21 DIAGNOSIS — R0609 Other forms of dyspnea: Secondary | ICD-10-CM

## 2015-03-21 DIAGNOSIS — I119 Hypertensive heart disease without heart failure: Secondary | ICD-10-CM | POA: Diagnosis not present

## 2015-03-21 NOTE — Progress Notes (Signed)
Cardiology Office Note   Date:  03/21/2015   ID:  Steven Cameron, DOB 1935-06-01, MRN [id number]  PCP:  Imagene Riches, NP  Cardiologist:   Darlin Coco, MD   No chief complaint on file.     History of Present Illness: Steven Cameron is a 80 y.o. male who presents for a six-month follow-up office visit This pleasant 81 year old gentleman is seen for a scheduled followup office visit. He has a past history of ischemic heart disease. He had coronary artery bypass graft surgery in 2004. He also has a history of previous gout. 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 He saw Dr. Jannifer Franklin concerning this about 2 or 3 years ago and will be seeing him in followup next week. He now takes allopurinol . He has a history of high blood pressure. He has not been having any chest pain.  He has had some recent exertional dyspnea.  He went to [facility] on 01/29/15 and had a chest x-ray which showed small bilateral pleural effusions but no overt pulmonary edema or consolidation and the reticular nodular pattern at the lung bases was similar to prior exams. He has a new PCP. It is Heide Scales NP at the  [facility]. His last echocardiogram was on 01/19/08 and at that time he had normal left ventricular systolic function and had diastolic dysfunction. The patient is on allopurinol.  He has not had any flare of his gout.  He is allergic to colchicine which is blamed for his neuropathy.   Past Medical History  Diagnosis Date  . IHD (ischemic heart disease)   . Arthritis   . Gout   . Sinus congestion   . GERD (gastroesophageal reflux disease)   . Hypertension   . Hyperlipidemia   . Neuropathy     In both legs, below knee  . Depression   . Anxiety disorder   . Cancer     Prostate  . Gait disorder 03/22/2013  . Peripheral neuropathy     Probable  . Coronary artery disease   . Foot drop, bilateral 03/22/2013    Past Surgical History  Procedure  Laterality Date  . Cardiac catheterization  09/24/2004    EF 60%  . Coronary artery bypass graft  2004  . US echocardiography  01/19/2008    EF 55-60%  . Cardiovascular stress test  07/17/2010    EF 61%  . Radium seed implant      Prostate cancer  . Tonsillectomy    . Hemorrhoid surgery    . Cataract extraction, bilateral       Current Outpatient Prescriptions  Medication Sig Dispense Refill  . allopurinol (ZYLOPRIM) 100 MG tablet Take 1 tablet (100 mg total) by mouth 2 (two) times daily. 60 tablet 11  . ALPRAZolam (XANAX) 0.5 MG tablet Take 1 tablet (0.5 mg total) by mouth 3 (three) times daily as needed. 270 tablet 1  . aspirin 81 MG EC tablet Take 81 mg by mouth daily.      . B Complex-C (SUPER B COMPLEX PO) Take 1 tablet by mouth daily.    . cholecalciferol (VITAMIN D) 1000 UNITS tablet Take 1,000 Units by mouth daily.    . clopidogrel (PLAVIX) 75 MG tablet Take 75 mg by mouth at bedtime.     Marland Kitchen HYDROcodone-acetaminophen (NORCO/VICODIN) 5-325 MG per tablet Take 5-325 tablets by mouth every 6 (six) hours as needed.     . lovastatin (MEVACOR) 10  MG tablet Take 10 mg by mouth daily.    . Misc Natural Products (OSTEO BI-FLEX JOINT SHIELD PO) Take 1 tablet by mouth 2 (two) times daily.     . nitroGLYCERIN (NITROSTAT) 0.4 MG SL tablet Place 0.4 mg under the tongue every 5 (five) minutes as needed. For chest pain    . omega-3 acid ethyl esters (LOVAZA) 1 G capsule Take 1 g by mouth 3 (three) times daily.    Marland Kitchen omeprazole (PRILOSEC) 20 MG capsule Take 20 mg by mouth 2 (two) times daily.    . Red Yeast Rice 600 MG CAPS Take 600 mg by mouth 2 (two) times daily.    . valsartan-hydrochlorothiazide (DIOVAN HCT) 160-12.5 MG per tablet Take 1 tablet by mouth daily. 90 tablet 3   No current facility-administered medications for this visit.    Allergies:   Lyrica; Statins; and Ibuprofen    Social History:  The patient  reports that he quit smoking about 23 years ago. He does not have any  smokeless tobacco history on file. He reports that he does not drink alcohol or use illicit drugs.   Family History:  The patient's family history includes Alzheimer's disease (age of onset: 46) in his mother; Coronary artery disease in his father; Prostate cancer in his father.    ROS:  Please see the history of present illness.   Otherwise, review of systems are positive for none.   All other systems are reviewed and negative.    PHYSICAL EXAM: VS:  BP 124/68 mmHg  Pulse 75  Ht 6' (1.829 m)  Wt 205 lb 12.8 oz (93.35 kg)  BMI 27.91 kg/m2 , BMI Body mass index is 27.91 kg/(m^2). GEN: Well nourished, well developed, in no acute distress HEENT: normal Neck: no JVD, carotid bruits, or masses Cardiac: RRR; no murmurs, rubs, or gallops,no edema  Respiratory:  clear to auscultation bilaterally, normal work of breathing GI: soft, nontender, nondistended, + BS MS: no deformity or atrophy Skin: warm and dry, no rash Neuro:  Strength and sensation are intact Psych: euthymic mood, full affect   EKG:  EKG is not ordered today.    Recent Labs: 03/19/2015: ALT 30; BUN 20; Creatinine 1.23; Potassium 4.1; Sodium 139    Lipid Panel    Component Value Date/Time   CHOL 140 03/19/2015 0913   TRIG 102.0 03/19/2015 0913   HDL 33.10* 03/19/2015 0913   CHOLHDL 4 03/19/2015 0913   VLDL 20.4 03/19/2015 0913   LDLCALC 87 03/19/2015 0913      Wt Readings from Last 3 Encounters:  03/21/15 205 lb 12.8 oz (93.35 kg)  09/22/14 202 lb (91.627 kg)  09/18/14 197 lb (89.359 kg)      Other studies Reviewed:    ASSESSMENT AND PLAN:  1.  Ischemic heart disease status post CABG in 2004.  No recurrent angina pectoris. 2.  Exertional dyspnea.  Past history of diastolic dysfunction.  Recent [facility] at [facility] shows small bilateral pleural effusions.  We will update his echocardiogram. 3.  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  Dyslipidemia 5.   Essential hypertension  Disposition: Obtain 2-D echo.  Continue current medication.  Recheck in 6 months for office visit EKG lipid panel hepatic function panel and basal metabolic panel   Current medicines are reviewed at length with the patient today.  The patient does not have concerns regarding medicines.  The following changes have been made:  no change  Labs/ tests ordered today  include:   Orders Placed This Encounter  Procedures  . Lipid panel  . Hepatic function panel  . Basic metabolic panel  . 2D Echocardiogram without contrast        Signed, Darlin Coco, MD  03/21/2015 4:55 PM    [facility] [address], [place], [place]  [address] Phone: [phone]; Fax: [phone]

## 2015-03-21 NOTE — Patient Instructions (Signed)
Your physician has requested that you have an echocardiogram. Echocardiography is a painless test that uses sound waves to create images of your heart. It provides your doctor with information about the size and shape of your heart and how well your heart's chambers and valves are working. This procedure takes approximately one hour. There are no restrictions for this procedure.  Your physician recommends that you continue on your current medications as directed. Please refer to the Current Medication list given to you today.  Your physician wants you to follow-up in: 6 months with fasting labs (lp/bmet/hfp) AND EKG You will receive a reminder letter in the mail two months in advance. If you don't receive a letter, please call our office to schedule the follow-up appointment.

## 2015-03-27 ENCOUNTER — Ambulatory Visit (HOSPITAL_COMMUNITY): Payer: Medicare Other | Attending: Cardiology | Admitting: Cardiology

## 2015-03-27 DIAGNOSIS — E785 Hyperlipidemia, unspecified: Secondary | ICD-10-CM | POA: Insufficient documentation

## 2015-03-27 DIAGNOSIS — Z87891 Personal history of nicotine dependence: Secondary | ICD-10-CM | POA: Diagnosis not present

## 2015-03-27 DIAGNOSIS — R0609 Other forms of dyspnea: Secondary | ICD-10-CM

## 2015-03-27 DIAGNOSIS — I10 Essential (primary) hypertension: Secondary | ICD-10-CM | POA: Diagnosis not present

## 2015-03-27 NOTE — Progress Notes (Signed)
Echo performed. 

## 2015-03-28 ENCOUNTER — Telehealth: Payer: Self-pay | Admitting: Cardiology

## 2015-03-28 DIAGNOSIS — Z79899 Other long term (current) drug therapy: Secondary | ICD-10-CM

## 2015-03-28 MED ORDER — FUROSEMIDE 20 MG PO TABS: 20.0000 mg | ORAL_TABLET | Freq: Every day | ORAL | Status: DC

## 2015-03-28 NOTE — Telephone Encounter (Signed)
New message     Returning Melinda's call

## 2015-03-28 NOTE — Telephone Encounter (Signed)
Advised of results and scheduled follow up labs

## 2015-03-28 NOTE — Telephone Encounter (Signed)
-----  Message from Darlin Coco, MD sent at 03/28/2015 10:21 AM EDT ----- Please report.  The echo shows diastolic dysfunction (also present on prior echo) and mild mitral regurgitation. His dyspnea should improve with additional diuretic. Add lasix 20 mg daily. Recheck BMET in two weeks.

## 2015-04-12 ENCOUNTER — Other Ambulatory Visit (INDEPENDENT_AMBULATORY_CARE_PROVIDER_SITE_OTHER): Payer: Medicare Other | Admitting: *Deleted

## 2015-04-12 ENCOUNTER — Other Ambulatory Visit: Payer: Self-pay

## 2015-04-12 DIAGNOSIS — Z79899 Other long term (current) drug therapy: Secondary | ICD-10-CM

## 2015-04-12 DIAGNOSIS — I119 Hypertensive heart disease without heart failure: Secondary | ICD-10-CM

## 2015-04-12 LAB — BASIC METABOLIC PANEL
BUN: 26 mg/dL — ABNORMAL HIGH (ref 6–23)
CALCIUM: 9.4 mg/dL (ref 8.4–10.5)
CO2: 27 mEq/L (ref 19–32)
CREATININE: 1.21 mg/dL (ref 0.40–1.50)
Chloride: 102 mEq/L (ref 96–112)
GFR: 61.73 mL/min (ref >60.00)
Glucose, Bld: 109 mg/dL — ABNORMAL HIGH (ref 70–99)
Potassium: 3.6 mEq/L (ref 3.5–5.1)
Sodium: 137 mEq/L (ref 135–145)

## 2015-04-12 MED ORDER — POTASSIUM CHLORIDE CRYS ER 20 MEQ PO TBCR: 20.0000 meq | EXTENDED_RELEASE_TABLET | Freq: Every day | ORAL | Status: DC

## 2015-05-14 ENCOUNTER — Other Ambulatory Visit (INDEPENDENT_AMBULATORY_CARE_PROVIDER_SITE_OTHER): Payer: Medicare Other | Admitting: *Deleted

## 2015-05-14 DIAGNOSIS — I119 Hypertensive heart disease without heart failure: Secondary | ICD-10-CM | POA: Diagnosis not present

## 2015-05-14 LAB — BASIC METABOLIC PANEL
BUN: 22 mg/dL (ref 6–23)
CO2: 31 mEq/L (ref 19–32)
CREATININE: 1.29 mg/dL (ref 0.40–1.50)
Calcium: 9.9 mg/dL (ref 8.4–10.5)
Chloride: 98 mEq/L (ref 96–112)
GFR: 57.32 mL/min — ABNORMAL LOW (ref >60.00)
Glucose, Bld: 93 mg/dL (ref 70–99)
Potassium: 3.9 mEq/L (ref 3.5–5.1)
Sodium: 135 mEq/L (ref 135–145)

## 2015-05-14 NOTE — Progress Notes (Signed)
Quick Note:  Please report to patient. The recent labs are stable. Continue same medication and careful diet. ______

## 2015-05-14 NOTE — Addendum Note (Signed)
Addended by: Eulis Foster on: 05/14/2015 08:54 AM   Modules accepted: Orders

## 2015-09-21 ENCOUNTER — Other Ambulatory Visit: Payer: Self-pay | Admitting: Cardiology

## 2015-09-24 ENCOUNTER — Ambulatory Visit (INDEPENDENT_AMBULATORY_CARE_PROVIDER_SITE_OTHER): Payer: Medicare Other | Admitting: Adult Health

## 2015-09-24 ENCOUNTER — Encounter: Payer: Self-pay | Admitting: Adult Health

## 2015-09-24 VITALS — BP 122/63 | HR 67 | Ht 72.0 in | Wt 194.0 lb

## 2015-09-24 DIAGNOSIS — M21372 Foot drop, left foot: Secondary | ICD-10-CM

## 2015-09-24 DIAGNOSIS — R269 Unspecified abnormalities of gait and mobility: Secondary | ICD-10-CM | POA: Diagnosis not present

## 2015-09-24 DIAGNOSIS — M21371 Foot drop, right foot: Secondary | ICD-10-CM

## 2015-09-24 DIAGNOSIS — G609 Hereditary and idiopathic neuropathy, unspecified: Secondary | ICD-10-CM | POA: Diagnosis not present

## 2015-09-24 NOTE — Progress Notes (Signed)
PATIENT: Steven Cameron DOB: 05/28/1935  REASON FOR VISIT: follow up-peripheral neuropathy with bilateral foot drops HISTORY FROM: patient  HISTORY OF PRESENT ILLNESS: Steven Cameron is a 80 year old male with a history of a peripheral neuropathy with bilateral foot drops. He returns today for follow-up. The patient states that he has not been wearing his AFO braces because the Velcro no longer works. He states that he tried to get new braces however they were in a caused him $1400.00. He states that he's been using a cane to ambulate with. Denies any falls. The patient does not notice any his comfort in the lower extremities. He states that he was seen a chiropractor and participating in a program to rebuild the nerves in his legs. He now states that that was a "waste of time." He denies any new neurological symptoms. He returns today for an evaluation.  HISTORY  09/22/14: Steven Cameron is a 80 year old male with a history of a peripheral neuropathy with bilateral foot drops. He returns today for follow-up. He does not have any discomfort associated with his neuropathy. He has AFO braces for his foot drops. He is currently going to a chiropractor for a "rebuilding the nerves program." He feels that he has noticed some mild improvement since starting that program. His states that his gait has remained the same. He uses a cane with ambulating. He a fall a month ago. He states that he did not pick up his foot high enough and tripped.    HISTORY 09/22/13 (CW): 80 year old right-handed white male with a history of a peripheral neuropathy associated with bilateral foot drops. The patient does fall on occasion, usually once or twice a week. The patient indicates that his falls generally are while he is outside doing yard work, on uneven ground. The patient has gotten bilateral AFO braces that have helped him. The patient does have a cane, but he does  not use this when he is out of the house. The patient has arthritis-related pain, but he denies any discomfort associated with the peripheral neuropathy. The patient will occasionally have hydrocodone to take if he needs it. Previously, Lyrica resulted in too much gait instability. The patient returns for an evaluation.   REVIEW OF SYSTEMS: Out of a complete 14 system review of symptoms, the patient complains only of the following symptoms, and all other reviewed systems are negative.  Shortness of breath, bruise/bleed easily  ALLERGIES: Allergies  Allergen Reactions  . Lyrica [Pregabalin]     Gait instability  . Statins Other (See Comments)    Weakness of legs  . Ibuprofen Rash    HOME MEDICATIONS: Outpatient Prescriptions Prior to Visit  Medication Sig Dispense Refill  . allopurinol (ZYLOPRIM) 100 MG tablet Take 1 tablet (100 mg total) by mouth 2 (two) times daily. 60 tablet 11  . ALPRAZolam (XANAX) 0.5 MG tablet Take 1 tablet (0.5 mg total) by mouth 3 (three) times daily as needed. 270 tablet 1  . aspirin 81 MG EC tablet Take 81 mg by mouth daily.      . B Complex-C (SUPER B COMPLEX PO) Take 1 tablet by mouth daily.    . cholecalciferol (VITAMIN D) 1000 UNITS tablet Take 1,000 Units by mouth daily.    . clopidogrel (PLAVIX) 75 MG tablet Take 75 mg by mouth at bedtime.     . furosemide (LASIX) 20 MG tablet TAKE 1 TABLET BY MOUTH DAILY 90 tablet 0  . HYDROcodone-acetaminophen (NORCO/VICODIN) 5-325 MG per  tablet Take 5-325 tablets by mouth every 6 (six) hours as needed.     . lovastatin (MEVACOR) 10 MG tablet Take 10 mg by mouth daily.    . Misc Natural Products (OSTEO BI-FLEX JOINT SHIELD PO) Take 1 tablet by mouth 2 (two) times daily.     . nitroGLYCERIN (NITROSTAT) 0.4 MG SL tablet Place 0.4 mg under the tongue every 5 (five) minutes as needed. For chest pain    . omega-3 acid ethyl esters (LOVAZA) 1 G capsule Take 1 g by mouth 3 (three) times daily.    Marland Kitchen omeprazole (PRILOSEC) 20  MG capsule Take 20 mg by mouth 2 (two) times daily.    . potassium chloride SA (K-DUR,KLOR-CON) 20 MEQ tablet Take 1 tablet (20 mEq total) by mouth daily. 30 tablet 6  . Red Yeast Rice 600 MG CAPS Take 600 mg by mouth 2 (two) times daily.    . valsartan-hydrochlorothiazide (DIOVAN HCT) 160-12.5 MG per tablet Take 1 tablet by mouth daily. 90 tablet 3   No facility-administered medications prior to visit.    PAST MEDICAL HISTORY: Past Medical History  Diagnosis Date  . IHD (ischemic heart disease)   . Arthritis   . Gout   . Sinus congestion   . GERD (gastroesophageal reflux disease)   . Hypertension   . Hyperlipidemia   . Neuropathy     In both legs, below knee  . Depression   . Anxiety disorder   . Cancer     Prostate  . Gait disorder 03/22/2013  . Peripheral neuropathy     Probable  . Coronary artery disease   . Foot drop, bilateral 03/22/2013    PAST SURGICAL HISTORY: Past Surgical History  Procedure Laterality Date  . Cardiac catheterization  09/24/2004    EF 60%  . Coronary artery bypass graft  2004  . US echocardiography  01/19/2008    EF 55-60%  . Cardiovascular stress test  07/17/2010    EF 61%  . Radium seed implant      Prostate cancer  . Tonsillectomy    . Hemorrhoid surgery    . Cataract extraction, bilateral      FAMILY HISTORY: Family History  Problem Relation Age of Onset  . Alzheimer's disease Mother 45  . Coronary artery disease Father   . Prostate cancer Father     SOCIAL HISTORY: Social History   Social History  . Marital Status: Married    Spouse Name: N/A  . Number of Children: 2  . Years of Education: 14   Occupational History  .      retired   Social History Main Topics  . Smoking status: Former Smoker    Quit date: 08/01/1991  . Smokeless tobacco: Never Used  . Alcohol Use: No  . Drug Use: No  . Sexual Activity: Not on file   Other Topics Concern  . Not on file   Social History Narrative   Patient lives at home with his  wife Steven Cameron).   Retired.    Education some college.Right handed.   Caffeine coffee four cups .          PHYSICAL EXAM  Filed Vitals:   09/24/15 1043  BP: 122/63  Pulse: 67  Height: 6' (1.829 m)  Weight: 194 lb (87.998 kg)   Body mass index is 26.31 kg/(m^2).  Generalized: Well developed, in no acute distress   Neurological examination  Mentation: Alert oriented to time, place, history taking. Follows all commands speech  and language fluent.  Cranial nerve II-XII: Pupils were equal round reactive to light. Extraocular movements were full, visual field were full on confrontational test. Facial sensation and strength were normal. Uvula tongue midline. Head turning and shoulder shrug  were normal and symmetric. Motor: The motor testing reveals 5 over 5 strength of all 4 extremities. Good symmetric motor tone is noted throughout.  Sensory: Sensory testing is intact to soft touch on all 4 extremities. No evidence of extinction is noted.  Coordination: Cerebellar testing reveals good finger-nose-finger and heel-to-shin bilaterally.  Gait and station: Patient has a bilateral steppage gait due to foot drops. Gait is wide-based. He uses a cane. Tandem gait not attended. Reflexes: Deep tendon reflexes are symmetric and normal bilaterally.   DIAGNOSTIC DATA (LABS, IMAGING, TESTING) - I reviewed patient records, labs, notes, testing and imaging myself where available.    ASSESSMENT AND PLAN 80 y.o. year old male  has a past medical history of IHD (ischemic heart disease); Arthritis; Gout; Sinus congestion; GERD (gastroesophageal reflux disease); Hypertension; Hyperlipidemia; Neuropathy; Depression; Anxiety disorder; Cancer; Gait disorder (03/22/2013); Peripheral neuropathy; Coronary artery disease; and Foot drop, bilateral (03/22/2013). here with:  1. Peripheral neuropathy 2. Abnormality of gait 3. Bilateral foot drops  Overall the patient has remained stable. He does not have any  discomfort associated with his neuropathy. At this time the patient does not want to wear AFO braces. Fortunately he is not had any falls. I have advised patient that if he begins to have frequent falls he will need to reconsider this. Patient verbalized understanding. He will follow-up in one year with Dr. Jannifer Franklin.  Ward Givens, MSN, NP-C 09/24/2015, 10:55 AM [facility] [address], [place], [place] [address] [phone]

## 2015-09-24 NOTE — Patient Instructions (Addendum)
Let me know if you consider AFO braces If your symptoms worsen or you develop new symptoms please let us know.

## 2015-09-24 NOTE — Progress Notes (Signed)
I agree with the above plan 

## 2015-10-04 ENCOUNTER — Ambulatory Visit (INDEPENDENT_AMBULATORY_CARE_PROVIDER_SITE_OTHER): Payer: Medicare Other | Admitting: Cardiology

## 2015-10-04 ENCOUNTER — Other Ambulatory Visit (INDEPENDENT_AMBULATORY_CARE_PROVIDER_SITE_OTHER): Payer: Medicare Other

## 2015-10-04 ENCOUNTER — Encounter: Payer: Self-pay | Admitting: Cardiology

## 2015-10-04 VITALS — BP 134/62 | HR 64 | Ht 73.0 in | Wt 194.1 lb

## 2015-10-04 DIAGNOSIS — Z951 Presence of aortocoronary bypass graft: Secondary | ICD-10-CM | POA: Diagnosis not present

## 2015-10-04 DIAGNOSIS — E785 Hyperlipidemia, unspecified: Secondary | ICD-10-CM

## 2015-10-04 DIAGNOSIS — R0609 Other forms of dyspnea: Secondary | ICD-10-CM

## 2015-10-04 DIAGNOSIS — I119 Hypertensive heart disease without heart failure: Secondary | ICD-10-CM | POA: Diagnosis not present

## 2015-10-04 LAB — BASIC METABOLIC PANEL
BUN: 25 mg/dL (ref 7–25)
CO2: 29 mmol/L (ref 20–31)
Calcium: 9.7 mg/dL (ref 8.6–10.3)
Chloride: 100 mmol/L (ref 98–110)
Creat: 1.2 mg/dL — ABNORMAL HIGH (ref 0.70–1.18)
Glucose, Bld: 96 mg/dL (ref 65–99)
Potassium: 4.3 mmol/L (ref 3.5–5.3)
SODIUM: 137 mmol/L (ref 135–146)

## 2015-10-04 LAB — HEPATIC FUNCTION PANEL
ALBUMIN: 4.4 g/dL (ref 3.6–5.1)
ALT: 26 U/L (ref 9–46)
AST: 24 U/L (ref 10–35)
Alkaline Phosphatase: 59 U/L (ref 40–115)
BILIRUBIN TOTAL: 0.6 mg/dL (ref 0.2–1.2)
Bilirubin, Direct: 0.1 mg/dL (ref <=0.2)
Indirect Bilirubin: 0.5 mg/dL (ref 0.2–1.2)
TOTAL PROTEIN: 7.5 g/dL (ref 6.1–8.1)

## 2015-10-04 LAB — LIPID PANEL
CHOL/HDL RATIO: 4.2 ratio (ref <=5.0)
Cholesterol: 148 mg/dL (ref 125–200)
HDL: 35 mg/dL — AB (ref >=40)
LDL Cholesterol: 89 mg/dL (ref <130)
TRIGLYCERIDES: 119 mg/dL (ref <150)
VLDL: 24 mg/dL (ref <30)

## 2015-10-04 NOTE — Progress Notes (Signed)
Cardiology Office Note   Date:  10/04/2015   ID:  JOHNATHYN Cameron, DOB 1935-10-14, MRN [id number]  PCP:  Imagene Riches, NP  Cardiologist: Darlin Coco MD  No chief complaint on file.     History of Present Illness: Steven Cameron is a 80 y.o. male who presents for a six-month follow-up visit This pleasant 80 year old gentleman is seen for a scheduled followup office visit. He has a past history of ischemic heart disease. He had coronary artery bypass graft surgery in 2004. He also has a history of previous gout. 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  Is followed for this by Dr. Jannifer Franklin .  He now takes allopurinol . He has a history of high blood pressure. He has not been having any chest pain. He has had some recent exertional dyspnea. He went to [facility] on 01/29/15 and had a chest x-ray which showed small bilateral pleural effusions but no overt pulmonary edema or consolidation and the reticular nodular pattern at the lung bases was similar to prior exams. Subsequent echocardiogram on 03/27/15 showed an ejection fraction of 60-65% and there was grade 2 diastolic dysfunction and mild mitral regurgitation.  He was placed on low-dose furosemide 20 mg daily.  His dyspnea is improved. He has a new PCP. It is Heide Scales NP at the [facility]. The patient has not been expressing any chest pain or shortness of breath.  No palpitations.  No dizziness or syncope.  His weight is unchanged.   Past Medical History  Diagnosis Date  . IHD (ischemic heart disease)   . Arthritis   . Gout   . Sinus congestion   . GERD (gastroesophageal reflux disease)   . Hypertension   . Hyperlipidemia   . Neuropathy (HCC)     In both legs, below knee  . Depression   . Anxiety disorder   . Cancer [facility])     Prostate  . Gait disorder 03/22/2013  . Peripheral neuropathy (HCC)     Probable  . Coronary artery disease   . Foot drop, bilateral  03/22/2013    Past Surgical History  Procedure Laterality Date  . Cardiac catheterization  09/24/2004    EF 60%  . Coronary artery bypass graft  2004  . US echocardiography  01/19/2008    EF 55-60%  . Cardiovascular stress test  07/17/2010    EF 61%  . Radium seed implant      Prostate cancer  . Tonsillectomy    . Hemorrhoid surgery    . Cataract extraction, bilateral       Current Outpatient Prescriptions  Medication Sig Dispense Refill  . allopurinol (ZYLOPRIM) 100 MG tablet Take 1 tablet (100 mg total) by mouth 2 (two) times daily. 60 tablet 11  . ALPRAZolam (XANAX) 0.5 MG tablet Take 0.5 mg by mouth 3 (three) times daily as needed for anxiety (anxiety).    Marland Kitchen aspirin 81 MG EC tablet Take 81 mg by mouth daily.      . B Complex-C (SUPER B COMPLEX PO) Take 1 tablet by mouth daily.    . cholecalciferol (VITAMIN D) 1000 UNITS tablet Take 1,000 Units by mouth daily.    . clopidogrel (PLAVIX) 75 MG tablet Take 75 mg by mouth at bedtime.     . furosemide (LASIX) 20 MG tablet TAKE 1 TABLET BY MOUTH DAILY 90 tablet 0  . HYDROcodone-acetaminophen (NORCO/VICODIN) 5-325 MG per tablet Take 5-325 tablets by mouth  every 6 (six) hours as needed (pain).     Marland Kitchen lovastatin (MEVACOR) 10 MG tablet Take 10 mg by mouth daily.    . Misc Natural Products (OSTEO BI-FLEX JOINT SHIELD PO) Take 1 tablet by mouth 2 (two) times daily.     . nitroGLYCERIN (NITROSTAT) 0.4 MG SL tablet Place 0.4 mg under the tongue every 5 (five) minutes as needed. For chest pain    . omega-3 acid ethyl esters (LOVAZA) 1 G capsule Take 1 g by mouth 3 (three) times daily.    Marland Kitchen omeprazole (PRILOSEC) 20 MG capsule Take 20 mg by mouth 2 (two) times daily.    . potassium chloride SA (K-DUR,KLOR-CON) 20 MEQ tablet Take 1 tablet (20 mEq total) by mouth daily. 30 tablet 6  . Red Yeast Rice 600 MG CAPS Take 600 mg by mouth 2 (two) times daily.    . valsartan-hydrochlorothiazide (DIOVAN HCT) 160-12.5 MG per tablet Take 1 tablet by mouth  daily. 90 tablet 3   No current facility-administered medications for this visit.    Allergies:   Lyrica; Statins; and Ibuprofen    Social History:  The patient  reports that he quit smoking about 24 years ago. He has never used smokeless tobacco. He reports that he does not drink alcohol or use illicit drugs.   Family History:  The patient's family history includes Alzheimer's disease (age of onset: 74) in his mother; Coronary artery disease in his father; Prostate cancer in his father.    ROS:  Please see the history of present illness.   Otherwise, review of systems are positive for none.   All other systems are reviewed and negative.    PHYSICAL EXAM: VS:  BP 134/62 mmHg  Pulse 64  Ht _0  (1.854 m)  Wt 194 lb 1.9 oz (88.052 kg)  BMI 25.62 kg/m2 , BMI Body mass index is 25.62 kg/(m^2). GEN: Well nourished, well developed, in no acute distress HEENT: normal Neck: no JVD, carotid bruits, or masses Cardiac: RRR; no murmurs, rubs, or gallops,no edema  Respiratory:  clear to auscultation bilaterally, normal work of breathing GI: soft, nontender, nondistended, + BS MS: no deformity or atrophy Skin: warm and dry, no rash Neuro:  Strength and sensation are intact Psych: euthymic mood, full affect   EKG:  EKG is ordered today. The ekg ordered today demonstrates normal sinus rhythm.  Within normal limits.   Recent Labs: 03/19/2015: ALT 30 05/14/2015: BUN 22; Creatinine, Ser 1.29; Potassium 3.9; Sodium 135    Lipid Panel    Component Value Date/Time   CHOL 140 03/19/2015 0913   TRIG 102.0 03/19/2015 0913   HDL 33.10* 03/19/2015 0913   CHOLHDL 4 03/19/2015 0913   VLDL 20.4 03/19/2015 0913   LDLCALC 87 03/19/2015 0913      Wt Readings from Last 3 Encounters:  10/04/15 194 lb 1.9 oz (88.052 kg)  09/24/15 194 lb (87.998 kg)  03/21/15 205 lb 12.8 oz (93.35 kg)        ASSESSMENT AND PLAN:  1. Ischemic heart disease status post CABG in 2004. No recurrent angina  pectoris. 2. Exertional dyspnea. Past history of diastolic dysfunction. Recent chest x-ray at [facility] shows small bilateral pleural effusions. We will update his echocardiogram. 3. 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 Dyslipidemia 5. Essential hypertension  Disposition: Continue current medication. Recheck in 6 months for office visit  lipid panel hepatic function panel and basal metabolic panel   Current medicines are reviewed at  length with the patient today.  The patient does not have concerns regarding medicines.  The following changes have been made:  no change  Labs/ tests ordered today include:   Orders Placed This Encounter  Procedures  . Lipid panel  . Hepatic function panel  . Basic metabolic panel  . EKG 12-Lead       Signed, Darlin Coco MD 10/04/2015 9:41 AM    [facility] [address], [place], [place]  [address] Phone: [phone]; Fax: [phone]

## 2015-10-04 NOTE — Patient Instructions (Signed)
Medication Instructions:  Your physician recommends that you continue on your current medications as directed. Please refer to the Current Medication list given to you today.  Labwork: Lp/bmet/hfp  Testing/Procedures: none  Follow-Up: Your physician wants you to follow-up in:6 months with fasting labs (lp/bmet/hfp) with Brynda Rim PA

## 2015-10-04 NOTE — Progress Notes (Signed)
Quick Note:  Please report to patient. The recent labs are stable. Continue same medication and careful diet. ______ 

## 2015-11-06 ENCOUNTER — Other Ambulatory Visit: Payer: Self-pay | Admitting: Cardiology

## 2015-11-08 ENCOUNTER — Emergency Department (HOSPITAL_COMMUNITY): Payer: Medicare Other

## 2015-11-08 ENCOUNTER — Encounter (HOSPITAL_COMMUNITY): Payer: Self-pay | Admitting: Emergency Medicine

## 2015-11-08 ENCOUNTER — Inpatient Hospital Stay (HOSPITAL_COMMUNITY)
Admission: EM | Admit: 2015-11-08 | Discharge: 2015-11-10 | DRG: 189 | Disposition: A | Discharge status: Home / Self Care | Payer: Medicare Other | Attending: Internal Medicine | Admitting: Internal Medicine

## 2015-11-08 DIAGNOSIS — Z9841 Cataract extraction status, right eye: Secondary | ICD-10-CM

## 2015-11-08 DIAGNOSIS — K219 Gastro-esophageal reflux disease without esophagitis: Secondary | ICD-10-CM | POA: Diagnosis present

## 2015-11-08 DIAGNOSIS — E785 Hyperlipidemia, unspecified: Secondary | ICD-10-CM

## 2015-11-08 DIAGNOSIS — M21379 Foot drop, unspecified foot: Secondary | ICD-10-CM | POA: Diagnosis present

## 2015-11-08 DIAGNOSIS — Z79899 Other long term (current) drug therapy: Secondary | ICD-10-CM

## 2015-11-08 DIAGNOSIS — M1990 Unspecified osteoarthritis, unspecified site: Secondary | ICD-10-CM | POA: Diagnosis present

## 2015-11-08 DIAGNOSIS — N289 Disorder of kidney and ureter, unspecified: Secondary | ICD-10-CM | POA: Diagnosis present

## 2015-11-08 DIAGNOSIS — R7989 Other specified abnormal findings of blood chemistry: Secondary | ICD-10-CM

## 2015-11-08 DIAGNOSIS — F419 Anxiety disorder, unspecified: Secondary | ICD-10-CM | POA: Diagnosis present

## 2015-11-08 DIAGNOSIS — J849 Interstitial pulmonary disease, unspecified: Secondary | ICD-10-CM | POA: Diagnosis present

## 2015-11-08 DIAGNOSIS — I2510 Atherosclerotic heart disease of native coronary artery without angina pectoris: Secondary | ICD-10-CM | POA: Diagnosis present

## 2015-11-08 DIAGNOSIS — Z87891 Personal history of nicotine dependence: Secondary | ICD-10-CM

## 2015-11-08 DIAGNOSIS — Z888 Allergy status to other drugs, medicaments and biological substances status: Secondary | ICD-10-CM | POA: Diagnosis not present

## 2015-11-08 DIAGNOSIS — I272 Other secondary pulmonary hypertension: Secondary | ICD-10-CM | POA: Diagnosis not present

## 2015-11-08 DIAGNOSIS — M109 Gout, unspecified: Secondary | ICD-10-CM | POA: Diagnosis present

## 2015-11-08 DIAGNOSIS — J8410 Pulmonary fibrosis, unspecified: Secondary | ICD-10-CM | POA: Diagnosis present

## 2015-11-08 DIAGNOSIS — J9601 Acute respiratory failure with hypoxia: Secondary | ICD-10-CM | POA: Diagnosis present

## 2015-11-08 DIAGNOSIS — I5032 Chronic diastolic (congestive) heart failure: Secondary | ICD-10-CM | POA: Diagnosis present

## 2015-11-08 DIAGNOSIS — I110 Hypertensive heart disease with heart failure: Secondary | ICD-10-CM | POA: Diagnosis present

## 2015-11-08 DIAGNOSIS — I248 Other forms of acute ischemic heart disease: Secondary | ICD-10-CM | POA: Diagnosis present

## 2015-11-08 DIAGNOSIS — G629 Polyneuropathy, unspecified: Secondary | ICD-10-CM | POA: Diagnosis present

## 2015-11-08 DIAGNOSIS — I2119 ST elevation (STEMI) myocardial infarction involving other coronary artery of inferior wall: Secondary | ICD-10-CM | POA: Diagnosis present

## 2015-11-08 DIAGNOSIS — R0602 Shortness of breath: Secondary | ICD-10-CM | POA: Diagnosis not present

## 2015-11-08 DIAGNOSIS — Z7982 Long term (current) use of aspirin: Secondary | ICD-10-CM

## 2015-11-08 DIAGNOSIS — M899 Disorder of bone, unspecified: Secondary | ICD-10-CM

## 2015-11-08 DIAGNOSIS — Z8249 Family history of ischemic heart disease and other diseases of the circulatory system: Secondary | ICD-10-CM | POA: Diagnosis not present

## 2015-11-08 DIAGNOSIS — R0902 Hypoxemia: Secondary | ICD-10-CM | POA: Diagnosis not present

## 2015-11-08 DIAGNOSIS — Z7902 Long term (current) use of antithrombotics/antiplatelets: Secondary | ICD-10-CM

## 2015-11-08 DIAGNOSIS — Z9842 Cataract extraction status, left eye: Secondary | ICD-10-CM | POA: Diagnosis not present

## 2015-11-08 DIAGNOSIS — N179 Acute kidney failure, unspecified: Secondary | ICD-10-CM | POA: Diagnosis present

## 2015-11-08 DIAGNOSIS — Z8546 Personal history of malignant neoplasm of prostate: Secondary | ICD-10-CM | POA: Diagnosis not present

## 2015-11-08 DIAGNOSIS — Z951 Presence of aortocoronary bypass graft: Secondary | ICD-10-CM

## 2015-11-08 DIAGNOSIS — I214 Non-ST elevation (NSTEMI) myocardial infarction: Secondary | ICD-10-CM | POA: Diagnosis not present

## 2015-11-08 LAB — CBC WITH DIFFERENTIAL/PLATELET
BASOS PCT: 0 %
Basophils Absolute: 0 10*3/uL (ref 0.0–0.1)
EOS ABS: 0.1 10*3/uL (ref 0.0–0.7)
Eosinophils Relative: 2 %
HEMATOCRIT: 41.2 % (ref 39.0–52.0)
Hemoglobin: 13.7 g/dL (ref 13.0–17.0)
LYMPHS ABS: 1.3 10*3/uL (ref 0.7–4.0)
Lymphocytes Relative: 19 %
MCH: 31.4 pg (ref 26.0–34.0)
MCHC: 33.3 g/dL (ref 30.0–36.0)
MCV: 94.5 fL (ref 78.0–100.0)
MONO ABS: 0.3 10*3/uL (ref 0.1–1.0)
MONOS PCT: 5 %
Neutro Abs: 4.9 10*3/uL (ref 1.7–7.7)
Neutrophils Relative %: 74 %
Platelets: 185 10*3/uL (ref 150–400)
RBC: 4.36 MIL/uL (ref 4.22–5.81)
RDW: 14.4 % (ref 11.5–15.5)
WBC: 6.6 10*3/uL (ref 4.0–10.5)

## 2015-11-08 LAB — BASIC METABOLIC PANEL
Anion gap: 11 (ref 5–15)
BUN: 25 mg/dL — ABNORMAL HIGH (ref 6–20)
CALCIUM: 9.3 mg/dL (ref 8.9–10.3)
CHLORIDE: 102 mmol/L (ref 101–111)
CO2: 25 mmol/L (ref 22–32)
CREATININE: 1.35 mg/dL — AB (ref 0.61–1.24)
GFR calc Af Amer: 56 mL/min — ABNORMAL LOW (ref >60)
GFR calc non Af Amer: 49 mL/min — ABNORMAL LOW (ref >60)
GLUCOSE: 102 mg/dL — AB (ref 65–99)
Potassium: 4.7 mmol/L (ref 3.5–5.1)
Sodium: 138 mmol/L (ref 135–145)

## 2015-11-08 LAB — BRAIN NATRIURETIC PEPTIDE: B NATRIURETIC PEPTIDE 5: 125.4 pg/mL — AB (ref 0.0–100.0)

## 2015-11-08 LAB — D-DIMER, QUANTITATIVE: D-Dimer, Quant: 0.66 ug/mL-FEU — ABNORMAL HIGH (ref 0.00–0.48)

## 2015-11-08 LAB — TROPONIN I: TROPONIN I: 0.16 ng/mL — AB (ref <0.031)

## 2015-11-08 MED ORDER — ALPRAZOLAM 0.5 MG PO TABS
0.5000 mg | ORAL_TABLET | Freq: Three times a day (TID) | ORAL | Status: DC | PRN
  Administered 2015-11-08: 0.5 mg via ORAL
  Filled 2015-11-08: qty 1

## 2015-11-08 MED ORDER — OMEGA-3-ACID ETHYL ESTERS 1 G PO CAPS
1.0000 g | ORAL_CAPSULE | Freq: Three times a day (TID) | ORAL | Status: DC
  Administered 2015-11-09 – 2015-11-10 (×4): 1 g via ORAL
  Filled 2015-11-08 (×4): qty 1

## 2015-11-08 MED ORDER — ACETAMINOPHEN 325 MG PO TABS: 650.0000 mg | ORAL_TABLET | ORAL | Status: DC | PRN

## 2015-11-08 MED ORDER — ZOLPIDEM TARTRATE 5 MG PO TABS: 5.0000 mg | ORAL_TABLET | Freq: Every evening | ORAL | Status: DC | PRN

## 2015-11-08 MED ORDER — GI COCKTAIL ~~LOC~~
30.0000 mL | Freq: Four times a day (QID) | ORAL | Status: DC | PRN
Start: 2015-11-08 — End: 2015-11-10

## 2015-11-08 MED ORDER — IOHEXOL 350 MG/ML SOLN
75.0000 mL | Freq: Once | INTRAVENOUS | Status: AC | PRN
  Administered 2015-11-08: 100 mL via INTRAVENOUS

## 2015-11-08 MED ORDER — MORPHINE SULFATE (PF) 2 MG/ML IV SOLN: 2.0000 mg | INTRAVENOUS | Status: DC | PRN

## 2015-11-08 MED ORDER — IRBESARTAN 150 MG PO TABS
150.0000 mg | ORAL_TABLET | Freq: Every day | ORAL | Status: DC
  Administered 2015-11-09 – 2015-11-10 (×2): 150 mg via ORAL
  Filled 2015-11-08 (×2): qty 1

## 2015-11-08 MED ORDER — VITAMIN D 1000 UNITS PO TABS
1000.0000 [IU] | ORAL_TABLET | Freq: Every day | ORAL | Status: DC
  Administered 2015-11-09 – 2015-11-10 (×2): 1000 [IU] via ORAL
  Filled 2015-11-08 (×2): qty 1

## 2015-11-08 MED ORDER — VALSARTAN-HYDROCHLOROTHIAZIDE 160-12.5 MG PO TABS: 1.0000 | ORAL_TABLET | Freq: Every day | ORAL | Status: DC

## 2015-11-08 MED ORDER — PRAVASTATIN SODIUM 20 MG PO TABS
10.0000 mg | ORAL_TABLET | Freq: Every day | ORAL | Status: DC
  Administered 2015-11-09: 10 mg via ORAL
  Filled 2015-11-08: qty 1

## 2015-11-08 MED ORDER — ASPIRIN EC 325 MG PO TBEC
325.0000 mg | DELAYED_RELEASE_TABLET | Freq: Every day | ORAL | Status: DC
  Administered 2015-11-09 – 2015-11-10 (×2): 325 mg via ORAL
  Filled 2015-11-08 (×2): qty 1

## 2015-11-08 MED ORDER — HYDROCODONE-ACETAMINOPHEN 5-325 MG PO TABS: 5.0000 | ORAL_TABLET | Freq: Four times a day (QID) | ORAL | Status: DC | PRN

## 2015-11-08 MED ORDER — NITROGLYCERIN 0.4 MG SL SUBL: 0.4000 mg | SUBLINGUAL_TABLET | SUBLINGUAL | Status: DC | PRN

## 2015-11-08 MED ORDER — CLOPIDOGREL BISULFATE 75 MG PO TABS
75.0000 mg | ORAL_TABLET | Freq: Every day | ORAL | Status: DC
  Administered 2015-11-08 – 2015-11-09 (×2): 75 mg via ORAL
  Filled 2015-11-08 (×2): qty 1

## 2015-11-08 MED ORDER — HYDROCHLOROTHIAZIDE 12.5 MG PO CAPS
12.5000 mg | ORAL_CAPSULE | Freq: Every day | ORAL | Status: DC
  Administered 2015-11-09 – 2015-11-10 (×2): 12.5 mg via ORAL
  Filled 2015-11-08 (×3): qty 1

## 2015-11-08 MED ORDER — PANTOPRAZOLE SODIUM 40 MG PO TBEC
40.0000 mg | DELAYED_RELEASE_TABLET | Freq: Every day | ORAL | Status: DC
  Administered 2015-11-09 – 2015-11-10 (×2): 40 mg via ORAL
  Filled 2015-11-08 (×2): qty 1

## 2015-11-08 MED ORDER — ALBUTEROL SULFATE (2.5 MG/3ML) 0.083% IN NEBU
5.0000 mg | INHALATION_SOLUTION | Freq: Once | RESPIRATORY_TRACT | Status: AC
  Administered 2015-11-08: 5 mg via RESPIRATORY_TRACT
  Filled 2015-11-08: qty 6

## 2015-11-08 MED ORDER — ALLOPURINOL 100 MG PO TABS
100.0000 mg | ORAL_TABLET | Freq: Two times a day (BID) | ORAL | Status: DC
  Administered 2015-11-08 – 2015-11-10 (×4): 100 mg via ORAL
  Filled 2015-11-08 (×4): qty 1

## 2015-11-08 MED ORDER — ONDANSETRON HCL 4 MG/2ML IJ SOLN: 4.0000 mg | Freq: Four times a day (QID) | INTRAMUSCULAR | Status: DC | PRN

## 2015-11-08 NOTE — ED Provider Notes (Signed)
CSN: [id number]     Arrival date & time 11/08/15  1642 History   First MD Initiated Contact with Patient 11/08/15 1702     Chief Complaint  Patient presents with  . Shortness of Breath     (Consider location/radiation/quality/duration/timing/severity/associated sxs/prior Treatment) HPI Planes of shortness of breath onset several years ago, becoming worse today. Today he became "dizzy" meaning lightheaded with walking. Denies fever denies cough denies chest pain no other associated symptoms. He denies orthopnea. Denies cough denies fever. No treatment prior to coming here. Past Medical History  Diagnosis Date  . IHD (ischemic heart disease)   . Arthritis   . Gout   . Sinus congestion   . GERD (gastroesophageal reflux disease)   . Hypertension   . Hyperlipidemia   . Neuropathy (HCC)     In both legs, below knee  . Depression   . Anxiety disorder   . Cancer [facility])     Prostate  . Gait disorder 03/22/2013  . Peripheral neuropathy (HCC)     Probable  . Coronary artery disease   . Foot drop, bilateral 03/22/2013   Past Surgical History  Procedure Laterality Date  . Cardiac catheterization  09/24/2004    EF 60%  . Coronary artery bypass graft  2004  . US echocardiography  01/19/2008    EF 55-60%  . Cardiovascular stress test  07/17/2010    EF 61%  . Radium seed implant      Prostate cancer  . Tonsillectomy    . Hemorrhoid surgery    . Cataract extraction, bilateral     Family History  Problem Relation Age of Onset  . Alzheimer's disease Mother 77  . Coronary artery disease Father   . Prostate cancer Father    Social History  Substance Use Topics  . Smoking status: Former Smoker    Quit date: 08/01/1991  . Smokeless tobacco: Never Used  . Alcohol Use: No    Review of Systems  Constitutional: Negative.   HENT: Negative.   Respiratory: Positive for shortness of breath.   Cardiovascular: Negative.   Gastrointestinal: Negative.   Musculoskeletal: Negative.   Skin:  Negative.   Neurological: Negative.   Psychiatric/Behavioral: Negative.   All other systems reviewed and are negative.     Allergies  Lyrica; Statins; and Ibuprofen  Home Medications   Prior to Admission medications   Medication Sig Start Date End Date Taking? Authorizing Provider  allopurinol (ZYLOPRIM) 100 MG tablet Take 1 tablet (100 mg total) by mouth 2 (two) times daily. 02/21/13   Darlin Coco, MD  ALPRAZolam Duanne Moron) 0.5 MG tablet Take 0.5 mg by mouth 3 (three) times daily as needed for anxiety (anxiety).    Historical Provider, MD  aspirin 81 MG EC tablet Take 81 mg by mouth daily.      Historical Provider, MD  B Complex-C (SUPER B COMPLEX PO) Take 1 tablet by mouth daily.    Historical Provider, MD  cholecalciferol (VITAMIN D) 1000 UNITS tablet Take 1,000 Units by mouth daily.    Historical Provider, MD  clopidogrel (PLAVIX) 75 MG tablet Take 75 mg by mouth at bedtime.     Historical Provider, MD  furosemide (LASIX) 20 MG tablet TAKE 1 TABLET BY MOUTH DAILY 09/21/15   Darlin Coco, MD  HYDROcodone-acetaminophen (NORCO/VICODIN) 5-325 MG per tablet Take 5-325 tablets by mouth every 6 (six) hours as needed (pain).  09/20/13   Historical Provider, MD  lovastatin (MEVACOR) 10 MG tablet Take 10 mg by mouth  daily. 09/06/13   Historical Provider, MD  Misc Natural Products (OSTEO BI-FLEX JOINT SHIELD PO) Take 1 tablet by mouth 2 (two) times daily.     Historical Provider, MD  nitroGLYCERIN (NITROSTAT) 0.4 MG SL tablet Place 0.4 mg under the tongue every 5 (five) minutes as needed. For chest pain    Darlin Coco, MD  omega-3 acid ethyl esters (LOVAZA) 1 G capsule Take 1 g by mouth 3 (three) times daily.    Historical Provider, MD  omeprazole (PRILOSEC) 20 MG capsule Take 20 mg by mouth 2 (two) times daily.    Historical Provider, MD  potassium chloride SA (K-DUR,KLOR-CON) 20 MEQ tablet TAKE 1 TABLET BY MOUTH DAILY 11/06/15   Darlin Coco, MD  Red Yeast Rice 600 MG CAPS Take 600  mg by mouth 2 (two) times daily.    Historical Provider, MD  valsartan-hydrochlorothiazide (DIOVAN HCT) 160-12.5 MG per tablet Take 1 tablet by mouth daily. 12/16/13   Darlin Coco, MD   BP 118/60 mmHg  Pulse 79  Temp(Src) 97.5 F (36.4 C) (Oral)  Resp 24  SpO2 82% Physical Exam  Constitutional: He appears well-developed and well-nourished.  HENT:  Head: Normocephalic and atraumatic.  Eyes: Conjunctivae are normal. Pupils are equal, round, and reactive to light.  Neck: Neck supple. No tracheal deviation present. No thyromegaly present.  Cardiovascular: Normal rate and regular rhythm.   No murmur heard. Pulmonary/Chest: Effort normal and breath sounds normal.  Abdominal: Soft. Bowel sounds are normal. He exhibits no distension. There is no tenderness.  Musculoskeletal: Normal range of motion. He exhibits no edema or tenderness.  Neurological: He is alert. Coordination normal.  Skin: Skin is warm and dry. No rash noted.  Psychiatric: He has a normal mood and affect.  Nursing note and vitals reviewed.   ED Course  Procedures (including critical care time) Labs Review Labs Reviewed  BASIC METABOLIC PANEL  CBC WITH DIFFERENTIAL/PLATELET  BRAIN NATRIURETIC PEPTIDE  TROPONIN I    Imaging Review No results found. I have personally reviewed and evaluated these images and lab results as part of my medical decision-making.   EKG Interpretation   Date/Time:  Thursday November 08 2015 17:03:47 EST Ventricular Rate:  60 PR Interval:  196 QRS Duration: 108 QT Interval:  471 QTC Calculation: 471 R Axis:   24 Text Interpretation:  Sinus rhythm Low voltage, precordial leads  Nonspecific T abnormalities, anterior leads Confirmed by Winfred Leeds  MD,  Trexton Escamilla ([id number]) on 11/08/2015 5:56:05 PM     9:50 PM patient resting comfortably. Chest x-ray viewed by me. Results for orders placed or performed during the hospital encounter of 10/22/84  Basic metabolic panel  Result Value Ref  Range   Sodium 138 135 - 145 mmol/L   Potassium 4.7 3.5 - 5.1 mmol/L   Chloride 102 101 - 111 mmol/L   CO2 25 22 - 32 mmol/L   Glucose, Bld 102 (H) 65 - 99 mg/dL   BUN 25 (H) 6 - 20 mg/dL   Creatinine, Ser 1.35 (H) 0.61 - 1.24 mg/dL   Calcium 9.3 8.9 - 10.3 mg/dL   GFR calc non Af Amer 49 (L) >60 mL/min   GFR calc Af Amer 56 (L) >60 mL/min   Anion gap 11 5 - 15  CBC with Differential  Result Value Ref Range   WBC 6.6 4.0 - 10.5 K/uL   RBC 4.36 4.22 - 5.81 MIL/uL   Hemoglobin 13.7 13.0 - 17.0 g/dL   HCT 41.2 39.0 - 52.0 %  MCV 94.5 78.0 - 100.0 fL   MCH 31.4 26.0 - 34.0 pg   MCHC 33.3 30.0 - 36.0 g/dL   RDW 14.4 11.5 - 15.5 %   Platelets 185 150 - 400 K/uL   Neutrophils Relative % 74 %   Neutro Abs 4.9 1.7 - 7.7 K/uL   Lymphocytes Relative 19 %   Lymphs Abs 1.3 0.7 - 4.0 K/uL   Monocytes Relative 5 %   Monocytes Absolute 0.3 0.1 - 1.0 K/uL   Eosinophils Relative 2 %   Eosinophils Absolute 0.1 0.0 - 0.7 K/uL   Basophils Relative 0 %   Basophils Absolute 0.0 0.0 - 0.1 K/uL  Brain natriuretic peptide  Result Value Ref Range   B Natriuretic Peptide 125.4 (H) 0.0 - 100.0 pg/mL  Troponin I  Result Value Ref Range   Troponin I 0.16 (H) <0.031 ng/mL  D-dimer, quantitative (not at [facility])  Result Value Ref Range   D-Dimer, Quant 0.66 (H) 0.00 - 0.48 ug/mL-FEU   Dg Chest 2 View  11/08/2015  CLINICAL DATA:  Shortness of breath for 2 months. EXAM: CHEST  2 VIEW COMPARISON:  05/14/2015; 01/29/2015; 10/01/2009 FINDINGS: Grossly unchanged enlarged cardiac silhouette and mediastinal contours post median sternotomy. There is persistent prominence the central pulmonary vasculature, stable since the 2010 examination. Mild pulmonary venous congestion without frank evidence of edema. Basilar and peripheral predominant slightly nodular interstitial thickening is unchanged. No new focal airspace opacities. No definite pleural effusion, though note, the bilateral costophrenic angles are excluded  on the provided lateral radiograph. No acute osseus abnormalities. IMPRESSION: Similar findings of cardiomegaly, pulmonary venous congestion and chronic bronchitic change without definite superimposed acute cardiopulmonary disease. Electronically Signed   By: Sandi Mariscal M.D.   On: 11/08/2015 17:33   Ct Angio Chest Pe W/cm &/or Wo Cm  11/08/2015  CLINICAL DATA:  Shortness of breath over 2 days.  Elevated D-dimer. EXAM: CT ANGIOGRAPHY CHEST WITH CONTRAST TECHNIQUE: Multidetector CT imaging of the chest was performed using the standard protocol during bolus administration of intravenous contrast. Multiplanar CT image reconstructions and MIPs were obtained to evaluate the vascular anatomy. CONTRAST:  15m OMNIPAQUE IOHEXOL 350 MG/ML SOLN COMPARISON:  Chest radiograph earlier this date. FINDINGS: There are no filling defects within the pulmonary arteries to suggest pulmonary embolus. The heart is normal in size. Patient is post CABG with calcifications of the native coronaries. Tortuous thoracic aorta with atherosclerosis, no aneurysm. No periaortic soft tissue stranding. Multiple small mediastinal lymph nodes, not enlarged by size criteria. No pleural or pericardial effusion. Heterogeneous attenuation of the lung parenchyma. There is subpleural reticulation and scattered ground-glass opacities. Mild central bronchial thickening. No definite bronchiectasis. No confluent pneumonia. No pulmonary mass or suspicious nodule. With moderate hiatal hernia. No acute abnormality in the upper abdomen. Pericatheter lesion adjacent to the inferior right lobe of the liver measures 2 cm and is unchanged from prior abdominal MRI of 12/04/2009 and considered benign. There are no acute or suspicious osseous abnormalities. Peripherally sclerotic lesion in the right proximal humerus measuring 1.8 cm transverse dimension, only partially included. No aggressive characteristics. Review of the MIP images confirms the above findings.  IMPRESSION: 1. No pulmonary embolus. 2. Chronic lung findings that may reflect interstitial lung disease. Further characterization could be considered on a nonemergent basis with high-resolution chest CT based on clinical concern. 3. Peripherally sclerotic lesion in the right proximal humerus is only partially included, however has no suspicious characteristics. Given history of prostate cancer, bone scan could be  considered to exclude metastatic disease. No abnormality ws noted on previous bone scan from 2010 in this region. 4. Moderate hiatal hernia. Electronically Signed   By: Jeb Levering M.D.   On: 11/08/2015 20:47    MDM  I spoke with Dr. Tommi Rumps from cardiology service who will consult on case and evaluate patient. I feel the patient likely has demand ischemia from hypoxia rather and interstitial lung disease rather than acute coronary syndrome. I consult with Dr.Khan will make arrangements for inpatient stay Final diagnoses:  None   Dx #1 Dyspnea #2 hypoxia #3 renal insufficiency     Orlie Dakin, MD 11/08/15 2156

## 2015-11-08 NOTE — ED Notes (Signed)
Per EMS- Pt went to PCP for evaluation of ongoing shortness of breath for two months that has been worse the past two months. Pt was noted to by 85% when ambulating.

## 2015-11-08 NOTE — ED Notes (Signed)
Patient transported to X-ray

## 2015-11-08 NOTE — Consult Note (Signed)
CARDIOLOGY CONSULT NOTE   Patient ID: Steven Cameron MRN: [id number], DOB/AGE: Mar 12, 1935   Admit date: 11/08/2015 Date of Consult: 11/08/2015   Primary Physician: Imagene Riches, NP Primary Cardiologist: Dr. Mare Ferrari   Pt. Profile  21M with HTN, gout, HLD, periperhal neuropathy with foot drop and CAD s/p CABG (2004) who presents with hypoxemia and NSTEMI.   Problem List  Past Medical History  Diagnosis Date  . IHD (ischemic heart disease)   . Arthritis   . Gout   . Sinus congestion   . GERD (gastroesophageal reflux disease)   . Hypertension   . Hyperlipidemia   . Neuropathy (HCC)     In both legs, below knee  . Depression   . Anxiety disorder   . Cancer [facility])     Prostate  . Gait disorder 03/22/2013  . Peripheral neuropathy (HCC)     Probable  . Coronary artery disease   . Foot drop, bilateral 03/22/2013    Past Surgical History  Procedure Laterality Date  . Cardiac catheterization  09/24/2004    EF 60%  . Coronary artery bypass graft  2004  . US echocardiography  01/19/2008    EF 55-60%  . Cardiovascular stress test  07/17/2010    EF 61%  . Radium seed implant      Prostate cancer  . Tonsillectomy    . Hemorrhoid surgery    . Cataract extraction, bilateral       Allergies  Allergies  Allergen Reactions  . Lyrica [Pregabalin]     Gait instability  . Statins Other (See Comments)    Weakness of legs  . Ibuprofen Rash    HPI   21M with HTN, gout, HLD, periperhal neuropathy with foot drop and CAD s/p CABG (2004) who presents with hypoxemia and NSTEMI.   He reports progressive dyspnea and dizziness over the past week or so. (He told other providers dyspnea has been going on 1-2 months). No cough, fevers, chills, CP, LE edema, PND, or orthopnea. He says his son checked a BP today and found a reading of 108/51 and suggested he get seen by a PCP. The PCP found him to have oxygen saturations in the mid 80s on ambulation and referred him to the ER.   On  arrival to the ER he was hypoxemic to 82% on RA.97.5, HR 79, RR 24, BP 118/60. Saturations improved to 93% on 2L [place]. ECG demonstrated NSR. NSSTTWC. Compared to 10/04/15, NSSTTWC are now present. Labs were notable for K 4.7, Cr 1.35, BNP 125, TnI 0.16, d dimer 0.66. CXR demonstrated cardiomegaly, pulmonary venous congestion and chronic bronchitic change without definite superimposed acute cardiopulmonary disease. A CT angiogram demonstrated chronic lung findings c/w ILD, no PE, and a sclerotic lesion in R humerous (bone scan suggested to r/o metastatic disease in setting of h/o prostate CA).    He does not carry a formal diagnosis of any lung disorder. He was a heavy smoker for ~ 20 years.  He reports his aunt and mother died of pulmonary fibrosis and he was asked to be in a study looking at the genetics of pulmonary fibrosis but ultimately declined.  Inpatient Medications   Family History Family History  Problem Relation Age of Onset  . Alzheimer's disease Mother 78  . Coronary artery disease Father   . Prostate cancer Father      Social History Social History   Social History  . Marital Status: Married    Spouse Name: N/A  .  Number of Children: 2  . Years of Education: 14   Occupational History  .      retired   Social History Main Topics  . Smoking status: Former Smoker    Quit date: 08/01/1991  . Smokeless tobacco: Never Used  . Alcohol Use: No  . Drug Use: No  . Sexual Activity: Not on file   Other Topics Concern  . Not on file   Social History Narrative   Patient lives at home with his wife Steven Cameron).   Retired.    Education some college.Right handed.   Caffeine coffee four cups .         Review of Systems  General:  No chills, fever, night sweats or weight changes.  Cardiovascular:  No chest pain, edema, orthopnea, palpitations, paroxysmal nocturnal dyspnea. + DOE Dermatological: No rash, lesions/masses Respiratory: No cough, dyspnea Urologic: No hematuria,  dysuria Abdominal:   No nausea, vomiting, diarrhea, bright red blood per rectum, melena, or hematemesis Neurologic:  No visual changes, wkns, changes in mental status. All other systems reviewed and are otherwise negative except as noted above.  Physical Exam  Blood pressure 117/65, pulse 71, temperature 97.5 F (36.4 C), temperature source Oral, resp. rate 19, SpO2 93 %.  General: Pleasant, NAD Psych: Normal affect. Neuro: Alert and oriented X 3. Moves all extremities spontaneously. HEENT: Normal  Neck: Supple without bruits or JVD. Lungs:  Resp regular and unlabored, breathing comfortably on [place]. Bibasilar crackles 1/3 way up bilaterally.  Heart: RRR no s3, s4, or murmurs. Abdomen: Soft, non-tender, non-distended, BS + x 4.  Extremities: No clubbing, cyanosis or edema. DP/PT/Radials 2+ and equal bilaterally.  Labs   Recent Labs  11/08/15 1715  TROPONINI 0.16*   Lab Results  Component Value Date   WBC 6.6 11/08/2015   HGB 13.7 11/08/2015   HCT 41.2 11/08/2015   MCV 94.5 11/08/2015   PLT 185 11/08/2015    Recent Labs Lab 11/08/15 1714  NA 138  K 4.7  CL 102  CO2 25  BUN 25*  CREATININE 1.35*  CALCIUM 9.3  GLUCOSE 102*   Lab Results  Component Value Date   CHOL 148 10/04/2015   HDL 35* 10/04/2015   LDLCALC 89 10/04/2015   TRIG 119 10/04/2015   Lab Results  Component Value Date   DDIMER 0.66* 11/08/2015    Radiology/Studies  Dg Chest 2 View  11/08/2015  CLINICAL DATA:  Shortness of breath for 2 months. EXAM: CHEST  2 VIEW COMPARISON:  05/14/2015; 01/29/2015; 10/01/2009 FINDINGS: Grossly unchanged enlarged cardiac silhouette and mediastinal contours post median sternotomy. There is persistent prominence the central pulmonary vasculature, stable since the 2010 examination. Mild pulmonary venous congestion without frank evidence of edema. Basilar and peripheral predominant slightly nodular interstitial thickening is unchanged. No new focal airspace opacities.  No definite pleural effusion, though note, the bilateral costophrenic angles are excluded on the provided lateral radiograph. No acute osseus abnormalities. IMPRESSION: Similar findings of cardiomegaly, pulmonary venous congestion and chronic bronchitic change without definite superimposed acute cardiopulmonary disease. Electronically Signed   By: Sandi Mariscal M.D.   On: 11/08/2015 17:33   Ct Angio Chest Pe W/cm &/or Wo Cm  11/08/2015  CLINICAL DATA:  Shortness of breath over 2 days.  Elevated D-dimer. EXAM: CT ANGIOGRAPHY CHEST WITH CONTRAST TECHNIQUE: Multidetector CT imaging of the chest was performed using the standard protocol during bolus administration of intravenous contrast. Multiplanar CT image reconstructions and MIPs were obtained to evaluate the vascular anatomy. CONTRAST:  143m OMNIPAQUE IOHEXOL 350 MG/ML SOLN COMPARISON:  Chest radiograph earlier this date. FINDINGS: There are no filling defects within the pulmonary arteries to suggest pulmonary embolus. The heart is normal in size. Patient is post CABG with calcifications of the native coronaries. Tortuous thoracic aorta with atherosclerosis, no aneurysm. No periaortic soft tissue stranding. Multiple small mediastinal lymph nodes, not enlarged by size criteria. No pleural or pericardial effusion. Heterogeneous attenuation of the lung parenchyma. There is subpleural reticulation and scattered ground-glass opacities. Mild central bronchial thickening. No definite bronchiectasis. No confluent pneumonia. No pulmonary mass or suspicious nodule. With moderate hiatal hernia. No acute abnormality in the upper abdomen. Pericatheter lesion adjacent to the inferior right lobe of the liver measures 2 cm and is unchanged from prior abdominal MRI of 12/04/2009 and considered benign. There are no acute or suspicious osseous abnormalities. Peripherally sclerotic lesion in the right proximal humerus measuring 1.8 cm transverse dimension, only partially included.  No aggressive characteristics. Review of the MIP images confirms the above findings. IMPRESSION: 1. No pulmonary embolus. 2. Chronic lung findings that may reflect interstitial lung disease. Further characterization could be considered on a nonemergent basis with high-resolution chest CT based on clinical concern. 3. Peripherally sclerotic lesion in the right proximal humerus is only partially included, however has no suspicious characteristics. Given history of prostate cancer, bone scan could be considered to exclude metastatic disease. No abnormality ws noted on previous bone scan from 2010 in this region. 4. Moderate hiatal hernia. Electronically Signed   By: MJeb LeveringM.D.   On: 11/08/2015 20:47   03/27/15 TTE - Left ventricle: The cavity size was normal. There was mild focal basal hypertrophy of the septum. Systolic function was normal. The estimated ejection fraction was in the range of 60% to 65%. Wall motion was normal; there were no regional wall motion abnormalities. Features are consistent with a pseudonormal left ventricular filling pattern, with concomitant abnormal relaxation and increased filling pressure (grade 2 diastolic dysfunction). - Mitral valve: There was mild regurgitation. - Left atrium: The atrium was moderately dilated. - Pulmonary arteries: Systolic pressure was mildly increased. PA peak pressure: 41 mm Hg (S).  ECG  08-Nov-2015 17:03:47: NSR. NSSTTWC. Compared to 10/04/15, NSSTTWC are now present.   ASSESSMENT AND PLAN  15M with HTN, gout, HLD, periperhal neuropathy with foot drop and CAD s/p CABG (2004) who presents with hypoxemia and NSTEMI. ECG demonstrates mild STTWC and troponin is elevated - both likely related to prolonged hypoxemia in the setting of know CAD. Based on family history, presentation, and imaging, this likely reflects developing ILD. It is unlikely that the elevated troponin reflects acute plaque rupture. Unless there is a  significant troponin elevation or wall motion abnormality on TTE, do not anticipate Mr. ODrudgewill need a cath.   1. Cycle troponins 2. ASA 882mand plavix 7516maily (an outpatient med)  3. Lovastatin 74m57mily (has had issues with other statins) 4. Start 12.5mg 98moprolol BID 5. TTE to assess LV function 6. Continue valsartan/HCTZ 7. ILD w/u and management per internal medicine  Signed, FRIEDLamar Sprinkles11/09/2015, 9:33 PM

## 2015-11-08 NOTE — H&P (Signed)
[facility] History and Physical  LINK BURGESON ZOX:[id number] DOB: [birth year] DOA: 11/08/2015  Referring physician: Orlie Dakin, MD PCP: Imagene Riches, NP   Chief Complaint: Shortness of Breath  HPI: Steven Cameron is a 80 y.o. male with history of CAD GERD HTN HLD presents with shortness of breath and noted elevated troponin. Patient states that he has had SOB for a while but over the last couple of days he has noted that there is increased difficulty getting around because of the shortness of breath. He has not had any chest pain., He states he has not had a cough. He has noted some dizziness. He has been feeling a little light headed. No syncope. He has chronic neuropathy from taking colchicine for treatment of gout. He states he is now on allopurinol. 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 Also worked in [employer]. He also has a history of working with asbestos years ago. He did not serve in active duty. Patient states that he has been a smoker. He quit 27 years ago.   Review of Systems:  Constitutional:  No weight loss, Fevers, chills, fatigue.  HEENT:  No headaches, itching, ear ache, nasal congestion, +post nasal drip,  Cardio-vascular:  No chest pain, PND, swelling in lower extremities  GI:  No heartburn, indigestion, abdominal pain, nausea, vomiting, diarrhea  Resp:  +shortness of breath with exertion and at rest. No coughing up of blood Skin:  no rash or lesions.  GU:  no dysuria, change in color of urine Musculoskeletal:  No joint pain or swelling Psych:  No change in mood or affect  Past Medical History  Diagnosis Date  . IHD (ischemic heart disease)   . Arthritis   . Gout   . Sinus congestion   . GERD (gastroesophageal reflux disease)   . Hypertension   . Hyperlipidemia   . Neuropathy (HCC)     In both legs, below knee  . Depression   . Anxiety disorder   . Cancer [facility])     Prostate  . Gait disorder 03/22/2013  . Peripheral  neuropathy (HCC)     Probable  . Coronary artery disease   . Foot drop, bilateral 03/22/2013   Past Surgical History  Procedure Laterality Date  . Cardiac catheterization  09/24/2004    EF 60%  . Coronary artery bypass graft  2004  . US echocardiography  01/19/2008    EF 55-60%  . Cardiovascular stress test  07/17/2010    EF 61%  . Radium seed implant      Prostate cancer  . Tonsillectomy    . Hemorrhoid surgery    . Cataract extraction, bilateral     Social History:  reports that he quit smoking about 24 years ago. He has never used smokeless tobacco. He reports that he does not drink alcohol or use illicit drugs.  Allergies  Allergen Reactions  . Lyrica [Pregabalin]     Gait instability  . Statins Other (See Comments)    Weakness of legs  . Ibuprofen Rash    Family History  Problem Relation Age of Onset  . Alzheimer's disease Mother 2  . Coronary artery disease Father   . Prostate cancer Father      Prior to Admission medications   Medication Sig Start Date End Date Taking? Authorizing Provider  allopurinol (ZYLOPRIM) 100 MG tablet Take 1 tablet (100 mg total) by mouth 2 (two) times daily. 02/21/13  Yes Darlin Coco, MD  ALPRAZolam Duanne Moron) 0.5  MG tablet Take 0.5 mg by mouth 3 (three) times daily as needed for anxiety (anxiety).   Yes Historical Provider, MD  aspirin 81 MG EC tablet Take 81 mg by mouth daily.     Yes Historical Provider, MD  B Complex-C (SUPER B COMPLEX PO) Take 1 tablet by mouth daily.   Yes Historical Provider, MD  cholecalciferol (VITAMIN D) 1000 UNITS tablet Take 1,000 Units by mouth daily.   Yes Historical Provider, MD  clopidogrel (PLAVIX) 75 MG tablet Take 75 mg by mouth at bedtime.    Yes Historical Provider, MD  furosemide (LASIX) 20 MG tablet TAKE 1 TABLET BY MOUTH DAILY 09/21/15  Yes Darlin Coco, MD  HYDROcodone-acetaminophen (NORCO/VICODIN) 5-325 MG per tablet Take 5-325 tablets by mouth every 6 (six) hours as needed (pain).  09/20/13   Yes Historical Provider, MD  lovastatin (MEVACOR) 10 MG tablet Take 10 mg by mouth daily. 09/06/13  Yes Historical Provider, MD  Misc Natural Products (OSTEO BI-FLEX JOINT SHIELD PO) Take 1 tablet by mouth 2 (two) times daily.    Yes Historical Provider, MD  nitroGLYCERIN (NITROSTAT) 0.4 MG SL tablet Place 0.4 mg under the tongue every 5 (five) minutes as needed. For chest pain   Yes Darlin Coco, MD  omega-3 acid ethyl esters (LOVAZA) 1 G capsule Take 1 g by mouth 3 (three) times daily.   Yes Historical Provider, MD  omeprazole (PRILOSEC) 20 MG capsule Take 20 mg by mouth 2 (two) times daily.   Yes Historical Provider, MD  potassium chloride SA (K-DUR,KLOR-CON) 20 MEQ tablet TAKE 1 TABLET BY MOUTH DAILY 11/06/15  Yes Darlin Coco, MD  Red Yeast Rice 600 MG CAPS Take 600 mg by mouth 2 (two) times daily.   Yes Historical Provider, MD  valsartan-hydrochlorothiazide (DIOVAN HCT) 160-12.5 MG per tablet Take 1 tablet by mouth daily. 12/16/13  Yes Darlin Coco, MD   Physical Exam: Filed Vitals:   11/08/15 1956 11/08/15 2000 11/08/15 2032 11/08/15 2122  BP: 122/70 128/63 129/68 117/65  Pulse: 68 64 64 71  Temp:      TempSrc:      Resp: _0 SpO2: 94% 90% 93% 93%    Wt Readings from Last 3 Encounters:  10/04/15 88.052 kg (194 lb 1.9 oz)  09/24/15 87.998 kg (194 lb)  03/21/15 93.35 kg (205 lb 12.8 oz)    General:  Appears calm and comfortable Eyes: PERRL, normal lids, irises & conjunctiva ENT: grossly normal hearing, lips & tongue Neck: no LAD, masses or thyromegaly Cardiovascular: RRR, no m/r/g. No LE edema Respiratory: CTA bilaterally, no w/r/r. Normal respiratory effort. Abdomen: soft, ntnd Skin: no rash or induration seen on limited exam Musculoskeletal: grossly normal tone BUE/BLE Psychiatric: grossly normal mood and affect Neurologic: grossly non-focal.          Labs on Admission:  Basic Metabolic Panel:  Recent Labs Lab 11/08/15 1714  NA 138  K 4.7  CL  102  CO2 25  GLUCOSE 102*  BUN 25*  CREATININE 1.35*  CALCIUM 9.3   Liver Function Tests: No results for input(s): AST, ALT, ALKPHOS, BILITOT, PROT, ALBUMIN in the last 168 hours. No results for input(s): LIPASE, AMYLASE in the last 168 hours. No results for input(s): AMMONIA in the last 168 hours. CBC:  Recent Labs Lab 11/08/15 1714  WBC 6.6  NEUTROABS 4.9  HGB 13.7  HCT 41.2  MCV 94.5  PLT 185   Cardiac Enzymes:  Recent Labs Lab 11/08/15 1715  TROPONINI 0.16*    BNP (last 3 results)  Recent Labs  11/08/15 1714  BNP 125.4*    ProBNP (last 3 results) No results for input(s): PROBNP in the last 8760 hours.  CBG: No results for input(s): GLUCAP in the last 168 hours.  Radiological Exams on Admission: Dg Chest 2 View  11/08/2015  CLINICAL DATA:  Shortness of breath for 2 months. EXAM: CHEST  2 VIEW COMPARISON:  05/14/2015; 01/29/2015; 10/01/2009 FINDINGS: Grossly unchanged enlarged cardiac silhouette and mediastinal contours post median sternotomy. There is persistent prominence the central pulmonary vasculature, stable since the 2010 examination. Mild pulmonary venous congestion without frank evidence of edema. Basilar and peripheral predominant slightly nodular interstitial thickening is unchanged. No new focal airspace opacities. No definite pleural effusion, though note, the bilateral costophrenic angles are excluded on the provided lateral radiograph. No acute osseus abnormalities. IMPRESSION: Similar findings of cardiomegaly, pulmonary venous congestion and chronic bronchitic change without definite superimposed acute cardiopulmonary disease. Electronically Signed   By: Sandi Mariscal M.D.   On: 11/08/2015 17:33   Ct Angio Chest Pe W/cm &/or Wo Cm  11/08/2015  CLINICAL DATA:  Shortness of breath over 2 days.  Elevated D-dimer. EXAM: CT ANGIOGRAPHY CHEST WITH CONTRAST TECHNIQUE: Multidetector CT imaging of the chest was performed using the standard protocol during  bolus administration of intravenous contrast. Multiplanar CT image reconstructions and MIPs were obtained to evaluate the vascular anatomy. CONTRAST:  146m OMNIPAQUE IOHEXOL 350 MG/ML SOLN COMPARISON:  Chest radiograph earlier this date. FINDINGS: There are no filling defects within the pulmonary arteries to suggest pulmonary embolus. The heart is normal in size. Patient is post CABG with calcifications of the native coronaries. Tortuous thoracic aorta with atherosclerosis, no aneurysm. No periaortic soft tissue stranding. Multiple small mediastinal lymph nodes, not enlarged by size criteria. No pleural or pericardial effusion. Heterogeneous attenuation of the lung parenchyma. There is subpleural reticulation and scattered ground-glass opacities. Mild central bronchial thickening. No definite bronchiectasis. No confluent pneumonia. No pulmonary mass or suspicious nodule. With moderate hiatal hernia. No acute abnormality in the upper abdomen. Pericatheter lesion adjacent to the inferior right lobe of the liver measures 2 cm and is unchanged from prior abdominal MRI of 12/04/2009 and considered benign. There are no acute or suspicious osseous abnormalities. Peripherally sclerotic lesion in the right proximal humerus measuring 1.8 cm transverse dimension, only partially included. No aggressive characteristics. Review of the MIP images confirms the above findings. IMPRESSION: 1. No pulmonary embolus. 2. Chronic lung findings that may reflect interstitial lung disease. Further characterization could be considered on a nonemergent basis with high-resolution chest CT based on clinical concern. 3. Peripherally sclerotic lesion in the right proximal humerus is only partially included, however has no suspicious characteristics. Given history of prostate cancer, bone scan could be considered to exclude metastatic disease. No abnormality ws noted on previous bone scan from 2010 in this region. 4. Moderate hiatal hernia.  Electronically Signed   By: MJeb LeveringM.D.   On: 11/08/2015 20:47      Assessment/Plan Active Problems:   Dyslipidemia   [place]   ILD (interstitial lung disease) (HCC)   Shortness of breath   Elevated troponin   Hypoxia    1. Elevated troponin/NSTEMI -likely demand ischemia -will check serial enzymes  2. Shortness of Breath -secondary to ILD -will continue with oxygen therapy  3. Interstitial lung disease -needs further work up as an outpatient perhaps -CT scan suggestive of chronic interstitial lung disease  4. Hyperlipidemia -will continue with pravachol  5. AKI-baseline creatinine around 1.2 -will need to hold lasix overnight -start on IVF -Repeat labs in am  6. Hypertension -will continue with losartan -monitor pressures     Code Status: full code (must indicate code status--if unknown or must be presumed, indicate so) DVT Prophylaxis:on plavix Family Communication: none (indicate person spoken with, if applicable, with phone number if by telephone) Disposition Plan: home (indicate anticipated LOS)    [facility] Pager [phone]

## 2015-11-08 NOTE — Progress Notes (Signed)
Pt arrived to the floor and tele applied. RN oriented pt to room and unit. Pt was without pain and requested to be allowed to sleep.   Will continue to monitor.   Steven Cameron

## 2015-11-09 ENCOUNTER — Ambulatory Visit (HOSPITAL_COMMUNITY): Payer: Medicare Other

## 2015-11-09 ENCOUNTER — Inpatient Hospital Stay (HOSPITAL_COMMUNITY): Payer: Medicare Other

## 2015-11-09 DIAGNOSIS — J849 Interstitial pulmonary disease, unspecified: Secondary | ICD-10-CM

## 2015-11-09 DIAGNOSIS — I272 Other secondary pulmonary hypertension: Secondary | ICD-10-CM

## 2015-11-09 DIAGNOSIS — R0602 Shortness of breath: Secondary | ICD-10-CM

## 2015-11-09 LAB — TROPONIN I
TROPONIN I: 0.11 ng/mL — AB (ref <0.031)
TROPONIN I: 0.12 ng/mL — AB (ref <0.031)
TROPONIN I: 0.12 ng/mL — AB (ref <0.031)

## 2015-11-09 MED ORDER — HYDROCODONE-ACETAMINOPHEN 5-325 MG PO TABS
1.0000 | ORAL_TABLET | Freq: Four times a day (QID) | ORAL | Status: DC | PRN
  Administered 2015-11-09 (×3): 1 via ORAL
  Filled 2015-11-09 (×3): qty 1

## 2015-11-09 MED ORDER — ENOXAPARIN SODIUM 40 MG/0.4ML ~~LOC~~ SOLN
40.0000 mg | SUBCUTANEOUS | Status: DC
  Administered 2015-11-09 – 2015-11-10 (×2): 40 mg via SUBCUTANEOUS
  Filled 2015-11-09 (×2): qty 0.4

## 2015-11-09 NOTE — Care Management Note (Signed)
Case Management Note  Patient Details  Name: Steven Cameron MRN: [id number] Date of Birth: Aug 17, 1935  Subjective/Objective:        Shortness of breath      Action/Plan: Lives at home with wife. Pt will dc tomorrow with oxygen for home. Contacted [facility] for oxygen for scheduled dc 11/10/2015.  Expected Discharge Date:  11/12/30/2014              Expected Discharge Plan:  Home/Self Care  In-House Referral:     Discharge planning Services  CM Consult  Post Acute Care Choice:    Choice offered to:     DME Arranged:  Oxygen DME Agency:  [place].   Status of Service:  Completed, signed off  Medicare Important Message Given:    Date Medicare IM Given:    Medicare IM give by:    Date Additional Medicare IM Given:    Additional Medicare Important Message give by:     If discussed at [place] of Stay Meetings, dates discussed:    Additional Comments:  Erenest Rasher, RN 11/09/2015, 5:33 PM

## 2015-11-09 NOTE — Progress Notes (Signed)
Utilization review completed. Amyra Vantuyl, RN, BSN. 

## 2015-11-09 NOTE — Consult Note (Signed)
Name: Steven Cameron MRN: [id number] DOB: [birth year]    ADMISSION DATE:  11/08/2015 CONSULTATION DATE:  11/09/15  REFERRING MD : Emeline Gins Elgergawy  CHIEF COMPLAINT:  Dyspnea, Hypoxemia  BRIEF PATIENT DESCRIPTION: [name] is a 80 year old with history of hypertension, coronary artery disease, gout. Admitted with dyspnea, hypoxemia, and nSTEMI. Found to have pulmonary fibrosis on CT. PCCM asked to consult.  SIGNIFICANT EVENTS    STUDIES:  CT chest 11/10 >> no pulmonary embolus, pulmonary fibrosis.  HISTORY OF PRESENT ILLNESS:  [name] is a 80 year old with history of hypertension, coronary artery disease, gout. Admitted with dyspnea, hypoxemia, and nSTEMI. Found to have pulmonary fibrosis on CT. PCCM asked to consult.   He has history of gout and was on colchicine in the past. This was stopped secondary to neuropathy. He is currently on allopurinol. He also has history of osteoarthritis, diastolic congestive heart failure, coronary artery disease. He worked in various jobs initially in a Copy. 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 He may have had significant exposure to asbestos during this time. 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 He lives with his wife. He has a 75-pack-year history of smoking. Quit around 1995.  This is significant family history of pulmonary fibrosis on his mother's side. His mother, aunt and cousins all suffered from pulmonary fibrosis. He was told that his mother developed pulmonary fibrosis from medication for arthritis. He does not recall what this medication is. His family was enrolled in a gene study for pulmonary fibrosis at [facility] about 5 years ago. He was told that his family had a susceptibility gene for primary fibrosis. He does not know if he carries a gene as well and he does not know what this gene is. There is history of prostate cancer on his father's  side.   He denies any other exposures except for asbestos. He denies any joint pain except for gout and osteoarthritis. He denies any signs and symptoms of connective tissue disease or autoimmune disease.    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  has past surgical history that includes Cardiac catheterization (09/24/2004); Coronary artery bypass graft (2004); US ECHOCARDIOGRAPHY (01/19/2008); Cardiovascular stress test (07/17/2010); radium seed implant; Tonsillectomy; Hemorrhoid surgery; and Cataract extraction, bilateral. Prior to Admission medications   Medication Sig Start Date End Date Taking? Authorizing Provider  allopurinol (ZYLOPRIM) 100 MG tablet Take 1 tablet (100 mg total) by mouth 2 (two) times daily. 02/21/13  Yes Darlin Coco, MD  ALPRAZolam Duanne Moron) 0.5 MG tablet Take 0.5 mg by mouth 3 (three) times daily as needed for anxiety (anxiety).   Yes Historical Provider, MD  aspirin 81 MG EC tablet Take 81 mg by mouth daily.     Yes Historical Provider, MD  B Complex-C (SUPER B COMPLEX PO) Take 1 tablet by mouth daily.   Yes Historical Provider, MD  cholecalciferol (VITAMIN D) 1000 UNITS tablet Take 1,000 Units by mouth daily.   Yes Historical Provider, MD  clopidogrel (PLAVIX) 75 MG tablet Take 75 mg by mouth at bedtime.    Yes Historical Provider, MD  furosemide (LASIX) 20 MG tablet TAKE 1 TABLET BY MOUTH DAILY 09/21/15  Yes  Darlin Coco, MD  HYDROcodone-acetaminophen (NORCO/VICODIN) 5-325 MG per tablet Take 5-325 tablets by mouth every 6 (six) hours as needed (pain).  09/20/13  Yes Historical Provider, MD  lovastatin (MEVACOR) 10 MG tablet Take 10 mg by mouth  daily. 09/06/13  Yes Historical Provider, MD  Misc Natural Products (OSTEO BI-FLEX JOINT SHIELD PO) Take 1 tablet by mouth 2 (two) times daily.    Yes Historical Provider, MD  nitroGLYCERIN (NITROSTAT) 0.4 MG SL tablet Place 0.4 mg under the tongue every 5 (five) minutes as needed. For chest pain   Yes Darlin Coco, MD  omega-3 acid ethyl esters (LOVAZA) 1 G capsule Take 1 g by mouth 3 (three) times daily.   Yes Historical Provider, MD  omeprazole (PRILOSEC) 20 MG capsule Take 20 mg by mouth 2 (two) times daily.   Yes Historical Provider, MD  potassium chloride SA (K-DUR,KLOR-CON) 20 MEQ tablet TAKE 1 TABLET BY MOUTH DAILY 11/06/15  Yes Darlin Coco, MD  Red Yeast Rice 600 MG CAPS Take 600 mg by mouth 2 (two) times daily.   Yes Historical Provider, MD  valsartan-hydrochlorothiazide (DIOVAN HCT) 160-12.5 MG per tablet Take 1 tablet by mouth daily. 12/16/13  Yes Darlin Coco, MD   Allergies  Allergen Reactions  . Lyrica [Pregabalin]     Gait instability  . Statins Other (See Comments)    Weakness of legs  . Ibuprofen Rash    FAMILY HISTORY:  family history includes Alzheimer's disease (age of onset: 45) in his mother; Coronary artery disease in his father; Prostate cancer in his father. SOCIAL HISTORY:  reports that he quit smoking about 24 years ago. He has never used smokeless tobacco. He reports that he does not drink alcohol or use illicit drugs.  REVIEW OF SYSTEMS:   Constitutional: Negative for fever, chills, weight loss, malaise/fatigue and diaphoresis.  HENT: Negative for hearing loss, ear pain, nosebleeds, congestion, sore throat, neck pain, tinnitus and ear discharge.   Eyes: Negative for blurred vision, double vision, photophobia, pain, discharge and redness.  Respiratory: Negative for cough, hemoptysis, sputum production, shortness of breath, wheezing and stridor.   Cardiovascular: Negative for chest pain, palpitations, orthopnea, claudication, leg swelling and PND.    Gastrointestinal: Negative for heartburn, nausea, vomiting, abdominal pain, diarrhea, constipation, blood in stool and melena.  Genitourinary: Negative for dysuria, urgency, frequency, hematuria and flank pain.  Musculoskeletal: Negative for myalgias, back pain, joint pain and falls.  Skin: Negative for itching and rash.  Neurological: Negative for dizziness, tingling, tremors, sensory change, speech change, focal weakness, seizures, loss of consciousness, weakness and headaches.  Endo/Heme/Allergies: Negative for environmental allergies and polydipsia. Does not bruise/bleed easily.  SUBJECTIVE:   VITAL SIGNS: Temp:  [97.5 F (36.4 C)-98.1 F (36.7 C)] 97.5 F (36.4 C) (11/11 1343) Pulse Rate:  [62-91] 65 (11/11 1343) Resp:  [14-24] 18 (11/11 1343) BP: (102-140)/(46-76) 123/59 mmHg (11/11 1343) SpO2:  [82 %-97 %] 97 % (11/11 1343) Weight:  [194 lb 3.6 oz (88.1 kg)] 194 lb 3.6 oz (88.1 kg) (11/10 2259)  PHYSICAL EXAMINATION: General: Very pleasant gentleman. Awake, oriented, no distress. Neuro:  Cranial nerves intact, no gross focal deficit. HEENT:  Moist mucous membranes, no JVD, lymphadenopathy. Cardiovascular:  Regular rate and rhythm, no murmurs rubs gallops Lungs:  Faint bibasilar crackles. No wheeze. Nonlabored breathing. Abdomen:  Soft, positive bowel sounds, nontender, nondistended. Skin:  Intact.   Recent Labs Lab 11/08/15 1714  NA 138  K 4.7  CL 102  CO2 25  BUN 25*  CREATININE 1.35*  GLUCOSE 102*    Recent Labs Lab 11/08/15 1714  HGB 13.7  HCT 41.2  WBC 6.6  PLT 185   Dg Chest 2 View  11/08/2015  CLINICAL DATA:  Shortness of breath for 2 months. EXAM: CHEST  2 VIEW COMPARISON:  05/14/2015; 01/29/2015; 10/01/2009 FINDINGS: Grossly unchanged enlarged cardiac silhouette and mediastinal contours post median sternotomy. There is persistent prominence the central pulmonary vasculature, stable since the 2010 examination. Mild pulmonary venous congestion  without frank evidence of edema. Basilar and peripheral predominant slightly nodular interstitial thickening is unchanged. No new focal airspace opacities. No definite pleural effusion, though note, the bilateral costophrenic angles are excluded on the provided lateral radiograph. No acute osseus abnormalities. IMPRESSION: Similar findings of cardiomegaly, pulmonary venous congestion and chronic bronchitic change without definite superimposed acute cardiopulmonary disease. Electronically Signed   By: Sandi Mariscal M.D.   On: 11/08/2015 17:33   Ct Angio Chest Pe W/cm &/or Wo Cm  11/08/2015  CLINICAL DATA:  Shortness of breath over 2 days.  Elevated D-dimer. EXAM: CT ANGIOGRAPHY CHEST WITH CONTRAST TECHNIQUE: Multidetector CT imaging of the chest was performed using the standard protocol during bolus administration of intravenous contrast. Multiplanar CT image reconstructions and MIPs were obtained to evaluate the vascular anatomy. CONTRAST:  186m OMNIPAQUE IOHEXOL 350 MG/ML SOLN COMPARISON:  Chest radiograph earlier this date. FINDINGS: There are no filling defects within the pulmonary arteries to suggest pulmonary embolus. The heart is normal in size. Patient is post CABG with calcifications of the native coronaries. Tortuous thoracic aorta with atherosclerosis, no aneurysm. No periaortic soft tissue stranding. Multiple small mediastinal lymph nodes, not enlarged by size criteria. No pleural or pericardial effusion. Heterogeneous attenuation of the lung parenchyma. There is subpleural reticulation and scattered ground-glass opacities. Mild central bronchial thickening. No definite bronchiectasis. No confluent pneumonia. No pulmonary mass or suspicious nodule. With moderate hiatal hernia. No acute abnormality in the upper abdomen. Pericatheter lesion adjacent to the inferior right lobe of the liver measures 2 cm and is unchanged from prior abdominal MRI of 12/04/2009 and considered benign. There are no acute or  suspicious osseous abnormalities. Peripherally sclerotic lesion in the right proximal humerus measuring 1.8 cm transverse dimension, only partially included. No aggressive characteristics. Review of the MIP images confirms the above findings. IMPRESSION: 1. No pulmonary embolus. 2. Chronic lung findings that may reflect interstitial lung disease. Further characterization could be considered on a nonemergent basis with high-resolution chest CT based on clinical concern. 3. Peripherally sclerotic lesion in the right proximal humerus is only partially included, however has no suspicious characteristics. Given history of prostate cancer, bone scan could be considered to exclude metastatic disease. No abnormality ws noted on previous bone scan from 2010 in this region. 4. Moderate hiatal hernia. Electronically Signed   By: MJeb LeveringM.D.   On: 11/08/2015 20:47    ASSESSMENT / PLAN: Pulmonary fibrosis.  Mr. OChanthavongappears to have a familial form of pulmonary fibrosis. This is strong history of pulmonary fibrosis on his mother's side. [place] a gene for the fibrosis. He himself was not tested and does not know if he carries a gene as well. On review of his CT scan he has findings of basal fibrosis, honeycombing, reticulation that suggestive of IPF. He has exposure to asbestos as well although it's not clear what the severity of this exposure is. This could be another  reason for pulmonary fibrosis.Marland Kitchen He does not have any signs and symptoms of rheumatic or autoimmune disease to explain the fibrosis. There is no suggestion of hypersensitivity pneumonitis.  I recommended that he get an evaluation including serologies for subclinical rheumatic disease, PFTs, and consider a biopsy. He could be a candidate for one of the newer therapies such as Pirfenidone or Nintedanib. All this can be done as an outpatient.   However he has  declined getting a workup. He feels that this would be a waste of money as he has long experience with pulmonary fibrosis in the family. He will discuss further with his wife and get in touch with [place] if he wants to continue with the evaluation. I have given him the number of our outpatient office to call if he desires an appointment.  He'll will need to be discharged on supplemental oxygen as he is desatting on exertion.  Please call up back with any new questions.  Marshell Garfinkel MD [facility] Pager [phone] If no answer or after 3pm call: [phone] 11/09/2015, 3:24 PM

## 2015-11-09 NOTE — Progress Notes (Addendum)
Patient Demographics  Steven Cameron, is a 80 y.o. male, DOB - 01/26/1935, [place] date - 11/08/2015   Admitting Physician Allyne Gee, MD  Outpatient Primary MD for the patient is Imagene Riches, NP  LOS - 1   Chief Complaint  Patient presents with  . Shortness of Breath         Subjective:   Steven Cameron today has, No headache, No chest pain, No abdominal pain - No Nausea, No Cough - SOB. Still complains of mild dyspnea.  Assessment & Plan    Active Problems:   Dyslipidemia   AKI (acute kidney injury) ([place])   ILD (interstitial lung disease) ([facility])   Shortness of breath   Elevated troponin   Hypoxia   NSTEMI (non-ST elevated myocardial infarction) ([facility])  Dyspnea/acute hypoxic respiratory failure - CT chest negative for PE, findings suspicious for ILD, crusted pulmonary input. - Continue with oxygen  Elevated troponin - Cardiology consult greatly appreciated, workup per cardiology - Follow on 2-D echo - Troponins trending down 0.16>>0.12>>0.12>>0.11  Hypertension - Continue with home medication  History of coronary artery disease - Continue with aspirin, Plavix, statin, valsartan, started on metoprolol by cardiology  Hyperlipidemia - Continue with statin  Incidental finding of sclerotic lesion in right humerus -   known history of prostate cancer, discussed with Dr.Ottolein patient urologist , patient had low PSA (0.22 ), low-grade malignancy on initial biopsy last September, unlikely related to prostate CA. - Discussed with Dr. Sharol Given, given nonaggressive characteristics, in fact it is sclerotic lesion, this can be followed as an outpatient, no indication for further workup during hospital stay, he can follow with Dr. Sharol Given as an outpatient.   Code Status: Full  Family Communication: None at bedside  Disposition Plan: Home when stable   Consults     Neurology PCCM   Medications  Scheduled Meds: . allopurinol  100 mg Oral BID  . aspirin EC  325 mg Oral Daily  . cholecalciferol  1,000 Units Oral Daily  . clopidogrel  75 mg Oral QHS  . enoxaparin (LOVENOX) injection  40 mg Subcutaneous Q24H  . irbesartan  150 mg Oral Daily   And  . hydrochlorothiazide  12.5 mg Oral Daily  . omega-3 acid ethyl esters  1 g Oral TID WC  . pantoprazole  40 mg Oral Daily  . pravastatin  10 mg Oral q1800   Continuous Infusions:  PRN Meds:.acetaminophen, ALPRAZolam, gi cocktail, HYDROcodone-acetaminophen, morphine injection, nitroGLYCERIN, ondansetron (ZOFRAN) IV, zolpidem  DVT Prophylaxis  Lovenox   Lab Results  Component Value Date   PLT 185 11/08/2015    Antibiotics    Anti-infectives    None          Objective:   Filed Vitals:   11/08/15 2259 11/09/15 0448 11/09/15 0739 11/09/15 1038  BP: 140/70 102/46 134/76 118/63  Pulse: 64 64 91 62  Temp: 97.5 F (36.4 C) 98.1 F (36.7 C) 97.7 F (36.5 C)   TempSrc: Oral Oral Oral   Resp: _0 Height: 6' 1" (1.854 m)     Weight: 88.1 kg (194 lb 3.6 oz)     SpO2: 95% 92% 90%     Wt Readings from Last 3 Encounters:  11/08/15 88.1 kg (194 lb 3.6 oz)  10/04/15 88.052 kg (194 lb 1.9 oz)  09/24/15 87.998 kg (194 lb)     Intake/Output Summary (Last 24 hours) at 11/09/15 1117 Last data filed at 11/09/15 0907  Gross per 24 hour  Intake    556 ml  Output    700 ml  Net   -144 ml     Physical Exam  Awake Alert, Oriented X 3, No new F.N deficits, Normal affect [place].AT,PERRAL Supple Neck,No JVD, No cervical lymphadenopathy appriciated.  Symmetrical Chest wall movement, Good air movement bilaterally. RRR,No Gallops,Rubs or new Murmurs, No Parasternal Heave +ve B.Sounds, Abd Soft, No tenderness, No organomegaly appriciated, No rebound - guarding or rigidity. No Cyanosis, Clubbing or edema, No new Rash or bruise     Data Review   Micro Results No results found for this or  any previous visit (from the past 240 hour(s)).  Radiology Reports Dg Chest 2 View  11/08/2015  CLINICAL DATA:  Shortness of breath for 2 months. EXAM: CHEST  2 VIEW COMPARISON:  05/14/2015; 01/29/2015; 10/01/2009 FINDINGS: Grossly unchanged enlarged cardiac silhouette and mediastinal contours post median sternotomy. There is persistent prominence the central pulmonary vasculature, stable since the 2010 examination. Mild pulmonary venous congestion without frank evidence of edema. Basilar and peripheral predominant slightly nodular interstitial thickening is unchanged. No new focal airspace opacities. No definite pleural effusion, though note, the bilateral costophrenic angles are excluded on the provided lateral radiograph. No acute osseus abnormalities. IMPRESSION: Similar findings of cardiomegaly, pulmonary venous congestion and chronic bronchitic change without definite superimposed acute cardiopulmonary disease. Electronically Signed   By: Sandi Mariscal M.D.   On: 11/08/2015 17:33   Ct Angio Chest Pe W/cm &/or Wo Cm  11/08/2015  CLINICAL DATA:  Shortness of breath over 2 days.  Elevated D-dimer. EXAM: CT ANGIOGRAPHY CHEST WITH CONTRAST TECHNIQUE: Multidetector CT imaging of the chest was performed using the standard protocol during bolus administration of intravenous contrast. Multiplanar CT image reconstructions and MIPs were obtained to evaluate the vascular anatomy. CONTRAST:  140m OMNIPAQUE IOHEXOL 350 MG/ML SOLN COMPARISON:  Chest radiograph earlier this date. FINDINGS: There are no filling defects within the pulmonary arteries to suggest pulmonary embolus. The heart is normal in size. Patient is post CABG with calcifications of the native coronaries. Tortuous thoracic aorta with atherosclerosis, no aneurysm. No periaortic soft tissue stranding. Multiple small mediastinal lymph nodes, not enlarged by size criteria. No pleural or pericardial effusion. Heterogeneous attenuation of the lung  parenchyma. There is subpleural reticulation and scattered ground-glass opacities. Mild central bronchial thickening. No definite bronchiectasis. No confluent pneumonia. No pulmonary mass or suspicious nodule. With moderate hiatal hernia. No acute abnormality in the upper abdomen. Pericatheter lesion adjacent to the inferior right lobe of the liver measures 2 cm and is unchanged from prior abdominal MRI of 12/04/2009 and considered benign. There are no acute or suspicious osseous abnormalities. Peripherally sclerotic lesion in the right proximal humerus measuring 1.8 cm transverse dimension, only partially included. No aggressive characteristics. Review of the MIP images confirms the above findings. IMPRESSION: 1. No pulmonary embolus. 2. Chronic lung findings that may reflect interstitial lung disease. Further characterization could be considered on a nonemergent basis with high-resolution chest CT based on clinical concern. 3. Peripherally sclerotic lesion in the right proximal humerus is only partially included, however has no suspicious characteristics. Given history of prostate cancer, bone scan could be considered to exclude metastatic disease. No abnormality ws noted on previous bone scan from  2010 in this region. 4. Moderate hiatal hernia. Electronically Signed   By: Jeb Levering M.D.   On: 11/08/2015 20:47     CBC  Recent Labs Lab 11/08/15 1714  WBC 6.6  HGB 13.7  HCT 41.2  PLT 185  MCV 94.5  MCH 31.4  MCHC 33.3  RDW 14.4  LYMPHSABS 1.3  MONOABS 0.3  EOSABS 0.1  BASOSABS 0.0    Chemistries   Recent Labs Lab 11/08/15 1714  NA 138  K 4.7  CL 102  CO2 25  GLUCOSE 102*  BUN 25*  CREATININE 1.35*  CALCIUM 9.3   ------------------------------------------------------------------------------------------------------------------ estimated creatinine clearance is 51 mL/min (by C-G formula based on Cr of  1.35). ------------------------------------------------------------------------------------------------------------------ No results for input(s): HGBA1C in the last 72 hours. ------------------------------------------------------------------------------------------------------------------ No results for input(s): CHOL, HDL, LDLCALC, TRIG, CHOLHDL, LDLDIRECT in the last 72 hours. ------------------------------------------------------------------------------------------------------------------ No results for input(s): TSH, T4TOTAL, T3FREE, THYROIDAB in the last 72 hours.  Invalid input(s): FREET3 ------------------------------------------------------------------------------------------------------------------ No results for input(s): VITAMINB12, FOLATE, FERRITIN, TIBC, IRON, RETICCTPCT in the last 72 hours.  Coagulation profile No results for input(s): INR, PROTIME in the last 168 hours.   Recent Labs  11/08/15 1730  DDIMER 0.66*    Cardiac Enzymes  Recent Labs Lab 11/09/15 0005 11/09/15 0230 11/09/15 0441  TROPONINI 0.12* 0.12* 0.11*   ------------------------------------------------------------------------------------------------------------------ Invalid input(s): POCBNP     Time Spent in minutes   35 Lacinda Axon, DAWOOD M.D on 11/09/2015 at 11:17 AM  Between 7am to 7pm - Pager - [phone]  After 7pm go to [email or web address] - password [facility]  [facility]   Office  [phone]

## 2015-11-09 NOTE — Progress Notes (Signed)
SATURATION QUALIFICATIONS: (This note is used to comply with regulatory documentation for home oxygen)  Patient Saturations on Room Air at Rest = 90%  Patient Saturations on Room Air while Ambulating = 76%  Patient Saturations on 2 Liters of oxygen while Ambulating = 89%  Please briefly explain why patient needs home oxygen: increase oxygen to 3lnc pt sats were 95%. Pt requires 3lnc

## 2015-11-09 NOTE — Progress Notes (Signed)
DAILY PROGRESS NOTE  Subjective:  No chest pain - dyspnea is improving. CT negative for PE, however, suspicious findings for ILD. Low elevation of troponin with a flat trend, which is more suspicious for demand ischemia in the setting of hypoxia. Echo performed late today - I personally reviewed images, there is right heart enlargement and severe pulmonary hypertension. RVSP is 78 mmHg. Pulmonary has discussed some treatment options with him regarding ILD and he doesn't believe they would be of any benefit.  Objective:  Temp:  [97.5 F (36.4 C)-98.1 F (36.7 C)] 97.5 F (36.4 C) (11/11 1343) Pulse Rate:  [62-91] 65 (11/11 1343) Resp:  [14-24] 18 (11/11 1343) BP: (102-140)/(46-76) 123/59 mmHg (11/11 1343) SpO2:  [82 %-97 %] 97 % (11/11 1343) Weight:  [194 lb 3.6 oz (88.1 kg)] 194 lb 3.6 oz (88.1 kg) (11/10 2259) Weight change:   Intake/Output from previous day: 11/10 0701 - 11/11 0700 In: -  Out: 400 [Urine:400]  Intake/Output from this shift: Total I/O In: 756 [P.O.:756] Out: 300 [Urine:300]  Medications: Current Facility-Administered Medications  Medication Dose Route Frequency Provider Last Rate Last Dose  . acetaminophen (TYLENOL) tablet 650 mg  650 mg Oral Q4H PRN Allyne Gee, MD      . allopurinol (ZYLOPRIM) tablet 100 mg  100 mg Oral BID Allyne Gee, MD   100 mg at 11/09/15 1037  . ALPRAZolam Duanne Moron) tablet 0.5 mg  0.5 mg Oral TID PRN Allyne Gee, MD   0.5 mg at 11/08/15 2329  . aspirin EC tablet 325 mg  325 mg Oral Daily Allyne Gee, MD   325 mg at 11/09/15 1038  . cholecalciferol (VITAMIN D) tablet 1,000 Units  1,000 Units Oral Daily Allyne Gee, MD   1,000 Units at 11/09/15 1038  . clopidogrel (PLAVIX) tablet 75 mg  75 mg Oral QHS Allyne Gee, MD   75 mg at 11/08/15 2328  . enoxaparin (LOVENOX) injection 40 mg  40 mg Subcutaneous Q24H Albertine Patricia, MD   40 mg at 11/09/15 1045  . gi cocktail (Maalox,Lidocaine,Donnatal)  30 mL Oral QID PRN Allyne Gee, MD      . irbesartan (AVAPRO) tablet 150 mg  150 mg Oral Daily Allyne Gee, MD   150 mg at 11/09/15 1038   And  . hydrochlorothiazide (MICROZIDE) capsule 12.5 mg  12.5 mg Oral Daily Allyne Gee, MD   12.5 mg at 11/09/15 1044  . HYDROcodone-acetaminophen (NORCO/VICODIN) 5-325 MG per tablet 1 tablet  1 tablet Oral Q6H PRN Allyne Gee, MD   1 tablet at 11/09/15 1224  . morphine 2 MG/ML injection 2 mg  2 mg Intravenous Q2H PRN Allyne Gee, MD      . nitroGLYCERIN (NITROSTAT) SL tablet 0.4 mg  0.4 mg Sublingual Q5 min PRN Allyne Gee, MD      . omega-3 acid ethyl esters (LOVAZA) capsule 1 g  1 g Oral TID WC Allyne Gee, MD   1 g at 11/09/15 1224  . ondansetron (ZOFRAN) injection 4 mg  4 mg Intravenous Q6H PRN Allyne Gee, MD      . pantoprazole (PROTONIX) EC tablet 40 mg  40 mg Oral Daily Allyne Gee, MD   40 mg at 11/09/15 1038  . pravastatin (PRAVACHOL) tablet 10 mg  10 mg Oral q1800 Allyne Gee, MD      . zolpidem [facility]) tablet 5 mg  5 mg Oral  QHS PRN Allyne Gee, MD        Physical Exam: deferred  Lab Results: Results for orders placed or performed during the hospital encounter of 11/08/15 (from the past 48 hour(s))  Basic metabolic panel     Status: Abnormal   Collection Time: 11/08/15  5:14 PM  Result Value Ref Range   Sodium 138 135 - 145 mmol/L   Potassium 4.7 3.5 - 5.1 mmol/L   Chloride 102 101 - 111 mmol/L   CO2 25 22 - 32 mmol/L   Glucose, Bld 102 (H) 65 - 99 mg/dL   BUN 25 (H) 6 - 20 mg/dL   Creatinine, Ser 1.35 (H) 0.61 - 1.24 mg/dL   Calcium 9.3 8.9 - 10.3 mg/dL   GFR calc non Af Amer 49 (L) >60 mL/min   GFR calc Af Amer 56 (L) >60 mL/min    Comment: (NOTE) The eGFR has been calculated using the CKD EPI equation. This calculation has not been validated in all clinical situations. eGFR's persistently <60 mL/min signify possible Chronic Kidney Disease.    Anion gap 11 5 - 15  CBC with Differential     Status: None   Collection Time: 11/08/15   5:14 PM  Result Value Ref Range   WBC 6.6 4.0 - 10.5 K/uL   RBC 4.36 4.22 - 5.81 MIL/uL   Hemoglobin 13.7 13.0 - 17.0 g/dL   HCT 41.2 39.0 - 52.0 %   MCV 94.5 78.0 - 100.0 fL   MCH 31.4 26.0 - 34.0 pg   MCHC 33.3 30.0 - 36.0 g/dL   RDW 14.4 11.5 - 15.5 %   Platelets 185 150 - 400 K/uL   Neutrophils Relative % 74 %   Neutro Abs 4.9 1.7 - 7.7 K/uL   Lymphocytes Relative 19 %   Lymphs Abs 1.3 0.7 - 4.0 K/uL   Monocytes Relative 5 %   Monocytes Absolute 0.3 0.1 - 1.0 K/uL   Eosinophils Relative 2 %   Eosinophils Absolute 0.1 0.0 - 0.7 K/uL   Basophils Relative 0 %   Basophils Absolute 0.0 0.0 - 0.1 K/uL  Brain natriuretic peptide     Status: Abnormal   Collection Time: 11/08/15  5:14 PM  Result Value Ref Range   B Natriuretic Peptide 125.4 (H) 0.0 - 100.0 pg/mL  Troponin I     Status: Abnormal   Collection Time: 11/08/15  5:15 PM  Result Value Ref Range   Troponin I 0.16 (H) <0.031 ng/mL    Comment:        PERSISTENTLY INCREASED TROPONIN VALUES IN THE RANGE OF 0.04-0.49 ng/mL CAN BE SEEN IN:       -UNSTABLE ANGINA       -CONGESTIVE HEART FAILURE       -MYOCARDITIS       -CHEST TRAUMA       -ARRYHTHMIAS       -LATE PRESENTING MYOCARDIAL INFARCTION       -COPD   CLINICAL FOLLOW-UP RECOMMENDED.   D-dimer, quantitative (not at [facility])     Status: Abnormal   Collection Time: 11/08/15  5:30 PM  Result Value Ref Range   D-Dimer, Quant 0.66 (H) 0.00 - 0.48 ug/mL-FEU    Comment:        AT THE INHOUSE ESTABLISHED CUTOFF VALUE OF 0.48 ug/mL FEU, THIS ASSAY HAS BEEN DOCUMENTED IN THE LITERATURE TO HAVE A SENSITIVITY AND NEGATIVE PREDICTIVE VALUE OF AT LEAST 98 TO 99%.  THE TEST RESULT SHOULD BE CORRELATED  WITH AN ASSESSMENT OF THE CLINICAL PROBABILITY OF DVT / VTE.   Troponin I-serum (0, 3, 6 hours)     Status: Abnormal   Collection Time: 11/09/15 12:05 AM  Result Value Ref Range   Troponin I 0.12 (H) <0.031 ng/mL    Comment:        PERSISTENTLY INCREASED TROPONIN VALUES  IN THE RANGE OF 0.04-0.49 ng/mL CAN BE SEEN IN:       -UNSTABLE ANGINA       -CONGESTIVE HEART FAILURE       -MYOCARDITIS       -CHEST TRAUMA       -ARRYHTHMIAS       -LATE PRESENTING MYOCARDIAL INFARCTION       -COPD   CLINICAL FOLLOW-UP RECOMMENDED.   Troponin I-serum (0, 3, 6 hours)     Status: Abnormal   Collection Time: 11/09/15  2:30 AM  Result Value Ref Range   Troponin I 0.12 (H) <0.031 ng/mL    Comment:        PERSISTENTLY INCREASED TROPONIN VALUES IN THE RANGE OF 0.04-0.49 ng/mL CAN BE SEEN IN:       -UNSTABLE ANGINA       -CONGESTIVE HEART FAILURE       -MYOCARDITIS       -CHEST TRAUMA       -ARRYHTHMIAS       -LATE PRESENTING MYOCARDIAL INFARCTION       -COPD   CLINICAL FOLLOW-UP RECOMMENDED.   Troponin I-serum (0, 3, 6 hours)     Status: Abnormal   Collection Time: 11/09/15  4:41 AM  Result Value Ref Range   Troponin I 0.11 (H) <0.031 ng/mL    Comment:        PERSISTENTLY INCREASED TROPONIN VALUES IN THE RANGE OF 0.04-0.49 ng/mL CAN BE SEEN IN:       -UNSTABLE ANGINA       -CONGESTIVE HEART FAILURE       -MYOCARDITIS       -CHEST TRAUMA       -ARRYHTHMIAS       -LATE PRESENTING MYOCARDIAL INFARCTION       -COPD   CLINICAL FOLLOW-UP RECOMMENDED.     Imaging: Dg Chest 2 View  11/08/2015  CLINICAL DATA:  Shortness of breath for 2 months. EXAM: CHEST  2 VIEW COMPARISON:  05/14/2015; 01/29/2015; 10/01/2009 FINDINGS: Grossly unchanged enlarged cardiac silhouette and mediastinal contours post median sternotomy. There is persistent prominence the central pulmonary vasculature, stable since the 2010 examination. Mild pulmonary venous congestion without frank evidence of edema. Basilar and peripheral predominant slightly nodular interstitial thickening is unchanged. No new focal airspace opacities. No definite pleural effusion, though note, the bilateral costophrenic angles are excluded on the provided lateral radiograph. No acute osseus abnormalities. IMPRESSION:  Similar findings of cardiomegaly, pulmonary venous congestion and chronic bronchitic change without definite superimposed acute cardiopulmonary disease. Electronically Signed   By: Sandi Mariscal M.D.   On: 11/08/2015 17:33   Ct Angio Chest Pe W/cm &/or Wo Cm  11/08/2015  CLINICAL DATA:  Shortness of breath over 2 days.  Elevated D-dimer. EXAM: CT ANGIOGRAPHY CHEST WITH CONTRAST TECHNIQUE: Multidetector CT imaging of the chest was performed using the standard protocol during bolus administration of intravenous contrast. Multiplanar CT image reconstructions and MIPs were obtained to evaluate the vascular anatomy. CONTRAST:  162m OMNIPAQUE IOHEXOL 350 MG/ML SOLN COMPARISON:  Chest radiograph earlier this date. FINDINGS: There are no filling defects within the pulmonary arteries to suggest pulmonary embolus.  The heart is normal in size. Patient is post CABG with calcifications of the native coronaries. Tortuous thoracic aorta with atherosclerosis, no aneurysm. No periaortic soft tissue stranding. Multiple small mediastinal lymph nodes, not enlarged by size criteria. No pleural or pericardial effusion. Heterogeneous attenuation of the lung parenchyma. There is subpleural reticulation and scattered ground-glass opacities. Mild central bronchial thickening. No definite bronchiectasis. No confluent pneumonia. No pulmonary mass or suspicious nodule. With moderate hiatal hernia. No acute abnormality in the upper abdomen. Pericatheter lesion adjacent to the inferior right lobe of the liver measures 2 cm and is unchanged from prior abdominal MRI of 12/04/2009 and considered benign. There are no acute or suspicious osseous abnormalities. Peripherally sclerotic lesion in the right proximal humerus measuring 1.8 cm transverse dimension, only partially included. No aggressive characteristics. Review of the MIP images confirms the above findings. IMPRESSION: 1. No pulmonary embolus. 2. Chronic lung findings that may reflect  interstitial lung disease. Further characterization could be considered on a nonemergent basis with high-resolution chest CT based on clinical concern. 3. Peripherally sclerotic lesion in the right proximal humerus is only partially included, however has no suspicious characteristics. Given history of prostate cancer, bone scan could be considered to exclude metastatic disease. No abnormality ws noted on previous bone scan from 2010 in this region. 4. Moderate hiatal hernia. Electronically Signed   By: Jeb Levering M.D.   On: 11/08/2015 20:47    Assessment:  1. Active Problems: 2.   Dyslipidemia 3.   AKI (acute kidney injury) ([place]) 4.   ILD (interstitial lung disease) ([place]) 5.   Shortness of breath 6.   Elevated troponin 7.   Hypoxia 8.   NSTEMI (non-ST elevated myocardial infarction) ([place]) 9.   Plan:  1. Flat troponin elevation due to right heart strain and severe pulmonary hypertension. This is not an acute coronary syndrome. Agree with oxygen for symptom relief of ILD. If he wishes to be more aggressive with treatment, could consider right heart cath and trial of pulmonary vasodilator for dyspnea, although this is not likely to impact the course of his disease. Does not seem to be any more dyspneic on metoprolol. Would continue this. Follow-up with Dr. Mare Ferrari or his designated successor as he is retiring in the near future. Cardiology will sign-off. Call with questions.  Time Spent Directly with Patient:  15 minutes  Length of Stay:  LOS: 1 day   Pixie Casino, MD, [facility] Attending Cardiologist [place] 11/09/2015, 3:13 PM

## 2015-11-09 NOTE — Progress Notes (Signed)
  Echocardiogram 2D Echocardiogram has been performed.  Steven Cameron 11/09/2015, 3:54 PM

## 2015-11-10 LAB — BASIC METABOLIC PANEL
Anion gap: 9 (ref 5–15)
BUN: 18 mg/dL (ref 6–20)
CHLORIDE: 101 mmol/L (ref 101–111)
CO2: 28 mmol/L (ref 22–32)
CREATININE: 1.09 mg/dL (ref 0.61–1.24)
Calcium: 9.2 mg/dL (ref 8.9–10.3)
GFR calc non Af Amer: >60 mL/min (ref >60)
Glucose, Bld: 96 mg/dL (ref 65–99)
Potassium: 4.5 mmol/L (ref 3.5–5.1)
Sodium: 138 mmol/L (ref 135–145)

## 2015-11-10 LAB — CBC
HEMATOCRIT: 40.2 % (ref 39.0–52.0)
HEMOGLOBIN: 13.2 g/dL (ref 13.0–17.0)
MCH: 30.9 pg (ref 26.0–34.0)
MCHC: 32.8 g/dL (ref 30.0–36.0)
MCV: 94.1 fL (ref 78.0–100.0)
Platelets: 169 10*3/uL (ref 150–400)
RBC: 4.27 MIL/uL (ref 4.22–5.81)
RDW: 14.2 % (ref 11.5–15.5)
WBC: 6 10*3/uL (ref 4.0–10.5)

## 2015-11-10 NOTE — Discharge Summary (Signed)
Steven Cameron, is a 80 y.o. male  DOB [birth year]  MRN [id number].  Admission date:  11/08/2015  Admitting Physician  Allyne Gee, MD  Discharge Date:  11/10/2015   Primary MD  Imagene Riches, NP  Recommendations for primary care physician for things to follow:  - Patient can follow with PCCM as an outpatient regarding further workup for ILD, he declined workup, he was given PCCM information on discharge if he desires an appointment. - Follow with Dr. Sharol Given as an outpatient regarding right humerus sclerotic lesion - Agent will be discharged on home oxygen.   Admission Diagnosis  Hypoxia [R09.02]   Discharge Diagnosis  Hypoxia [R09.02]    Active Problems:   Dyslipidemia   AKI (acute kidney injury) ([place])   ILD (interstitial lung disease) (HCC)   Shortness of breath   Elevated troponin   Hypoxia   Moderate to severe pulmonary hypertension ([place])      Past Medical History  Diagnosis Date  . IHD (ischemic heart disease)   . Arthritis   . Gout   . Sinus congestion   . GERD (gastroesophageal reflux disease)   . Hypertension   . Hyperlipidemia   . Neuropathy (HCC)     In both legs, below knee  . Depression   . Anxiety disorder   . Cancer [facility])     Prostate  . Gait disorder 03/22/2013  . Peripheral neuropathy (HCC)     Probable  . Coronary artery disease   . Foot drop, bilateral 03/22/2013    Past Surgical History  Procedure Laterality Date  . Cardiac catheterization  09/24/2004    EF 60%  . Coronary artery bypass graft  2004  . US echocardiography  01/19/2008    EF 55-60%  . Cardiovascular stress test  07/17/2010    EF 61%  . Radium seed implant      Prostate cancer  . Tonsillectomy    . Hemorrhoid surgery    . Cataract extraction, bilateral         History of present illness and  Hospital Course:     Kindly see H&P for history of present illness and admission details,  please review complete Labs, Consult reports and Test reports for all details in brief  HPI  from the history and physical done on the day of admission  Steven Cameron is a 80 y.o. male with history of CAD GERD HTN HLD presents with shortness of breath and noted elevated troponin. Patient states that he has had SOB for a while but over the last couple of days he has noted that there is increased difficulty getting around because of the shortness of breath. He has not had any chest pain., He states he has not had a cough. He has noted some dizziness. He has been feeling a little light headed. No syncope. He has chronic neuropathy from taking colchicine for treatment of gout. He states he is now on allopurinol. 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 He did not serve in active duty. Patient states that he has been a smoker. He quit 27 years ago.  Hospital Course   Dyspnea/acute hypoxic respiratory failure - CT chest negative for PE, findings suspicious for ILD, - Will need home oxygen on discharge  ILD/pulmonary fibrosis - Pulmonary consult greatly appreciated, patient has a medial form of pulmonary fibrosis, at this point he declined further workup, we'll discharge on home oxygen.  Elevated troponin - Cardiology consult greatly appreciated, has flat troponin elevation, this is secondary to right heart strain and severe pulmonary hypertension from primary lung disease. - Troponins trending down 0.16>>0.12>>0.12>>0.11  Hypertension - Continue with home medication  History of coronary artery disease - Continue with aspirin, Plavix, statin, valsartan, started on metoprolol by cardiology  Hyperlipidemia - Continue with statin  Incidental finding of sclerotic lesion in right humerus - known history of prostate cancer, discussed with Dr.Ottolein patient urologist , patient had low PSA (0.22 ),  low-grade malignancy on initial biopsy last September, unlikely related to prostate CA. - Discussed with Dr. Sharol Given, given nonaggressive characteristics, in fact it is sclerotic lesion, this can be followed as an outpatient, no indication for further workup during hospital stay, he can follow with Dr. Sharol Given as an outpatient.   Discharge Condition: Stable   Follow UP  Follow-up Information    Follow up with DUDA,MARCUS V, MD. Call in 2 weeks.   Specialty:  Orthopedic Surgery   Why:  Regarding sclerotic right humerus lesion   Contact information:   [place] [address] [phone]       Follow up with [facility].   Why:  will deliver concentrator to your home. please notify them when you arrive home.   Contact information:   [address] [place] [address] [phone]       Follow up with Imagene Riches, NP. Call in 1 week.   Why:  Posthospitalization follow-up   Contact information:   [place] [address] [phone]       Call Marshell Garfinkel, MD.   Specialty:  Pulmonary Disease   Contact information:   [address] [place] [address] [phone]         Discharge Instructions  and  Discharge Medications     Discharge Instructions    Diet - low sodium heart healthy    Complete by:  As directed      Discharge instructions    Complete by:  As directed   Follow with Primary MD Imagene Riches, NP in 7 days   Get CBC, CMP, 2 view Chest X ray checked  by Primary MD next visit.    Activity: As tolerated with Full fall precautions use walker/cane & assistance as needed   Disposition Home   Diet: Heart Healthy , with feeding assistance and aspiration precautions.  For Heart failure patients - Check your Weight same time everyday, if you gain over 2 pounds, or you develop in leg swelling, experience more shortness of breath or chest pain, call your Primary MD immediately. Follow Cardiac  Low Salt Diet and 1.5 lit/day fluid restriction.   On your next visit with your primary care physician please Get Medicines reviewed and adjusted.   Please request your Prim.MD to go over all Hospital Tests and Procedure/Radiological results at the follow up, please get all Hospital records sent  to your Prim MD by signing hospital release before you go home.   If you experience worsening of your admission symptoms, develop shortness of breath, life threatening emergency, suicidal or homicidal thoughts you must seek medical attention immediately by calling 911 or calling your MD immediately  if symptoms less severe.  You Must read complete instructions/literature along with all the possible adverse reactions/side effects for all the Medicines you take and that have been prescribed to you. Take any new Medicines after you have completely understood and accpet all the possible adverse reactions/side effects.   Do not drive, operating heavy machinery, perform activities at heights, swimming or participation in water activities or provide baby sitting services if your were admitted for syncope or siezures until you have seen by Primary MD or a Neurologist and advised to do so again.  Do not drive when taking Pain medications.    Do not take more than prescribed Pain, Sleep and Anxiety Medications  Special Instructions: If you have smoked or chewed Tobacco  in the last 2 yrs please stop smoking, stop any regular Alcohol  and or any Recreational drug use.  Wear Seat belts while driving.   Please note  You were cared for by a hospitalist during your hospital stay. If you have any questions about your discharge medications or the care you received while you were in the hospital after you are discharged, you can call the unit and asked to speak with the hospitalist on call if the hospitalist that took care of you is not available. Once you are discharged, your primary care physician will handle any  further medical issues. Please note that NO REFILLS for any discharge medications will be authorized once you are discharged, as it is imperative that you return to your primary care physician (or establish a relationship with a primary care physician if you do not have one) for your aftercare needs so that they can reassess your need for medications and monitor your lab values.     Increase activity slowly    Complete by:  As directed             Medication List    TAKE these medications        allopurinol 100 MG tablet  Commonly known as:  ZYLOPRIM  Take 1 tablet (100 mg total) by mouth 2 (two) times daily.     ALPRAZolam 0.5 MG tablet  Commonly known as:  XANAX  Take 0.5 mg by mouth 3 (three) times daily as needed for anxiety (anxiety).     aspirin 81 MG EC tablet  Take 81 mg by mouth daily.     cholecalciferol 1000 UNITS tablet  Commonly known as:  VITAMIN D  Take 1,000 Units by mouth daily.     clopidogrel 75 MG tablet  Commonly known as:  PLAVIX  Take 75 mg by mouth at bedtime.     furosemide 20 MG tablet  Commonly known as:  LASIX  TAKE 1 TABLET BY MOUTH DAILY     HYDROcodone-acetaminophen 5-325 MG tablet  Commonly known as:  NORCO/VICODIN  Take 5-325 tablets by mouth every 6 (six) hours as needed (pain).     lovastatin 10 MG tablet  Commonly known as:  MEVACOR  Take 10 mg by mouth daily.     LOVAZA 1 G capsule  Generic drug:  omega-3 acid ethyl esters  Take 1 g by mouth 3 (three) times daily.     nitroGLYCERIN 0.4 MG SL tablet  Commonly known as:  NITROSTAT  Place 0.4 mg under the tongue every 5 (five) minutes as needed. For chest pain     omeprazole 20 MG capsule  Commonly known as:  PRILOSEC  Take 20 mg by mouth 2 (two) times daily.     OSTEO BI-FLEX JOINT SHIELD PO  Take 1 tablet by mouth 2 (two) times daily.     potassium chloride SA 20 MEQ tablet  Commonly known as:  K-DUR,KLOR-CON  TAKE 1 TABLET BY MOUTH DAILY     Red Yeast Rice 600 MG Caps    Take 600 mg by mouth 2 (two) times daily.     SUPER B COMPLEX PO  Take 1 tablet by mouth daily.     valsartan-hydrochlorothiazide 160-12.5 MG tablet  Commonly known as:  DIOVAN HCT  Take 1 tablet by mouth daily.          Diet and Activity recommendation: See Discharge Instructions above   Consults obtained -  Pulmonary Cardiology   Major procedures and Radiology Reports - PLEASE review detailed and final reports for all details, in brief -      Dg Chest 2 View  11/08/2015  CLINICAL DATA:  Shortness of breath for 2 months. EXAM: CHEST  2 VIEW COMPARISON:  05/14/2015; 01/29/2015; 10/01/2009 FINDINGS: Grossly unchanged enlarged cardiac silhouette and mediastinal contours post median sternotomy. There is persistent prominence the central pulmonary vasculature, stable since the 2010 examination. Mild pulmonary venous congestion without frank evidence of edema. Basilar and peripheral predominant slightly nodular interstitial thickening is unchanged. No new focal airspace opacities. No definite pleural effusion, though note, the bilateral costophrenic angles are excluded on the provided lateral radiograph. No acute osseus abnormalities. IMPRESSION: Similar findings of cardiomegaly, pulmonary venous congestion and chronic bronchitic change without definite superimposed acute cardiopulmonary disease. Electronically Signed   By: Sandi Mariscal M.D.   On: 11/08/2015 17:33   Ct Angio Chest Pe W/cm &/or Wo Cm  11/08/2015  CLINICAL DATA:  Shortness of breath over 2 days.  Elevated D-dimer. EXAM: CT ANGIOGRAPHY CHEST WITH CONTRAST TECHNIQUE: Multidetector CT imaging of the chest was performed using the standard protocol during bolus administration of intravenous contrast. Multiplanar CT image reconstructions and MIPs were obtained to evaluate the vascular anatomy. CONTRAST:  182m OMNIPAQUE IOHEXOL 350 MG/ML SOLN COMPARISON:  Chest radiograph earlier this date. FINDINGS: There are no filling defects  within the pulmonary arteries to suggest pulmonary embolus. The heart is normal in size. Patient is post CABG with calcifications of the native coronaries. Tortuous thoracic aorta with atherosclerosis, no aneurysm. No periaortic soft tissue stranding. Multiple small mediastinal lymph nodes, not enlarged by size criteria. No pleural or pericardial effusion. Heterogeneous attenuation of the lung parenchyma. There is subpleural reticulation and scattered ground-glass opacities. Mild central bronchial thickening. No definite bronchiectasis. No confluent pneumonia. No pulmonary mass or suspicious nodule. With moderate hiatal hernia. No acute abnormality in the upper abdomen. Pericatheter lesion adjacent to the inferior right lobe of the liver measures 2 cm and is unchanged from prior abdominal MRI of 12/04/2009 and considered benign. There are no acute or suspicious osseous abnormalities. Peripherally sclerotic lesion in the right proximal humerus measuring 1.8 cm transverse dimension, only partially included. No aggressive characteristics. Review of the MIP images confirms the above findings. IMPRESSION: 1. No pulmonary embolus. 2. Chronic lung findings that may reflect interstitial lung disease. Further characterization could be considered on a nonemergent basis with high-resolution chest CT based on clinical concern. 3. Peripherally sclerotic lesion in  the right proximal humerus is only partially included, however has no suspicious characteristics. Given history of prostate cancer, bone scan could be considered to exclude metastatic disease. No abnormality ws noted on previous bone scan from 2010 in this region. 4. Moderate hiatal hernia. Electronically Signed   By: Jeb Levering M.D.   On: 11/08/2015 20:47    Micro Results     No results found for this or any previous visit (from the past 240 hour(s)).     Today   Subjective:   Najir Roop today has no headache,no chest or abdominal pain,no new  weakness tingling or numbness, feels much better wants to go home today. Dyspnea at baseline  Objective:   Blood pressure 106/61, pulse 62, temperature 98.1 F (36.7 C), temperature source Oral, resp. rate 17, height 6' 1" (1.854 m), weight 88.1 kg (194 lb 3.6 oz), SpO2 94 %.   Intake/Output Summary (Last 24 hours) at 11/10/15 1002 Last data filed at 11/10/15 0600  Gross per 24 hour  Intake    200 ml  Output    300 ml  Net   -100 ml    Exam Awake Alert, Oriented x 3, No new F.N deficits, Normal affect .AT,PERRAL Supple Neck,No JVD, No cervical lymphadenopathy appriciated.  Symmetrical Chest wall movement, Good air movement bilaterally,  RRR,No Gallops,Rubs or new Murmurs, No Parasternal Heave +ve B.Sounds, Abd Soft, Non tender, No organomegaly appriciated, No rebound -guarding or rigidity. No Cyanosis, Clubbing or edema, No new Rash or bruise  Data Review   CBC w Diff: Lab Results  Component Value Date   WBC 6.0 11/10/2015   HGB 13.2 11/10/2015   HCT 40.2 11/10/2015   PLT 169 11/10/2015   LYMPHOPCT 19 11/08/2015   MONOPCT 5 11/08/2015   EOSPCT 2 11/08/2015   BASOPCT 0 11/08/2015    CMP: Lab Results  Component Value Date   NA 138 11/10/2015   K 4.5 11/10/2015   CL 101 11/10/2015   CO2 28 11/10/2015   BUN 18 11/10/2015   CREATININE 1.09 11/10/2015   CREATININE 1.20* 10/04/2015   PROT 7.5 10/04/2015   ALBUMIN 4.4 10/04/2015   BILITOT 0.6 10/04/2015   ALKPHOS 59 10/04/2015   AST 24 10/04/2015   ALT 26 10/04/2015  .   Total Time in preparing paper work, data evaluation and todays exam - 35 minutes  Delante Karapetyan M.D on 11/10/2015 at 10:02 AM  [facility]   Office  [phone]

## 2015-11-10 NOTE — Care Management (Signed)
CM spoke with patient at the bedside. Patient states his mother had oxygen for several years. He is knowledgeable about home oxygen use. Steven Cameron at [facility] notified for home oxygen need and plan for discharge today. Presenter, broadcasting BSN CCM

## 2015-11-10 NOTE — Discharge Instructions (Signed)
Follow with Primary MD Imagene Riches, NP in 7 days   Get CBC, CMP, 2 view Chest X ray checked  by Primary MD next visit.    Activity: As tolerated with Full fall precautions use walker/cane & assistance as needed   Disposition Home   Diet: Heart Healthy , with feeding assistance and aspiration precautions.  For Heart failure patients - Check your Weight same time everyday, if you gain over 2 pounds, or you develop in leg swelling, experience more shortness of breath or chest pain, call your Primary MD immediately. Follow Cardiac Low Salt Diet and 1.5 lit/day fluid restriction.   On your next visit with your primary care physician please Get Medicines reviewed and adjusted.   Please request your Prim.MD to go over all Hospital Tests and Procedure/Radiological results at the follow up, please get all Hospital records sent to your Prim MD by signing hospital release before you go home.   If you experience worsening of your admission symptoms, develop shortness of breath, life threatening emergency, suicidal or homicidal thoughts you must seek medical attention immediately by calling 911 or calling your MD immediately  if symptoms less severe.  You Must read complete instructions/literature along with all the possible adverse reactions/side effects for all the Medicines you take and that have been prescribed to you. Take any new Medicines after you have completely understood and accpet all the possible adverse reactions/side effects.   Do not drive, operating heavy machinery, perform activities at heights, swimming or participation in water activities or provide baby sitting services if your were admitted for syncope or siezures until you have seen by Primary MD or a Neurologist and advised to do so again.  Do not drive when taking Pain medications.    Do not take more than prescribed Pain, Sleep and Anxiety Medications  Special Instructions: If you have smoked or chewed Tobacco  in the  last 2 yrs please stop smoking, stop any regular Alcohol  and or any Recreational drug use.  Wear Seat belts while driving.   Please note  You were cared for by a hospitalist during your hospital stay. If you have any questions about your discharge medications or the care you received while you were in the hospital after you are discharged, you can call the unit and asked to speak with the hospitalist on call if the hospitalist that took care of you is not available. Once you are discharged, your primary care physician will handle any further medical issues. Please note that NO REFILLS for any discharge medications will be authorized once you are discharged, as it is imperative that you return to your primary care physician (or establish a relationship with a primary care physician if you do not have one) for your aftercare needs so that they can reassess your need for medications and monitor your lab values.

## 2015-12-18 ENCOUNTER — Ambulatory Visit (INDEPENDENT_AMBULATORY_CARE_PROVIDER_SITE_OTHER): Payer: Medicare Other | Admitting: Pulmonary Disease

## 2015-12-18 ENCOUNTER — Other Ambulatory Visit: Payer: Medicare Other

## 2015-12-18 ENCOUNTER — Encounter: Payer: Self-pay | Admitting: Pulmonary Disease

## 2015-12-18 VITALS — BP 124/72 | HR 71 | Ht 72.0 in | Wt 189.8 lb

## 2015-12-18 DIAGNOSIS — I272 Other secondary pulmonary hypertension: Secondary | ICD-10-CM | POA: Diagnosis not present

## 2015-12-18 DIAGNOSIS — J849 Interstitial pulmonary disease, unspecified: Secondary | ICD-10-CM | POA: Diagnosis not present

## 2015-12-18 LAB — RHEUMATOID FACTOR

## 2015-12-18 NOTE — Addendum Note (Signed)
Addended by: Parke Poisson E on: 12/18/2015 04:27 PM   Modules accepted: Orders

## 2015-12-18 NOTE — Progress Notes (Signed)
Subjective:    Patient ID: Steven Cameron, male    DOB: August 29, 1935, 80 y.o.   MRN: [id number]  HPI Hospital follow-up for evaluation of ILD, pulmonary hypertension.   Steven Cameron is a 80 year old with history of hypertension, coronary artery disease, gout. Admitted to [facility] on 11/08/15 with dyspnea, hypoxemia, and nSTEMI. Found to have pulmonary fibrosis on CT and severe pulmonary hypertension.  He has history of gout and was on colchicine in the past. This was stopped secondary to neuropathy. He is currently on allopurinol. He also has history of osteoarthritis, diastolic congestive heart failure, coronary artery disease. He worked in various jobs initially in a Copy. 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 He may have had significant exposure to asbestos during this time. 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 He lives with his wife. He has a 75-pack-year history of smoking. Quit around 1995.  This is significant family history of pulmonary fibrosis on his mother's side. His mother, aunt and cousins all suffered from pulmonary fibrosis. He was told that his mother developed pulmonary fibrosis from medication for arthritis. He does not recall what this medication is. His family was enrolled in a gene study for pulmonary fibrosis at [facility] about 5 years ago. He was told that his family had a susceptibility gene for primary fibrosis. He does not know if he carries a gene as well and he does not know what this gene is. There is history of prostate cancer on his father's side.   He denies any other exposures except for asbestos. He denies any joint pain except for gout and osteoarthritis. He denies any signs and symptoms of connective tissue disease or autoimmune disease. I had recommended a workup but he initially refused and was discharged. However he returns now for follow-up accompanied by his wife. He was  evaluated for oxygen and discharged on 2 L O2 24/7.  FAMILY HISTORY:  family history includes Alzheimer's disease (age of onset: 52) in his mother; Coronary artery disease in his father; Prostate cancer in his father.  SOCIAL HISTORY:  reports that he quit smoking about 24 years ago. He has never used smokeless tobacco. He reports that he does not drink alcohol or use illicit drugs.  CT scan 11/08/15 No PEs, Basal fibrosis, honeycombing, reticulation.  Echo (11/09/15) - Left ventricle: The cavity size was normal. Wall thickness was normal. Systolic function was normal. The estimated ejection fraction was in the range of 60% to 65%. Wall motion was normal; there were no regional wall motion abnormalities. Doppler parameters are consistent with abnormal left ventricular relaxation (grade 1 diastolic dysfunction). - Left atrium: The atrium was mildly dilated. - Right ventricle: The cavity size was moderately dilated. Systolic function was moderately reduced. - Right atrium: The atrium was moderately dilated. - Pulmonary arteries: Systolic pressure was severely increased. PA peak pressure: 78 mm Hg (S).  Past Medical History  Diagnosis Date  . IHD (ischemic heart disease)   . Arthritis   . Gout   . Sinus congestion   . GERD (gastroesophageal reflux disease)   . Hypertension   . Hyperlipidemia   . Neuropathy (HCC)     In both legs, below knee  . Depression   . Anxiety disorder   . Cancer [facility])     Prostate  . Gait disorder 03/22/2013  . Peripheral neuropathy (HCC)     Probable  .  Coronary artery disease   . Foot drop, bilateral 03/22/2013     Current outpatient prescriptions:  .  allopurinol (ZYLOPRIM) 100 MG tablet, Take 1 tablet (100 mg total) by mouth 2 (two) times daily., Disp: 60 tablet, Rfl: 11 .  ALPRAZolam (XANAX) 0.5 MG tablet, Take 0.5 mg by mouth 3 (three) times daily as needed for anxiety (anxiety)., Disp: , Rfl:  .  aspirin 81 MG EC tablet, Take  81 mg by mouth daily.  , Disp: , Rfl:  .  B Complex-C (SUPER B COMPLEX PO), Take 1 tablet by mouth daily., Disp: , Rfl:  .  cholecalciferol (VITAMIN D) 1000 UNITS tablet, Take 1,000 Units by mouth daily., Disp: , Rfl:  .  clopidogrel (PLAVIX) 75 MG tablet, Take 75 mg by mouth at bedtime. , Disp: , Rfl:  .  furosemide (LASIX) 20 MG tablet, TAKE 1 TABLET BY MOUTH DAILY, Disp: 90 tablet, Rfl: 0 .  HYDROcodone-acetaminophen (NORCO/VICODIN) 5-325 MG per tablet, Take 5-325 tablets by mouth every 6 (six) hours as needed (pain). , Disp: , Rfl:  .  lovastatin (MEVACOR) 10 MG tablet, Take 10 mg by mouth daily., Disp: , Rfl:  .  Misc Natural Products (OSTEO BI-FLEX JOINT SHIELD PO), Take 1 tablet by mouth 2 (two) times daily. , Disp: , Rfl:  .  nitroGLYCERIN (NITROSTAT) 0.4 MG SL tablet, Place 0.4 mg under the tongue every 5 (five) minutes as needed. For chest pain, Disp: , Rfl:  .  omega-3 acid ethyl esters (LOVAZA) 1 G capsule, Take 1 g by mouth 3 (three) times daily., Disp: , Rfl:  .  omeprazole (PRILOSEC) 20 MG capsule, Take 20 mg by mouth 2 (two) times daily., Disp: , Rfl:  .  potassium chloride SA (K-DUR,KLOR-CON) 20 MEQ tablet, TAKE 1 TABLET BY MOUTH DAILY, Disp: 30 tablet, Rfl: 10 .  Red Yeast Rice 600 MG CAPS, Take 600 mg by mouth 2 (two) times daily., Disp: , Rfl:  .  valsartan-hydrochlorothiazide (DIOVAN HCT) 160-12.5 MG per tablet, Take 1 tablet by mouth daily., Disp: 90 tablet, Rfl: 3  Review of Systems Dyspnea on exertion. Denies any fevers, chills, cough, sputum production, wheezing, hemoptysis. Denies any chest pain, palpitations. Denies any nausea, vomiting, diarrhea, consultation. All other review of systems negative.    Objective:   Physical Exam Blood pressure 124/72, pulse 71, height 6' (1.829 m), weight 189 lb 12.8 oz (86.093 kg), SpO2 95 %.  General: Very pleasant gentleman. Awake, oriented, no distress. Neuro: Cranial nerves intact, no gross focal deficit. HEENT: Moist  mucous membranes, no JVD, lymphadenopathy. Cardiovascular: Regular rate and rhythm, no murmurs rubs gallops Lungs: Faint bibasilar crackles. No wheeze. Nonlabored breathing. Abdomen: Soft, positive bowel sounds, nontender, nondistended. Skin: Intact.    Assessment & Plan:  Pulmonary fibrosis, pulmonary hypertension.  Steven Cameron appears to have a familial form of pulmonary fibrosis. This is strong history of pulmonary fibrosis on his mother's side. His family was in fact enrolled in a study from [place] and was told that the family carries a gene for the fibrosis. He himself was not tested and does not know if he carries a gene as well. On review of his CT scan he has findings of basal fibrosis, honeycombing, reticulation that suggestive of IPF. He has exposure to asbestos as well although it's not clear what the severity of this exposure is. This could be another reason for pulmonary fibrosis.Marland Kitchen He does not have any signs and symptoms of rheumatic or autoimmune disease to explain  the fibrosis. There is no suggestion of hypersensitivity pneumonitis. His pulmonary hypertension may be related to his ILD. There is no evidence of PEs on the CTA.  I initially recommended that he get an evaluation including serologies for subclinical rheumatic disease, PFTs, and consider a biopsy. He could be a candidate for one of the newer therapies such as Pirfenidone or Nintedanib or treatment for pulmonary hypertension. He was reluctant when we met in hospital to proceed with any form of workup. However he appears to have been persuaded by his wife to come into clinic today. He has agreed to get baseline labs, lung function tests and monitoring. However he still does not want any aggressive treatments.  Plan: -  Pulmonary function tests - Ambulatory O2 measurement, 6 minute walk test. - Serologies for ILD, autoimmune disease. - Return to clinic in 3 months.  Marshell Garfinkel MD [facility] Pager [phone] If no answer or after 3pm call: [phone] 12/18/2015, 4:01 PM

## 2015-12-18 NOTE — Patient Instructions (Signed)
We'll get blood work to evaluate for causes of fibrosis, pulmonary hypertension. You will be scheduled for pulmonary function test.  Return to clinic in 6 months.

## 2015-12-19 LAB — CYCLIC CITRUL PEPTIDE ANTIBODY, IGG

## 2015-12-19 LAB — ANA: ANA: NEGATIVE

## 2015-12-19 LAB — ANTI-DNA ANTIBODY, DOUBLE-STRANDED: ds DNA Ab: <1 IU/mL

## 2015-12-19 LAB — ANTI-SCLERODERMA ANTIBODY: Scleroderma (Scl-70) (ENA) Antibody, IgG: <1

## 2015-12-19 LAB — SJOGREN'S SYNDROME ANTIBODS(SSA + SSB)
SSA (RO) (ENA) ANTIBODY, IGG: NEGATIVE
SSB (LA) (ENA) ANTIBODY, IGG: NEGATIVE

## 2015-12-19 LAB — ANTI-SMITH ANTIBODY: ENA SM AB SER-ACNC: NEGATIVE

## 2015-12-19 LAB — ANTI-JO 1 ANTIBODY, IGG: Anti JO-1: <0.2 AI (ref 0.0–0.9)

## 2015-12-19 LAB — RNP ANTIBODY: Ribonucleic Protein(ENA) Antibody, IgG: <1

## 2015-12-21 LAB — ALDOLASE: Aldolase: 7.2 U/L (ref <=8.1)

## 2015-12-30 DIAGNOSIS — I25810 Atherosclerosis of coronary artery bypass graft(s) without angina pectoris: Secondary | ICD-10-CM

## 2015-12-30 HISTORY — DX: Atherosclerosis of coronary artery bypass graft(s) without angina pectoris: I25.810

## 2016-01-01 ENCOUNTER — Telehealth: Payer: Self-pay | Admitting: Cardiology

## 2016-01-01 MED ORDER — FUROSEMIDE 20 MG PO TABS: 20.0000 mg | ORAL_TABLET | Freq: Every day | ORAL | Status: DC

## 2016-01-01 NOTE — Telephone Encounter (Signed)
New message    Patient calling     *STAT* If patient is at the pharmacy, call can be transferred to refill team.   1. Which medications need to be refilled? (please list name of each medication and dose if known) furosemide 20 mg  2. Which pharmacy/location (including street and city if local pharmacy) is medication to be sent to? [facility] in [place]  [phone]   3. Do they need a 30 day or 90 day supply? 90 days

## 2016-01-04 ENCOUNTER — Telehealth: Payer: Self-pay | Admitting: Pulmonary Disease

## 2016-01-04 NOTE — Telephone Encounter (Signed)
Called spoke with spouse and is aware of lab results and had no questions. Nothing further needed

## 2016-01-04 NOTE — Telephone Encounter (Signed)
Please let him know that they are normal.

## 2016-01-04 NOTE — Telephone Encounter (Signed)
Pt had labs done 12/18/15. Called spoke with pt spouse and they are requesting pt lab results. Please advise thanks

## 2016-01-08 DIAGNOSIS — I2119 ST elevation (STEMI) myocardial infarction involving other coronary artery of inferior wall: Secondary | ICD-10-CM | POA: Diagnosis present

## 2016-01-25 DIAGNOSIS — I25810 Atherosclerosis of coronary artery bypass graft(s) without angina pectoris: Secondary | ICD-10-CM | POA: Diagnosis present

## 2016-01-26 ENCOUNTER — Encounter (HOSPITAL_COMMUNITY): Payer: Self-pay | Admitting: *Deleted

## 2016-01-26 ENCOUNTER — Inpatient Hospital Stay (HOSPITAL_COMMUNITY)
Admission: EM | Admit: 2016-01-26 | Discharge: 2016-01-29 | DRG: 246 | Disposition: A | Discharge status: Home / Self Care | Payer: Medicare Other | Attending: Cardiology | Admitting: Cardiology

## 2016-01-26 ENCOUNTER — Emergency Department (HOSPITAL_COMMUNITY): Payer: Medicare Other

## 2016-01-26 DIAGNOSIS — Z888 Allergy status to other drugs, medicaments and biological substances status: Secondary | ICD-10-CM | POA: Diagnosis not present

## 2016-01-26 DIAGNOSIS — Z7982 Long term (current) use of aspirin: Secondary | ICD-10-CM

## 2016-01-26 DIAGNOSIS — M109 Gout, unspecified: Secondary | ICD-10-CM | POA: Diagnosis present

## 2016-01-26 DIAGNOSIS — M21372 Foot drop, left foot: Secondary | ICD-10-CM | POA: Diagnosis present

## 2016-01-26 DIAGNOSIS — Z8249 Family history of ischemic heart disease and other diseases of the circulatory system: Secondary | ICD-10-CM

## 2016-01-26 DIAGNOSIS — N179 Acute kidney failure, unspecified: Secondary | ICD-10-CM | POA: Diagnosis present

## 2016-01-26 DIAGNOSIS — Y838 Other surgical procedures as the cause of abnormal reaction of the patient, or of later complication, without mention of misadventure at the time of the procedure: Secondary | ICD-10-CM | POA: Diagnosis not present

## 2016-01-26 DIAGNOSIS — Z87891 Personal history of nicotine dependence: Secondary | ICD-10-CM

## 2016-01-26 DIAGNOSIS — Z8042 Family history of malignant neoplasm of prostate: Secondary | ICD-10-CM

## 2016-01-26 DIAGNOSIS — M21371 Foot drop, right foot: Secondary | ICD-10-CM | POA: Diagnosis present

## 2016-01-26 DIAGNOSIS — I25810 Atherosclerosis of coronary artery bypass graft(s) without angina pectoris: Secondary | ICD-10-CM | POA: Diagnosis present

## 2016-01-26 DIAGNOSIS — E785 Hyperlipidemia, unspecified: Secondary | ICD-10-CM | POA: Diagnosis not present

## 2016-01-26 DIAGNOSIS — I213 ST elevation (STEMI) myocardial infarction of unspecified site: Secondary | ICD-10-CM

## 2016-01-26 DIAGNOSIS — R001 Bradycardia, unspecified: Secondary | ICD-10-CM | POA: Diagnosis not present

## 2016-01-26 DIAGNOSIS — Z79899 Other long term (current) drug therapy: Secondary | ICD-10-CM

## 2016-01-26 DIAGNOSIS — I214 Non-ST elevation (NSTEMI) myocardial infarction: Secondary | ICD-10-CM | POA: Diagnosis present

## 2016-01-26 DIAGNOSIS — G629 Polyneuropathy, unspecified: Secondary | ICD-10-CM

## 2016-01-26 DIAGNOSIS — I130 Hypertensive heart and chronic kidney disease with heart failure and stage 1 through stage 4 chronic kidney disease, or unspecified chronic kidney disease: Secondary | ICD-10-CM | POA: Diagnosis present

## 2016-01-26 DIAGNOSIS — Z886 Allergy status to analgesic agent status: Secondary | ICD-10-CM

## 2016-01-26 DIAGNOSIS — I2510 Atherosclerotic heart disease of native coronary artery without angina pectoris: Secondary | ICD-10-CM | POA: Diagnosis present

## 2016-01-26 DIAGNOSIS — I25710 Atherosclerosis of autologous vein coronary artery bypass graft(s) with unstable angina pectoris: Secondary | ICD-10-CM | POA: Diagnosis not present

## 2016-01-26 DIAGNOSIS — T82858A Stenosis of vascular prosthetic devices, implants and grafts, initial encounter: Principal | ICD-10-CM | POA: Diagnosis present

## 2016-01-26 DIAGNOSIS — F419 Anxiety disorder, unspecified: Secondary | ICD-10-CM | POA: Diagnosis present

## 2016-01-26 DIAGNOSIS — K219 Gastro-esophageal reflux disease without esophagitis: Secondary | ICD-10-CM | POA: Diagnosis present

## 2016-01-26 DIAGNOSIS — F329 Major depressive disorder, single episode, unspecified: Secondary | ICD-10-CM | POA: Diagnosis present

## 2016-01-26 DIAGNOSIS — Z7902 Long term (current) use of antithrombotics/antiplatelets: Secondary | ICD-10-CM | POA: Diagnosis not present

## 2016-01-26 DIAGNOSIS — I272 Other secondary pulmonary hypertension: Secondary | ICD-10-CM | POA: Diagnosis present

## 2016-01-26 DIAGNOSIS — C61 Malignant neoplasm of prostate: Secondary | ICD-10-CM | POA: Diagnosis present

## 2016-01-26 DIAGNOSIS — I119 Hypertensive heart disease without heart failure: Secondary | ICD-10-CM | POA: Diagnosis not present

## 2016-01-26 DIAGNOSIS — I509 Heart failure, unspecified: Secondary | ICD-10-CM | POA: Diagnosis not present

## 2016-01-26 DIAGNOSIS — Z951 Presence of aortocoronary bypass graft: Secondary | ICD-10-CM | POA: Diagnosis not present

## 2016-01-26 DIAGNOSIS — N183 Chronic kidney disease, stage 3 (moderate): Secondary | ICD-10-CM | POA: Diagnosis present

## 2016-01-26 DIAGNOSIS — I5021 Acute systolic (congestive) heart failure: Secondary | ICD-10-CM | POA: Diagnosis present

## 2016-01-26 DIAGNOSIS — I110 Hypertensive heart disease with heart failure: Secondary | ICD-10-CM | POA: Diagnosis not present

## 2016-01-26 DIAGNOSIS — I10 Essential (primary) hypertension: Secondary | ICD-10-CM | POA: Diagnosis present

## 2016-01-26 HISTORY — DX: Malignant neoplasm of prostate: C61

## 2016-01-26 LAB — CBC
HEMATOCRIT: 39.2 % (ref 39.0–52.0)
HEMOGLOBIN: 13.2 g/dL (ref 13.0–17.0)
MCH: 30.6 pg (ref 26.0–34.0)
MCHC: 33.7 g/dL (ref 30.0–36.0)
MCV: 91 fL (ref 78.0–100.0)
Platelets: 179 10*3/uL (ref 150–400)
RBC: 4.31 MIL/uL (ref 4.22–5.81)
RDW: 13.3 % (ref 11.5–15.5)
WBC: 6.7 10*3/uL (ref 4.0–10.5)

## 2016-01-26 LAB — I-STAT CHEM 8, ED
BUN: 45 mg/dL — ABNORMAL HIGH (ref 6–20)
CALCIUM ION: 1.19 mmol/L (ref 1.13–1.30)
CREATININE: 1.4 mg/dL — AB (ref 0.61–1.24)
Chloride: 98 mmol/L — ABNORMAL LOW (ref 101–111)
GLUCOSE: 98 mg/dL (ref 65–99)
HCT: 43 % (ref 39.0–52.0)
HEMOGLOBIN: 14.6 g/dL (ref 13.0–17.0)
POTASSIUM: 4.3 mmol/L (ref 3.5–5.1)
Sodium: 136 mmol/L (ref 135–145)
TCO2: 28 mmol/L (ref 0–100)

## 2016-01-26 LAB — BASIC METABOLIC PANEL
ANION GAP: 13 (ref 5–15)
BUN: 39 mg/dL — ABNORMAL HIGH (ref 6–20)
CO2: 24 mmol/L (ref 22–32)
Calcium: 9.4 mg/dL (ref 8.9–10.3)
Chloride: 97 mmol/L — ABNORMAL LOW (ref 101–111)
Creatinine, Ser: 1.37 mg/dL — ABNORMAL HIGH (ref 0.61–1.24)
GFR calc Af Amer: 55 mL/min — ABNORMAL LOW (ref >60)
GFR, EST NON AFRICAN AMERICAN: 48 mL/min — AB (ref >60)
GLUCOSE: 102 mg/dL — AB (ref 65–99)
POTASSIUM: 4.6 mmol/L (ref 3.5–5.1)
Sodium: 134 mmol/L — ABNORMAL LOW (ref 135–145)

## 2016-01-26 LAB — PROTIME-INR
INR: 1.03 (ref 0.00–1.49)
PROTHROMBIN TIME: 13.7 s (ref 11.6–15.2)

## 2016-01-26 LAB — MRSA PCR SCREENING: MRSA BY PCR: NEGATIVE

## 2016-01-26 LAB — I-STAT TROPONIN, ED: Troponin i, poc: 3.24 ng/mL (ref 0.00–0.08)

## 2016-01-26 LAB — APTT: APTT: 27 s (ref 24–37)

## 2016-01-26 LAB — TROPONIN I
TROPONIN I: 7.34 ng/mL — AB (ref <0.031)
Troponin I: 6.33 ng/mL (ref <0.031)

## 2016-01-26 MED ORDER — ZOLPIDEM TARTRATE 5 MG PO TABS
5.0000 mg | ORAL_TABLET | Freq: Every evening | ORAL | Status: DC | PRN
  Filled 2016-01-26: qty 1

## 2016-01-26 MED ORDER — ONDANSETRON HCL 4 MG/2ML IJ SOLN
4.0000 mg | Freq: Four times a day (QID) | INTRAMUSCULAR | Status: DC | PRN
Start: 2016-01-26 — End: 2016-01-29

## 2016-01-26 MED ORDER — HEPARIN BOLUS VIA INFUSION
4000.0000 [IU] | Freq: Once | INTRAVENOUS | Status: DC
  Filled 2016-01-26: qty 4000

## 2016-01-26 MED ORDER — METOPROLOL TARTRATE 25 MG PO TABS
25.0000 mg | ORAL_TABLET | Freq: Two times a day (BID) | ORAL | Status: DC
  Administered 2016-01-26 – 2016-01-29 (×6): 25 mg via ORAL
  Filled 2016-01-26 (×6): qty 1

## 2016-01-26 MED ORDER — NITROGLYCERIN IN D5W 200-5 MCG/ML-% IV SOLN
0.0000 ug/min | Freq: Once | INTRAVENOUS | Status: AC
  Administered 2016-01-26: 5 ug/min via INTRAVENOUS
  Filled 2016-01-26: qty 250

## 2016-01-26 MED ORDER — SODIUM CHLORIDE 0.9 % IV BOLUS (SEPSIS)
250.0000 mL | Freq: Once | INTRAVENOUS | Status: AC
  Administered 2016-01-26: 250 mL via INTRAVENOUS

## 2016-01-26 MED ORDER — ACETAMINOPHEN 325 MG PO TABS: 650.0000 mg | ORAL_TABLET | ORAL | Status: DC | PRN

## 2016-01-26 MED ORDER — RED YEAST RICE 600 MG PO CAPS: 600.0000 mg | ORAL_CAPSULE | Freq: Two times a day (BID) | ORAL | Status: DC

## 2016-01-26 MED ORDER — HEPARIN (PORCINE) IN NACL 100-0.45 UNIT/ML-% IJ SOLN
1400.0000 [IU]/h | INTRAMUSCULAR | Status: DC
  Administered 2016-01-26: 1200 [IU]/h via INTRAVENOUS
  Filled 2016-01-26: qty 250

## 2016-01-26 MED ORDER — ASPIRIN 81 MG PO CHEW
CHEWABLE_TABLET | ORAL | Status: AC
  Filled 2016-01-26: qty 4

## 2016-01-26 MED ORDER — HYDROCODONE-ACETAMINOPHEN 5-325 MG PO TABS
0.5000 | ORAL_TABLET | Freq: Four times a day (QID) | ORAL | Status: DC | PRN
  Administered 2016-01-27 – 2016-01-29 (×6): 0.5 via ORAL
  Filled 2016-01-26 (×6): qty 1

## 2016-01-26 MED ORDER — TICAGRELOR 90 MG PO TABS
90.0000 mg | ORAL_TABLET | Freq: Two times a day (BID) | ORAL | Status: DC
  Administered 2016-01-26 – 2016-01-29 (×7): 90 mg via ORAL
  Filled 2016-01-26 (×7): qty 1

## 2016-01-26 MED ORDER — ASPIRIN EC 81 MG PO TBEC
81.0000 mg | DELAYED_RELEASE_TABLET | Freq: Every day | ORAL | Status: DC
  Administered 2016-01-27 – 2016-01-29 (×3): 81 mg via ORAL
  Filled 2016-01-26 (×3): qty 1

## 2016-01-26 MED ORDER — ASPIRIN 81 MG PO CHEW
324.0000 mg | CHEWABLE_TABLET | Freq: Once | ORAL | Status: AC
  Administered 2016-01-26: 324 mg via ORAL

## 2016-01-26 MED ORDER — PRAVASTATIN SODIUM 40 MG PO TABS
80.0000 mg | ORAL_TABLET | Freq: Every day | ORAL | Status: DC
  Administered 2016-01-27 – 2016-01-28 (×2): 80 mg via ORAL
  Filled 2016-01-26 (×2): qty 2

## 2016-01-26 MED ORDER — OMEGA-3-ACID ETHYL ESTERS 1 G PO CAPS
1.0000 g | ORAL_CAPSULE | Freq: Three times a day (TID) | ORAL | Status: DC
  Administered 2016-01-26 – 2016-01-29 (×8): 1 g via ORAL
  Filled 2016-01-26 (×8): qty 1

## 2016-01-26 MED ORDER — ALPRAZOLAM 0.25 MG PO TABS
0.2500 mg | ORAL_TABLET | Freq: Three times a day (TID) | ORAL | Status: DC | PRN
  Administered 2016-01-26 – 2016-01-28 (×3): 0.25 mg via ORAL
  Filled 2016-01-26 (×3): qty 1

## 2016-01-26 MED ORDER — CETYLPYRIDINIUM CHLORIDE 0.05 % MT LIQD
7.0000 mL | Freq: Two times a day (BID) | OROMUCOSAL | Status: DC
  Administered 2016-01-27 – 2016-01-29 (×3): 7 mL via OROMUCOSAL

## 2016-01-26 MED ORDER — HEPARIN SODIUM (PORCINE) 5000 UNIT/ML IJ SOLN
60.0000 [IU]/kg | Freq: Once | INTRAMUSCULAR | Status: AC
  Administered 2016-01-26: 4000 [IU] via INTRAVENOUS

## 2016-01-26 MED ORDER — HEPARIN SODIUM (PORCINE) 5000 UNIT/ML IJ SOLN
INTRAMUSCULAR | Status: AC
  Administered 2016-01-26: 4000 [IU] via INTRAVENOUS
  Filled 2016-01-26: qty 1

## 2016-01-26 MED ORDER — PANTOPRAZOLE SODIUM 40 MG PO TBEC
40.0000 mg | DELAYED_RELEASE_TABLET | Freq: Every day | ORAL | Status: DC
  Administered 2016-01-27 – 2016-01-29 (×3): 40 mg via ORAL
  Filled 2016-01-26 (×3): qty 1

## 2016-01-26 MED ORDER — ALLOPURINOL 100 MG PO TABS
100.0000 mg | ORAL_TABLET | Freq: Two times a day (BID) | ORAL | Status: DC
  Administered 2016-01-26 – 2016-01-29 (×6): 100 mg via ORAL
  Filled 2016-01-26 (×6): qty 1

## 2016-01-26 MED ORDER — MORPHINE SULFATE (PF) 4 MG/ML IV SOLN
4.0000 mg | Freq: Four times a day (QID) | INTRAVENOUS | Status: DC | PRN
  Administered 2016-01-26: 4 mg via INTRAVENOUS
  Filled 2016-01-26 (×2): qty 1

## 2016-01-26 MED ORDER — NITROGLYCERIN IN D5W 200-5 MCG/ML-% IV SOLN
0.0000 ug/min | INTRAVENOUS | Status: DC
  Administered 2016-01-26: 20 ug/min via INTRAVENOUS

## 2016-01-26 MED ORDER — VITAMIN D 1000 UNITS PO TABS
1000.0000 [IU] | ORAL_TABLET | Freq: Every day | ORAL | Status: DC
  Administered 2016-01-27 – 2016-01-29 (×3): 1000 [IU] via ORAL
  Filled 2016-01-26 (×3): qty 1

## 2016-01-26 MED ORDER — NITROGLYCERIN 0.4 MG SL SUBL
0.4000 mg | SUBLINGUAL_TABLET | SUBLINGUAL | Status: DC | PRN
  Administered 2016-01-26 (×2): 0.4 mg via SUBLINGUAL
  Filled 2016-01-26: qty 1

## 2016-01-26 NOTE — Progress Notes (Signed)
ANTICOAGULATION CONSULT NOTE - Initial Consult  Pharmacy Consult for Heparin  Indication: chest pain/ACS  Allergies  Allergen Reactions  . Lyrica [Pregabalin]     Gait instability  . Statins Other (See Comments)    Weakness of legs  . Ibuprofen Rash    Patient Measurements: Height: _0  (185.4 cm) Weight: 200 lb (90.719 kg) IBW/kg (Calculated) : 79.9 Heparin Dosing Weight: 90 kg   Vital Signs: Temp: 97.6 F (36.4 C) (01/28 1344) Temp Source: Oral (01/28 1344) BP: 99/60 mmHg (01/28 1415) Pulse Rate: 71 (01/28 1415)  Labs:  Recent Labs  01/26/16 1348 01/26/16 1405 01/26/16 1406  HGB 13.2  --  14.6  HCT 39.2  --  43.0  PLT 179  --   --   APTT  --  27  --   LABPROT  --  13.7  --   INR  --  1.03  --   CREATININE 1.37*  --  1.40*    Estimated Creatinine Clearance: 49.1 mL/min (by C-G formula based on Cr of 1.4).   Medical History: Past Medical History  Diagnosis Date  . IHD (ischemic heart disease)   . Arthritis   . Gout   . Sinus congestion   . GERD (gastroesophageal reflux disease)   . Hypertension   . Hyperlipidemia   . Neuropathy (HCC)     In both legs, below knee  . Depression   . Anxiety disorder   . Cancer [facility])     Prostate  . Gait disorder 03/22/2013  . Peripheral neuropathy (HCC)     Probable  . Coronary artery disease   . Foot drop, bilateral 03/22/2013    Medications:   (Not in a hospital admission)  Assessment: 74 YOM with shortness of breath and sub-sternal chest pain since yesterday afternoon which seems to have improved. Initial troponin elevated. Pharmacy consulted to start IV heparin for ACS. No plans for cath. CBC wnl. He was not on any anticoagulation prior to admission. Of note, he received a dose of heparin IV 4000 units in the ED already.   Goal of Therapy:  Heparin level 0.3-0.7 units/ml Monitor platelets by anticoagulation protocol: Yes   Plan:  -Start heparin infusion at 1200 units/hr -F/u 8 hr HL -Monitor CBC,  daily HL and s/s of bleeding   Albertina Parr, PharmD., BCPS Clinical Pharmacist Pager [phone]

## 2016-01-26 NOTE — ED Notes (Signed)
Dinner tray ordered, heart healthy diet

## 2016-01-26 NOTE — Progress Notes (Addendum)
Patient having 2/10 mid sternal chest pain that does not radiate. EKG done and Nitro titrated to 30 with no relief.Vital signs stable.  MD notified, new order for morphine and to make patient NPO after midnight in case cardiac cath will be done tomorrow. Will continue to monitor.

## 2016-01-26 NOTE — ED Notes (Signed)
[facility] called @ 1348 for [employer]

## 2016-01-26 NOTE — ED Notes (Signed)
Zoll pads applied per Stemi protocol

## 2016-01-26 NOTE — ED Notes (Addendum)
Spoke with Dr. Einar Gip, reported to him pt. Is having chest pain ,1 out of a 10, gave him Nitro SL 0.4 mg and pt. Is pain free. ECG was repeated  Orders received to start Nitro IV and to Titrate up to 20 mg.

## 2016-01-26 NOTE — ED Notes (Signed)
Pt reports onset last night of mid chest pain that he describes as indigestion. ekg done at triage.

## 2016-01-26 NOTE — ED Notes (Signed)
[facility] called @ 1348 for St[facility] 

## 2016-01-26 NOTE — ED Notes (Signed)
Warm blankets given

## 2016-01-26 NOTE — ED Notes (Signed)
ECG repeated and given to Dr;. Everitt Amber

## 2016-01-26 NOTE — ED Notes (Signed)
Belongings given to pt.s wife, she placed them in her car.  Clothing, oxygen tank, wallet, shoes

## 2016-01-26 NOTE — ED Provider Notes (Signed)
CSN: [id number]     Arrival date & time 01/26/16  1334 History   First MD Initiated Contact with Patient 01/26/16 1350     Chief Complaint  Patient presents with  . Chest Pain     (Consider location/radiation/quality/duration/timing/severity/associated sxs/prior Treatment) Patient is a 81 y.o. male presenting with chest pain.  Chest Pain Associated symptoms: shortness of breath   Associated symptoms: no abdominal pain, no back pain, no diaphoresis, no fever, no headache, no nausea and not vomiting    patient with onset of substernal chest pain at 1:00 in the afternoon yesterday. Associated with some shortness of breath no diaphoresis no nausea or vomiting. Patient is status post CABG and cardiac cath in 2005. Patient has not had any chest pain since then. Patient's cardiologist is Dr. Mare Ferrari. Patient states that the pain at its worse was 8 out of 10 and it's currently 3 out of 10. Patient is on Plavix. Patient has nitroglycerin at home but did not use any of it.  Past Medical History  Diagnosis Date  . IHD (ischemic heart disease)   . Arthritis   . Gout   . Sinus congestion   . GERD (gastroesophageal reflux disease)   . Hypertension   . Hyperlipidemia   . Neuropathy (HCC)     In both legs, below knee  . Depression   . Anxiety disorder   . Cancer [facility])     Prostate  . Gait disorder 03/22/2013  . Peripheral neuropathy (HCC)     Probable  . Coronary artery disease   . Foot drop, bilateral 03/22/2013   Past Surgical History  Procedure Laterality Date  . Cardiac catheterization  09/24/2004    EF 60%  . Coronary artery bypass graft  2004  . US echocardiography  01/19/2008    EF 55-60%  . Cardiovascular stress test  07/17/2010    EF 61%  . Radium seed implant      Prostate cancer  . Tonsillectomy    . Hemorrhoid surgery    . Cataract extraction, bilateral     Family History  Problem Relation Age of Onset  . Alzheimer's disease Mother 34  . Coronary artery disease Father    . Prostate cancer Father    Social History  Substance Use Topics  . Smoking status: Former Smoker -- 3.00 packs/day for 25 years    Types: Cigarettes    Quit date: 08/01/1991  . Smokeless tobacco: Never Used  . Alcohol Use: No    Review of Systems  Constitutional: Negative for fever and diaphoresis.  HENT: Negative for congestion.   Eyes: Negative for visual disturbance.  Respiratory: Positive for shortness of breath.   Cardiovascular: Positive for chest pain.  Gastrointestinal: Negative for nausea, vomiting and abdominal pain.  Genitourinary: Negative for dysuria.  Musculoskeletal: Negative for back pain.  Neurological: Negative for headaches.  Hematological: Does not bruise/bleed easily.  Psychiatric/Behavioral: Negative for confusion.      Allergies  Lyrica; Statins; and Ibuprofen  Home Medications   Prior to Admission medications   Medication Sig Start Date End Date Taking? Authorizing Provider  allopurinol (ZYLOPRIM) 100 MG tablet Take 1 tablet (100 mg total) by mouth 2 (two) times daily. 02/21/13   Darlin Coco, MD  ALPRAZolam Duanne Moron) 0.5 MG tablet Take 0.5 mg by mouth 3 (three) times daily as needed for anxiety (anxiety).    Historical Provider, MD  aspirin 81 MG EC tablet Take 81 mg by mouth daily.      Historical  Provider, MD  B Complex-C (SUPER B COMPLEX PO) Take 1 tablet by mouth daily.    Historical Provider, MD  cholecalciferol (VITAMIN D) 1000 UNITS tablet Take 1,000 Units by mouth daily.    Historical Provider, MD  clopidogrel (PLAVIX) 75 MG tablet Take 75 mg by mouth at bedtime.     Historical Provider, MD  furosemide (LASIX) 20 MG tablet Take 1 tablet (20 mg total) by mouth daily. 01/01/16   Darlin Coco, MD  HYDROcodone-acetaminophen (NORCO/VICODIN) 5-325 MG per tablet Take 5-325 tablets by mouth every 6 (six) hours as needed (pain).  09/20/13   Historical Provider, MD  lovastatin (MEVACOR) 10 MG tablet Take 10 mg by mouth daily. 09/06/13   Historical  Provider, MD  Misc Natural Products (OSTEO BI-FLEX JOINT SHIELD PO) Take 1 tablet by mouth 2 (two) times daily.     Historical Provider, MD  nitroGLYCERIN (NITROSTAT) 0.4 MG SL tablet Place 0.4 mg under the tongue every 5 (five) minutes as needed. For chest pain    Darlin Coco, MD  omega-3 acid ethyl esters (LOVAZA) 1 G capsule Take 1 g by mouth 3 (three) times daily.    Historical Provider, MD  omeprazole (PRILOSEC) 20 MG capsule Take 20 mg by mouth 2 (two) times daily.    Historical Provider, MD  potassium chloride SA (K-DUR,KLOR-CON) 20 MEQ tablet TAKE 1 TABLET BY MOUTH DAILY 11/06/15   Darlin Coco, MD  Red Yeast Rice 600 MG CAPS Take 600 mg by mouth 2 (two) times daily.    Historical Provider, MD  valsartan-hydrochlorothiazide (DIOVAN HCT) 160-12.5 MG per tablet Take 1 tablet by mouth daily. 12/16/13   Darlin Coco, MD   BP 114/61 mmHg  Pulse 65  Temp(Src) 97.6 F (36.4 C) (Oral)  Resp 20  Ht _0  (1.854 m)  Wt 90.719 kg  BMI 26.39 kg/m2  SpO2 97% Physical Exam  Constitutional: He is oriented to person, place, and time. He appears well-developed and well-nourished. No distress.  HENT:  Head: Normocephalic and atraumatic.  Mouth/Throat: Oropharynx is clear and moist.  Eyes: Conjunctivae and EOM are normal. Pupils are equal, round, and reactive to light.  Neck: Normal range of motion.  Cardiovascular: Normal rate, regular rhythm and normal heart sounds.   No murmur heard. Pulmonary/Chest: Effort normal and breath sounds normal. No respiratory distress.  Abdominal: Soft. Bowel sounds are normal. There is no tenderness.  Musculoskeletal: Normal range of motion.  Neurological: He is alert and oriented to person, place, and time. No cranial nerve deficit. He exhibits normal muscle tone. Coordination normal.  Skin: Skin is warm.  Nursing note and vitals reviewed.   ED Course  Procedures (including critical care time) Labs Review Labs Reviewed  BASIC METABOLIC PANEL -  Abnormal; Notable for the following:    Sodium 134 (*)    Chloride 97 (*)    Glucose, Bld 102 (*)    BUN 39 (*)    Creatinine, Ser 1.37 (*)    GFR calc non Af Amer 48 (*)    GFR calc Af Amer 55 (*)    All other components within normal limits  I-STAT TROPOININ, ED - Abnormal; Notable for the following:    Troponin i, poc 3.24 (*)    All other components within normal limits  I-STAT CHEM 8, ED - Abnormal; Notable for the following:    Chloride 98 (*)    BUN 45 (*)    Creatinine, Ser 1.40 (*)    All other components within  normal limits  CBC  PROTIME-INR  APTT  I-STAT TROPOININ, ED   Results for orders placed or performed during the hospital encounter of 73/22/02  Basic metabolic panel  Result Value Ref Range   Sodium 134 (L) 135 - 145 mmol/L   Potassium 4.6 3.5 - 5.1 mmol/L   Chloride 97 (L) 101 - 111 mmol/L   CO2 24 22 - 32 mmol/L   Glucose, Bld 102 (H) 65 - 99 mg/dL   BUN 39 (H) 6 - 20 mg/dL   Creatinine, Ser 1.37 (H) 0.61 - 1.24 mg/dL   Calcium 9.4 8.9 - 10.3 mg/dL   GFR calc non Af Amer 48 (L) >60 mL/min   GFR calc Af Amer 55 (L) >60 mL/min   Anion gap 13 5 - 15  CBC  Result Value Ref Range   WBC 6.7 4.0 - 10.5 K/uL   RBC 4.31 4.22 - 5.81 MIL/uL   Hemoglobin 13.2 13.0 - 17.0 g/dL   HCT 39.2 39.0 - 52.0 %   MCV 91.0 78.0 - 100.0 fL   MCH 30.6 26.0 - 34.0 pg   MCHC 33.7 30.0 - 36.0 g/dL   RDW 13.3 11.5 - 15.5 %   Platelets 179 150 - 400 K/uL  I-stat troponin, ED (not at [facility])  Result Value Ref Range   Troponin i, poc 3.24 (HH) 0.00 - 0.08 ng/mL   Comment NOTIFIED PHYSICIAN    Comment 3          I-Stat Chem 8, ED  Result Value Ref Range   Sodium 136 135 - 145 mmol/L   Potassium 4.3 3.5 - 5.1 mmol/L   Chloride 98 (L) 101 - 111 mmol/L   BUN 45 (H) 6 - 20 mg/dL   Creatinine, Ser 1.40 (H) 0.61 - 1.24 mg/dL   Glucose, Bld 98 65 - 99 mg/dL   Calcium, Ion 1.19 1.13 - 1.30 mmol/L   TCO2 28 0 - 100 mmol/L   Hemoglobin 14.6 13.0 - 17.0 g/dL   HCT 43.0 39.0 -  52.0 %   Imaging Review Dg Chest Portable 1 View  01/26/2016  CLINICAL DATA:  Hypertension and coronary artery disease. Follow-up. EXAM: PORTABLE CHEST 1 VIEW COMPARISON:  01/14/2016 FINDINGS: Previous median sternotomy and CABG. Heart size is normal. The aorta is unfolded. There is chronic scarring of both lung bases. No sign of active infiltrate, effusion or collapse. No edema. IMPRESSION: No active disease.  Previous CABG.  Basilar pulmonary scarring. Electronically Signed   By: Nelson Chimes M.D.   On: 01/26/2016 14:11   I have personally reviewed and evaluated these images and lab results as part of my medical decision-making.   EKG Interpretation   Date/Time:  Saturday January 26 2016 13:39:40 EST Ventricular Rate:  66 PR Interval:  168 QRS Duration: 90 QT Interval:  424 QTC Calculation: 444 R Axis:     Text Interpretation:  Critical Test Result: STEMI Normal sinus rhythm  Left ventricular hypertrophy with repolarization abnormality  Inferior-posterior infarct , possibly acute  ACUTE MI / STEMI  Consider right ventricular involvement in acute inferior infarct Abnormal  ECG Confirmed by Marlies Ligman  MD, Tenleigh Byer ([id number]) on 01/26/2016 1:47:36 PM   CRITICAL CARE Performed by: Fredia Sorrow Total critical care time: 30 minutes Critical care time was exclusive of separately billable procedures and treating other patients. Critical care was necessary to treat or prevent imminent or life-threatening deterioration. Critical care was time spent personally by me on the following activities: development of treatment  plan with patient and/or surrogate as well as nursing, discussions with consultants, evaluation of patient's response to treatment, examination of patient, obtaining history from patient or surrogate, ordering and performing treatments and interventions, ordering and review of laboratory studies, ordering and review of radiographic studies, pulse oximetry and re-evaluation of patient's  condition.     MDM   Final diagnoses:  ST elevation myocardial infarction (STEMI), unspecified artery (HCC)    EKG concerning for inferior posterior type MI. Patient with onset of chest pain at 1:00 in the afternoon. Spink constant substernal. Patient status post cardiac cath and bypass surgery and not 2005. Patient without any real pain problems since then. Associated with shortness of breath no nausea no vomiting. Initial troponin is elevated. A she was called as a code STEMI upon reading the EKG. Patient will go to the cardiac Cath Lab. Discuss with cardiology they have reviewed the EKGs and agree with the diagnosis.  Patient started on heparin. Patient given sublingual nitroglycerin and aspirin. Chest x-ray is pending    Fredia Sorrow, MD 01/26/16 1420

## 2016-01-26 NOTE — Progress Notes (Signed)
PHARMACIST - PHYSICIAN ORDER COMMUNICATION  CONCERNING: P&T Medication Policy on Herbal Medications  DESCRIPTION:  This patient's order for:  Red yeast rice  has been noted.  This product(s) is classified as an "herbal" or natural product. Due to a lack of definitive safety studies or FDA approval, nonstandard manufacturing practices, plus the potential risk of unknown drug-drug interactions while on inpatient medications, the Pharmacy and Therapeutics Committee does not permit the use of "herbal" or natural products of this type within [facility].    ACTION TAKEN: The pharmacy department is unable to verify this order at this time and your patient has been informed of this safety policy. Please reevaluate patient's clinical condition at discharge and address if the herbal or natural product(s) should be resumed at that time.

## 2016-01-26 NOTE — Progress Notes (Signed)
   01/26/16 1800  Clinical Encounter Type  Visited With Patient and family together;Health care provider  Visit Type Initial;Psychological support;Spiritual support;Social support;Critical Care;ED  Referral From Nurse;Care management  Consult/Referral To [facility] was paged for a code stemi _0 :35pm. Chaplain stopped by to be of support to Pt. And Wife. Both Pt. And wife said that "they were fine" and they didn't need any aid from spiritual care at this time. Chaplain will keep checking in with Pt. And wife throughout their stay at [facility],  just to make sure, both individuals are getting good care and support.

## 2016-01-26 NOTE — ED Notes (Addendum)
Pt. Reported pt. Having chest pain 1/10 , Repeated his ECG and gave pt. 1 Nitro SL0.4 mg.  Paged Dr. Einar Gip

## 2016-01-26 NOTE — H&P (Signed)
Steven Cameron is an 81 y.o. male.   Chief Complaint: Chest pain HPI: Steven Cameron  is a 81 y.o. male  With history of known coronary artery disease, history of CABG in 2004, hyperlipidemia, intolerant to statins presently on low-dose of lovastatin and also red yeast rice, history of peripheral neuropathy, being worked up for etiology, hypertension and hyperlipidemia and anxiety disorder who was doing well until last evening after having late lunch started having chest discomfort. Initially was mild but persisted through the night. This morning after he woke up, his wife forced him to come to the hospital due to persistent chest discomfort. On presentation to the hospital in the emergency room was found to have significant ST segment depression in the anterior leads with ST elevation in the inferior leads, STEMI was activated. Patient was administered IV heparin 4000 units along with aspirin and sublingual nitroglycerin. Chest pain immediately resolved and ST segments changes were back to baseline with very minimal ST depression in the anterior lead especially V1 and V2. Essentially his EKG was back to his original baseline.  He denies any symptoms of claudication, he states that both his lower exudates are very weak and also has edema of the legs that is chronic and takes furosemide for the same. He has not had any recent cardiac testing. Wife present the bedside. Denies any recent worsening dyspnea, PND or orthopnea. Denies any symptoms of neurologic deficits. No recent weight changes.  No history of tobacco use disorder. Quit tobacco many years ago and does not drink alcohol or use any illicit drugs.   Past Medical History  Diagnosis Date  . IHD (ischemic heart disease)   . Arthritis   . Gout   . Sinus congestion   . GERD (gastroesophageal reflux disease)   . Hypertension   . Hyperlipidemia   . Neuropathy (HCC)     In both legs, below knee  . Depression   . Anxiety disorder   . Cancer  [facility])     Prostate  . Gait disorder 03/22/2013  . Peripheral neuropathy (HCC)     Probable  . Coronary artery disease   . Foot drop, bilateral 03/22/2013    Past Surgical History  Procedure Laterality Date  . Cardiac catheterization  09/24/2004    EF 60%  . Coronary artery bypass graft  2004  . US echocardiography  01/19/2008    EF 55-60%  . Cardiovascular stress test  07/17/2010    EF 61%  . Radium seed implant      Prostate cancer  . Tonsillectomy    . Hemorrhoid surgery    . Cataract extraction, bilateral      Family History  Problem Relation Age of Onset  . Alzheimer's disease Mother 7  . Coronary artery disease Father   . Prostate cancer Father    Social History:  reports that he quit smoking about 24 years ago. His smoking use included Cigarettes. He has a 75 pack-year smoking history. He has never used smokeless tobacco. He reports that he does not drink alcohol or use illicit drugs.  Allergies:  Allergies  Allergen Reactions  . Lyrica [Pregabalin]     Gait instability  . Statins Other (See Comments)    Weakness of legs  . Ibuprofen Rash    Review of Systems - denies any fever, shortness of breath, did feel mildly nauseous, no neurologic deficit except for bilateral leg pain and states that he has neuropathy. Difficulty in ambulating due to pain  in his legs and weakness. He is not a diabetic.  Blood pressure 99/60, pulse 71, temperature 97.6 F (36.4 C), temperature source Oral, resp. rate 13, height _0  (1.854 m), weight 90.719 kg (200 lb), SpO2 93 %. General appearance: alert, cooperative, appears stated age and no distress Eyes: negative findings: lids and lashes normal Neck: no adenopathy, no JVD, supple, symmetrical, trachea midline, thyroid not enlarged, symmetric, no tenderness/mass/nodules and Bilateral carotid artery bruit present Neck: JVP - normal, carotids 2+= without bruits Resp: Faint crackles in the right base, otherwise clear to  auscultation. Chest wall: no tenderness Cardio: S1, S2 normal and 2/6 systolic ejection murmur in the right sternal border, no gallop appreciated. No rub. GI: soft, non-tender; bowel sounds normal; no masses,  no organomegaly Extremities: edema 2 plus bilateral below knee and pitting and no ulcers, gangrene or trophic changes Pulses: Bilateral dominant carotid artery bruit present. Femoral pulses 2+. Popliteal pulses 2+. Right posterior tibial and dorsalis pedis 2+, left faint. Skin: Skin color, texture, turgor normal. No rashes or lesions Neurologic: Grossly normal  Results for orders placed or performed during the hospital encounter of 01/26/16 (from the past 48 hour(s))  Basic metabolic panel     Status: Abnormal   Collection Time: 01/26/16  1:48 PM  Result Value Ref Range   Sodium 134 (L) 135 - 145 mmol/L   Potassium 4.6 3.5 - 5.1 mmol/L   Chloride 97 (L) 101 - 111 mmol/L   CO2 24 22 - 32 mmol/L   Glucose, Bld 102 (H) 65 - 99 mg/dL   BUN 39 (H) 6 - 20 mg/dL   Creatinine, Ser 1.37 (H) 0.61 - 1.24 mg/dL   Calcium 9.4 8.9 - 10.3 mg/dL   GFR calc non Af Amer 48 (L) >60 mL/min   GFR calc Af Amer 55 (L) >60 mL/min    Comment: (NOTE) The eGFR has been calculated using the CKD EPI equation. This calculation has not been validated in all clinical situations. eGFR's persistently <60 mL/min signify possible Chronic Kidney Disease.    Anion gap 13 5 - 15  CBC     Status: None   Collection Time: 01/26/16  1:48 PM  Result Value Ref Range   WBC 6.7 4.0 - 10.5 K/uL   RBC 4.31 4.22 - 5.81 MIL/uL   Hemoglobin 13.2 13.0 - 17.0 g/dL   HCT 39.2 39.0 - 52.0 %   MCV 91.0 78.0 - 100.0 fL   MCH 30.6 26.0 - 34.0 pg   MCHC 33.7 30.0 - 36.0 g/dL   RDW 13.3 11.5 - 15.5 %   Platelets 179 150 - 400 K/uL  I-stat troponin, ED (not at [facility], [facility])     Status: Abnormal   Collection Time: 01/26/16  1:50 PM  Result Value Ref Range   Troponin i, poc 3.24 (HH) 0.00 - 0.08 ng/mL   Comment NOTIFIED PHYSICIAN     Comment 3            Comment: Due to the release kinetics of cTnI, a negative result within the first hours of the onset of symptoms does not rule out myocardial infarction with certainty. If myocardial infarction is still suspected, repeat the test at appropriate intervals.   Protime-INR     Status: None   Collection Time: 01/26/16  2:05 PM  Result Value Ref Range   Prothrombin Time 13.7 11.6 - 15.2 seconds   INR 1.03 0.00 - 1.49  APTT     Status: None  Collection Time: 01/26/16  2:05 PM  Result Value Ref Range   aPTT 27 24 - 37 seconds  I-Stat Chem 8, ED     Status: Abnormal   Collection Time: 01/26/16  2:06 PM  Result Value Ref Range   Sodium 136 135 - 145 mmol/L   Potassium 4.3 3.5 - 5.1 mmol/L   Chloride 98 (L) 101 - 111 mmol/L   BUN 45 (H) 6 - 20 mg/dL   Creatinine, Ser 1.40 (H) 0.61 - 1.24 mg/dL   Glucose, Bld 98 65 - 99 mg/dL   Calcium, Ion 1.19 1.13 - 1.30 mmol/L   TCO2 28 0 - 100 mmol/L   Hemoglobin 14.6 13.0 - 17.0 g/dL   HCT 43.0 39.0 - 52.0 %   Dg Chest Portable 1 View  01/26/2016  CLINICAL DATA:  Hypertension and coronary artery disease. Follow-up. EXAM: PORTABLE CHEST 1 VIEW COMPARISON:  01/14/2016 FINDINGS: Previous median sternotomy and CABG. Heart size is normal. The aorta is unfolded. There is chronic scarring of both lung bases. No sign of active infiltrate, effusion or collapse. No edema. IMPRESSION: No active disease.  Previous CABG.  Basilar pulmonary scarring. Electronically Signed   By: Nelson Chimes M.D.   On: 01/26/2016 14:11    Labs:   Lab Results  Component Value Date   WBC 6.7 01/26/2016   HGB 14.6 01/26/2016   HCT 43.0 01/26/2016   MCV 91.0 01/26/2016   PLT 179 01/26/2016    Recent Labs Lab 01/26/16 1348 01/26/16 1406  NA 134* 136  K 4.6 4.3  CL 97* 98*  CO2 24  --   BUN 39* 45*  CREATININE 1.37* 1.40*  CALCIUM 9.4  --   GLUCOSE 102* 98    Lipid Panel     Component Value Date/Time   CHOL 148 10/04/2015 0913   TRIG 119  10/04/2015 0913   HDL 35* 10/04/2015 0913   CHOLHDL 4.2 10/04/2015 0913   VLDL 24 10/04/2015 0913   LDLCALC 89 10/04/2015 0913    BNP (last 3 results)  Recent Labs  11/08/15 1714  BNP 125.4*    HEMOGLOBIN A1C No results found for: HGBA1C, MPG  Cardiac Panel (last 3 results)  Recent Labs  11/09/15 0005 11/09/15 0230 11/09/15 0441  TROPONINI 0.12* 0.12* 0.11*    Lab Results  Component Value Date   TROPONINI 0.11* 11/09/2015     TSH No results for input(s): TSH in the last 8760 hours.  EKG:  Admission EKG revealing normal sinus rhythm with 1 mm ST elevation in the inferior lead with 3 mm downsloping ST segment depression in V1 to V4, suggestive of acute injury pattern involving inferior wall and posterior wall. Repeat EKG reveals normalization of ST segment changes with very mild T-wave inversion in V2 with minimal ST depression in V2.  Current facility-administered medications:  .  aspirin 81 MG chewable tablet, , , ,  .  nitroGLYCERIN (NITROSTAT) SL tablet 0.4 mg, 0.4 mg, Sublingual, Q5 min PRN, Fredia Sorrow, MD, 0.4 mg at 01/26/16 1406  Current outpatient prescriptions:  .  allopurinol (ZYLOPRIM) 100 MG tablet, Take 1 tablet (100 mg total) by mouth 2 (two) times daily., Disp: 60 tablet, Rfl: 11 .  ALPRAZolam (XANAX) 0.5 MG tablet, Take 0.5 mg by mouth 3 (three) times daily as needed for anxiety (anxiety)., Disp: , Rfl:  .  aspirin 81 MG EC tablet, Take 81 mg by mouth daily.  , Disp: , Rfl:  .  B Complex-C (SUPER B  COMPLEX PO), Take 1 tablet by mouth daily., Disp: , Rfl:  .  cholecalciferol (VITAMIN D) 1000 UNITS tablet, Take 1,000 Units by mouth daily., Disp: , Rfl:  .  clopidogrel (PLAVIX) 75 MG tablet, Take 75 mg by mouth at bedtime. , Disp: , Rfl:  .  furosemide (LASIX) 20 MG tablet, Take 1 tablet (20 mg total) by mouth daily., Disp: 90 tablet, Rfl: 2 .  HYDROcodone-acetaminophen (NORCO/VICODIN) 5-325 MG per tablet, Take 5-325 tablets by mouth every 6 (six)  hours as needed (pain). , Disp: , Rfl:  .  lovastatin (MEVACOR) 10 MG tablet, Take 10 mg by mouth daily., Disp: , Rfl:  .  Misc Natural Products (OSTEO BI-FLEX JOINT SHIELD PO), Take 1 tablet by mouth 2 (two) times daily. , Disp: , Rfl:  .  nitroGLYCERIN (NITROSTAT) 0.4 MG SL tablet, Place 0.4 mg under the tongue every 5 (five) minutes as needed. For chest pain, Disp: , Rfl:  .  omega-3 acid ethyl esters (LOVAZA) 1 G capsule, Take 1 g by mouth 3 (three) times daily., Disp: , Rfl:  .  omeprazole (PRILOSEC) 20 MG capsule, Take 20 mg by mouth 2 (two) times daily., Disp: , Rfl:  .  potassium chloride SA (K-DUR,KLOR-CON) 20 MEQ tablet, TAKE 1 TABLET BY MOUTH DAILY, Disp: 30 tablet, Rfl: 10 .  Red Yeast Rice 600 MG CAPS, Take 600 mg by mouth 2 (two) times daily., Disp: , Rfl:  .  valsartan-hydrochlorothiazide (DIOVAN HCT) 160-12.5 MG per tablet, Take 1 tablet by mouth daily., Disp: 90 tablet, Rfl: 3  Assessment/Plan 1. Acute coronary syndrome/NSTEMI inferior and posterior wall. 2. CAD S/P CABG 10/16/2003 with eft internal mammary artery to the left anterior descending, a saphenous vein graft to the diagonal #1, a saphenous vein graft sequentially to the ramusintermedius and obtuse marginal #1, and finally a saphenous vein graft tothe right coronary artery. 3. Hypertension 4. Hyperlipidemia 5. Peripheral neuropathy 6. Probably mild acute renal insufficiency probably maybe due to dehydration, serum creatinine 1.3. Stage III chronic kidney disease probably due to hypertension. 7. Bilateral carotid artery bruit, except for mildly reduced left pedal pulses vascular exam is normal.  Recommendation: Discussed with Dr. Sherren Mocha, patient essentially asymptomatic with resolution of ST segment changes, due to renal insufficiency and the fact that he is asymptomatic will admit the patient in stepdown unit and hydrate the patient well. I will hold off on diuretics and also hold off on ARB until his  cardiac catheterization is complete. Patient unable to tolerate high dose of statins per history, was on lovastatin 10 mg, has switched to pravastatin 80 mg for now. We'll start the patient on IV heparin, discontinue Plavix and switch him to [facility] for now due to ACS. Further recommendations will be as per Dr. Burt Knack who plans to perform coronary angiography on Monday 01/28/2016.  Adrian Prows, MD 01/26/2016, 2:28 PM [facility]. [place] Pager: [phone] Office: [phone] If no answer: Cell:  [phone]

## 2016-01-26 NOTE — ED Notes (Signed)
Pt. 's last meal was noon.

## 2016-01-27 ENCOUNTER — Encounter (HOSPITAL_COMMUNITY)
Admission: EM | Disposition: A | Discharge status: Home / Self Care | Payer: Medicare Other | Source: Home / Self Care | Attending: Cardiovascular Disease

## 2016-01-27 ENCOUNTER — Ambulatory Visit (HOSPITAL_COMMUNITY): Admit: 2016-01-27 | Payer: Self-pay | Admitting: Cardiology

## 2016-01-27 DIAGNOSIS — I214 Non-ST elevation (NSTEMI) myocardial infarction: Secondary | ICD-10-CM

## 2016-01-27 DIAGNOSIS — I119 Hypertensive heart disease without heart failure: Secondary | ICD-10-CM

## 2016-01-27 DIAGNOSIS — E785 Hyperlipidemia, unspecified: Secondary | ICD-10-CM

## 2016-01-27 DIAGNOSIS — N179 Acute kidney failure, unspecified: Secondary | ICD-10-CM

## 2016-01-27 DIAGNOSIS — I25710 Atherosclerosis of autologous vein coronary artery bypass graft(s) with unstable angina pectoris: Secondary | ICD-10-CM

## 2016-01-27 HISTORY — PX: CARDIAC CATHETERIZATION: SHX172

## 2016-01-27 LAB — BASIC METABOLIC PANEL
Anion gap: 12 (ref 5–15)
BUN: 32 mg/dL — AB (ref 6–20)
CALCIUM: 9 mg/dL (ref 8.9–10.3)
CHLORIDE: 100 mmol/L — AB (ref 101–111)
CO2: 24 mmol/L (ref 22–32)
CREATININE: 1.07 mg/dL (ref 0.61–1.24)
Glucose, Bld: 103 mg/dL — ABNORMAL HIGH (ref 65–99)
Potassium: 4.3 mmol/L (ref 3.5–5.1)
SODIUM: 136 mmol/L (ref 135–145)

## 2016-01-27 LAB — CBC
HCT: 33.9 % — ABNORMAL LOW (ref 39.0–52.0)
HEMOGLOBIN: 11.6 g/dL — AB (ref 13.0–17.0)
MCH: 30.6 pg (ref 26.0–34.0)
MCHC: 34.2 g/dL (ref 30.0–36.0)
MCV: 89.4 fL (ref 78.0–100.0)
Platelets: 178 10*3/uL (ref 150–400)
RBC: 3.79 MIL/uL — AB (ref 4.22–5.81)
RDW: 13.4 % (ref 11.5–15.5)
WBC: 7.9 10*3/uL (ref 4.0–10.5)

## 2016-01-27 LAB — POCT ACTIVATED CLOTTING TIME
Activated Clotting Time: 477 seconds
Activated Clotting Time: 173 seconds

## 2016-01-27 LAB — TROPONIN I: TROPONIN I: 9.76 ng/mL — AB (ref <0.031)

## 2016-01-27 LAB — HEPARIN LEVEL (UNFRACTIONATED)
HEPARIN UNFRACTIONATED: 0.3 [IU]/mL (ref 0.30–0.70)
Heparin Unfractionated: 0.25 IU/mL — ABNORMAL LOW (ref 0.30–0.70)

## 2016-01-27 SURGERY — LEFT HEART CATH AND CORS/GRAFTS ANGIOGRAPHY

## 2016-01-27 MED ORDER — LIDOCAINE HCL (PF) 1 % IJ SOLN
INTRAMUSCULAR | Status: DC | PRN
Start: 2016-01-27 — End: 2016-01-27
  Administered 2016-01-27: 15 mL via SUBCUTANEOUS
  Administered 2016-01-27: 2 mL via SUBCUTANEOUS

## 2016-01-27 MED ORDER — HEPARIN SODIUM (PORCINE) 1000 UNIT/ML IJ SOLN
INTRAMUSCULAR | Status: DC | PRN
  Administered 2016-01-27: 6500 [IU] via INTRAVENOUS

## 2016-01-27 MED ORDER — FENTANYL CITRATE (PF) 100 MCG/2ML IJ SOLN
INTRAMUSCULAR | Status: AC
  Filled 2016-01-27: qty 2

## 2016-01-27 MED ORDER — HEPARIN (PORCINE) IN NACL 2-0.9 UNIT/ML-% IJ SOLN
INTRAMUSCULAR | Status: AC
  Filled 2016-01-27: qty 1000

## 2016-01-27 MED ORDER — SODIUM CHLORIDE 0.9 % IV SOLN
INTRAVENOUS | Status: DC
Start: 2016-01-27 — End: 2016-01-27
  Administered 2016-01-27 (×3): via INTRAVENOUS

## 2016-01-27 MED ORDER — SODIUM CHLORIDE 0.9% FLUSH
3.0000 mL | Freq: Two times a day (BID) | INTRAVENOUS | Status: DC
  Administered 2016-01-28 (×2): 3 mL via INTRAVENOUS

## 2016-01-27 MED ORDER — SODIUM CHLORIDE 0.9 % IV SOLN
250.0000 mg | INTRAVENOUS | Status: DC | PRN
  Administered 2016-01-27: 1.75 mg/kg/h via INTRAVENOUS

## 2016-01-27 MED ORDER — ASPIRIN 81 MG PO CHEW: 81.0000 mg | CHEWABLE_TABLET | ORAL | Status: DC

## 2016-01-27 MED ORDER — MIDAZOLAM HCL 2 MG/2ML IJ SOLN
INTRAMUSCULAR | Status: AC
  Filled 2016-01-27: qty 2

## 2016-01-27 MED ORDER — ATROPINE SULFATE 0.1 MG/ML IJ SOLN
INTRAMUSCULAR | Status: AC
  Filled 2016-01-27: qty 10

## 2016-01-27 MED ORDER — BIVALIRUDIN BOLUS VIA INFUSION - CUPID
INTRAVENOUS | Status: DC | PRN
  Administered 2016-01-27: 66.15 mg via INTRAVENOUS

## 2016-01-27 MED ORDER — SODIUM CHLORIDE 0.9% FLUSH
3.0000 mL | Freq: Two times a day (BID) | INTRAVENOUS | Status: DC
  Administered 2016-01-27: 3 mL via INTRAVENOUS

## 2016-01-27 MED ORDER — VERAPAMIL HCL 2.5 MG/ML IV SOLN
INTRAVENOUS | Status: DC | PRN
  Administered 2016-01-27: 10 mL via INTRA_ARTERIAL

## 2016-01-27 MED ORDER — SODIUM CHLORIDE 0.9 % IV SOLN: 250.0000 mL | INTRAVENOUS | Status: DC | PRN

## 2016-01-27 MED ORDER — FENTANYL CITRATE (PF) 100 MCG/2ML IJ SOLN
INTRAMUSCULAR | Status: DC | PRN
  Administered 2016-01-27 (×3): 25 ug via INTRAVENOUS

## 2016-01-27 MED ORDER — VERAPAMIL HCL 2.5 MG/ML IV SOLN
INTRAVENOUS | Status: AC
  Filled 2016-01-27: qty 2

## 2016-01-27 MED ORDER — SODIUM CHLORIDE 0.9% FLUSH: 3.0000 mL | INTRAVENOUS | Status: DC | PRN

## 2016-01-27 MED ORDER — BIVALIRUDIN 250 MG IV SOLR
INTRAVENOUS | Status: AC
  Filled 2016-01-27: qty 250

## 2016-01-27 MED ORDER — IOHEXOL 350 MG/ML SOLN
INTRAVENOUS | Status: DC | PRN
Start: 2016-01-27 — End: 2016-01-27
  Administered 2016-01-27: 125 mL via INTRAVENOUS
  Administered 2016-01-27: 100 mL
  Administered 2016-01-27: 50 mL

## 2016-01-27 MED ORDER — SODIUM CHLORIDE 0.9 % WEIGHT BASED INFUSION
1.0000 mL/kg/h | INTRAVENOUS | Status: AC
  Administered 2016-01-27: 1 mL/kg/h via INTRAVENOUS

## 2016-01-27 MED ORDER — MIDAZOLAM HCL 2 MG/2ML IJ SOLN
INTRAMUSCULAR | Status: DC | PRN
Start: 2016-01-27 — End: 2016-01-27
  Administered 2016-01-27: 1 mg via INTRAVENOUS

## 2016-01-27 MED ORDER — LIDOCAINE HCL (PF) 1 % IJ SOLN
INTRAMUSCULAR | Status: AC
  Filled 2016-01-27: qty 30

## 2016-01-27 SURGICAL SUPPLY — 26 items
BALLN EMERGE MR 2.5X15 (BALLOONS) ×3
BALLOON EMERGE MR 2.5X15 (BALLOONS) ×1 IMPLANT
CATH INFINITI 5 FR 3DRC (CATHETERS) ×3 IMPLANT
CATH INFINITI 5 FR IM (CATHETERS) ×3 IMPLANT
CATH INFINITI 5 FR RCB (CATHETERS) ×3 IMPLANT
CATH INFINITI 5FR AL1 (CATHETERS) ×3 IMPLANT
CATH INFINITI 5FR MULTPACK ANG (CATHETERS) ×3 IMPLANT
CATH LAUNCHER 5F RADL (CATHETERS) ×1 IMPLANT
CATHETER LAUNCHER 5F RADL (CATHETERS) ×3
DEVICE RAD COMP TR BAND LRG (VASCULAR PRODUCTS) ×3 IMPLANT
ELECT DEFIB PAD ADLT CADENCE (PAD) ×3 IMPLANT
GLIDESHEATH SLEND A-KIT 6F 22G (SHEATH) ×3 IMPLANT
GUIDE CATH RUNWAY 6FR AL 1 (CATHETERS) ×3 IMPLANT
KIT ENCORE 26 ADVANTAGE (KITS) ×3 IMPLANT
KIT HEART LEFT (KITS) ×3 IMPLANT
PACK CARDIAC CATHETERIZATION (CUSTOM PROCEDURE TRAY) ×3 IMPLANT
SHEATH PINNACLE 5F 10CM (SHEATH) ×3 IMPLANT
SHEATH PINNACLE 6F 10CM (SHEATH) ×3 IMPLANT
STENT PROMUS PREM MR 3.5X12 (Permanent Stent) ×3 IMPLANT
STENT PROMUS PREM MR 3.5X32 (Permanent Stent) ×3 IMPLANT
SYR MEDRAD MARK V 150ML (SYRINGE) ×3 IMPLANT
TRANSDUCER W/STOPCOCK (MISCELLANEOUS) ×3 IMPLANT
TUBING CIL FLEX 10 FLL-RA (TUBING) ×3 IMPLANT
WIRE HI TORQ BMW 190CM (WIRE) ×3 IMPLANT
WIRE PT2 MS 185 (WIRE) ×3 IMPLANT
WIRE SAFE-T 1.5MM-J .035X260CM (WIRE) ×3 IMPLANT

## 2016-01-27 NOTE — Progress Notes (Signed)
Patient still having chest pain, now 3/10. No relief with 13m Morphine or Nitro at 310m. Cannot increase Nitro due to soft BP. Oxygen increased to 4L. MD notified, no new orders. Will continue to monitor.

## 2016-01-27 NOTE — Progress Notes (Signed)
Sheath Removal Note Site: Right Femoral, Level 0 prior to removal Pressure Applied For: 21 minutes beginning at 1639 and end ay 1700 Post Removal Site: Level 0 Post Cath Instructions given? Yes Post Sheath pulse present? Yes Pressure dressing applied? Yes  Pt remain stable throughout procedure. VSS. Pt denies pain.  San Jet[place] RN

## 2016-01-27 NOTE — Progress Notes (Signed)
Pt denied CP at ~0715. Nitro gtt titrated down to 24mg. Pain reassessed at ~0915 and pt verbalized 3/10 CP. Nitro gtt titrated back to 361m. Will continue assess and monitor pt closely.

## 2016-01-27 NOTE — H&P (View-Only) (Signed)
Patient Name: Steven Cameron Date of Encounter: 01/27/2016    SUBJECTIVE:. He currently feels well. No recurrence of chest discomfort since admission. No difficulty since bypass surgery. Followed by Dr. Warren Danes.  Bypass grafts from 2000 for include SVG sequential to ramus and circumflex, SVG to diagonal, SVG to RCA, and LIMA to LAD. All grafts were patent at coronary angiography in 2005.  After speaking with the patient this morning, though the discomfort resolved yesterday, it has recurred overnight.. This mild persisting discomfort even at this point. He has required increasing nitroglycerin titration. He has mild persistent discomfort at this point.  TELEMETRY:  Normal sinus rhythm. Filed Vitals:   01/27/16 0600 01/27/16 0700 01/27/16 0800 01/27/16 0917  BP: 91/56 100/61 98/68 102/61  Pulse: 62 61 58 66  Temp:  97.7 F (36.5 C)    TempSrc:  Oral    Resp: _0 Height:      Weight:      SpO2: 91% 94% 96%     Intake/Output Summary (Last 24 hours) at 01/27/16 1003 Last data filed at 01/27/16 0915  Gross per 24 hour  Intake 589.02 ml  Output   2325 ml  Net -1735.98 ml   LABS: Basic Metabolic Panel:  Recent Labs  01/26/16 1348 01/26/16 1406  NA 134* 136  K 4.6 4.3  CL 97* 98*  CO2 24  --   GLUCOSE 102* 98  BUN 39* 45*  CREATININE 1.37* 1.40*  CALCIUM 9.4  --    CBC:  Recent Labs  01/26/16 1348 01/26/16 1406 01/27/16 0247  WBC 6.7  --  7.9  HGB 13.2 14.6 11.6*  HCT 39.2 43.0 33.9*  MCV 91.0  --  89.4  PLT 179  --  178   Cardiac Enzymes:  Recent Labs  01/26/16 1527 01/26/16 2050 01/27/16 0247  TROPONINI 6.33* 7.34* 9.76*     Radiology/Studies:  Prior coronary bypass grafting. Basal pulmonary scarring. No volume overload and evidence of heart failure.  Physical Exam: Blood pressure 102/61, pulse 66, temperature 97.7 F (36.5 C), temperature source Oral, resp. rate 16, height 6' 1" (1.854 m), weight 194 lb 7.1 oz (88.2  kg), SpO2 96 %. Weight change:   Wt Readings from Last 3 Encounters:  01/27/16 194 lb 7.1 oz (88.2 kg)  12/18/15 189 lb 12.8 oz (86.093 kg)  11/08/15 194 lb 3.6 oz (88.1 kg)   S4 gallop. Crackles at the bases bilaterally. Radial pulses and femoral pulses are 2+ and symmetric.  ASSESSMENT:  1. Acute coronary syndrome/non-ST elevation myocardial infarction. Pain resolved and has not recurred multiple times since admission. Current low-grade mild tightness in the chest. 2. Acute on chronic kidney failure, stage II-III 3. Chronic kidney disease, stage III. Creatinine pending this morning 4. Dyspnea has been a chronic complaint according to the patient. In this setting I'm concerned about the possibility of acute on chronic diastolic heart failure.  Plan:  1. With ongoing stuttering chest discomfort, proceeding with urgent catheterization is indicated. I have spoken with Dr. Ellyn Hack who is the STEMI physician will come in and perform the procedure. 2. The procedure and risks were discussed with the patient in detail including the possibility of stroke, death, myocardial infarction, bleeding, kidney failure, among other less frequent complications. Patient is willing to proceed.  Valerie Roys W 01/27/2016, 10:03 AM

## 2016-01-27 NOTE — Progress Notes (Signed)
CRITICAL VALUE ALERT  Critical value received:  Troponin 9.76  Date of notification:  01/27/16  Time of notification: 5:15  Nurse who received alert:  Arma Heading  MD notified (1st page):  [place]  Time of first page:  5:17  Responding MD:  Marlowe Sax  Time MD responded: 5:19

## 2016-01-27 NOTE — Progress Notes (Addendum)
Patient Name: Steven Cameron Date of Encounter: 01/27/2016    SUBJECTIVE:. He currently feels well. No recurrence of chest discomfort since admission. No difficulty since bypass surgery. Followed by Dr. Warren Danes.  Bypass grafts from 2000 for include SVG sequential to ramus and circumflex, SVG to diagonal, SVG to RCA, and LIMA to LAD. All grafts were patent at coronary angiography in 2005.  After speaking with the patient this morning, though the discomfort resolved yesterday, it has recurred overnight.. This mild persisting discomfort even at this point. He has required increasing nitroglycerin titration. He has mild persistent discomfort at this point.  TELEMETRY:  Normal sinus rhythm. Filed Vitals:   01/27/16 0600 01/27/16 0700 01/27/16 0800 01/27/16 0917  BP: 91/56 100/61 98/68 102/61  Pulse: 62 61 58 66  Temp:  97.7 F (36.5 C)    TempSrc:  Oral    Resp: _0 Height:      Weight:      SpO2: 91% 94% 96%     Intake/Output Summary (Last 24 hours) at 01/27/16 1003 Last data filed at 01/27/16 0915  Gross per 24 hour  Intake 589.02 ml  Output   2325 ml  Net -1735.98 ml   LABS: Basic Metabolic Panel:  Recent Labs  01/26/16 1348 01/26/16 1406  NA 134* 136  K 4.6 4.3  CL 97* 98*  CO2 24  --   GLUCOSE 102* 98  BUN 39* 45*  CREATININE 1.37* 1.40*  CALCIUM 9.4  --    CBC:  Recent Labs  01/26/16 1348 01/26/16 1406 01/27/16 0247  WBC 6.7  --  7.9  HGB 13.2 14.6 11.6*  HCT 39.2 43.0 33.9*  MCV 91.0  --  89.4  PLT 179  --  178   Cardiac Enzymes:  Recent Labs  01/26/16 1527 01/26/16 2050 01/27/16 0247  TROPONINI 6.33* 7.34* 9.76*     Radiology/Studies:  Prior coronary bypass grafting. Basal pulmonary scarring. No volume overload and evidence of heart failure.  Physical Exam: Blood pressure 102/61, pulse 66, temperature 97.7 F (36.5 C), temperature source Oral, resp. rate 16, height 6' 1" (1.854 m), weight 194 lb 7.1 oz (88.2  kg), SpO2 96 %. Weight change:   Wt Readings from Last 3 Encounters:  01/27/16 194 lb 7.1 oz (88.2 kg)  12/18/15 189 lb 12.8 oz (86.093 kg)  11/08/15 194 lb 3.6 oz (88.1 kg)   S4 gallop. Crackles at the bases bilaterally. Radial pulses and femoral pulses are 2+ and symmetric.  ASSESSMENT:  1. Acute coronary syndrome/non-ST elevation myocardial infarction. Pain resolved and has not recurred multiple times since admission. Current low-grade mild tightness in the chest. 2. Acute on chronic kidney failure, stage II-III 3. Chronic kidney disease, stage III. Creatinine pending this morning 4. Dyspnea has been a chronic complaint according to the patient. In this setting I'm concerned about the possibility of acute on chronic diastolic heart failure.  Plan:  1. With ongoing stuttering chest discomfort, proceeding with urgent catheterization is indicated. I have spoken with Dr. Ellyn Hack who is the STEMI physician will come in and perform the procedure. 2. The procedure and risks were discussed with the patient in detail including the possibility of stroke, death, myocardial infarction, bleeding, kidney failure, among other less frequent complications. Patient is willing to proceed.  Valerie Roys W 01/27/2016, 10:03 AM

## 2016-01-27 NOTE — Progress Notes (Signed)
ANTICOAGULATION CONSULT NOTE - Follow Up Consult  Pharmacy Consult for heparin Indication: chest pain/ACS   Labs:  Recent Labs  01/26/16 1348 01/26/16 1405 01/26/16 1406 01/26/16 1527 01/26/16 2050 01/27/16 0030  HGB 13.2  --  14.6  --   --   --   HCT 39.2  --  43.0  --   --   --   PLT 179  --   --   --   --   --   APTT  --  27  --   --   --   --   LABPROT  --  13.7  --   --   --   --   INR  --  1.03  --   --   --   --   HEPARINUNFRC  --   --   --   --   --  0.25*  CREATININE 1.37*  --  1.40*  --   --   --   TROPONINI  --   --   --  6.33* 7.34*  --      Assessment: 80yo male subtherapeutic on heparin with initial dosing for CP.  Goal of Therapy:  Heparin level 0.3-0.7 units/ml   Plan:  Will increase heparin gtt by ~2 units/kg/hr to 1400 units/hr and check level in [place], PharmD, BCPS  01/27/2016,1:02 AM

## 2016-01-27 NOTE — Interval H&P Note (Signed)
History and Physical Interval Note:  01/27/2016 11:07 AM  Allegra Lai  has presented today for surgery, with the diagnosis of Non-stemi .  The various methods of treatment have been discussed with the patient and family. After consideration of risks, benefits and other options for treatment, the patient has consented to  Procedure(s): Left Heart Cath and Coronary Angiography (N/A) as a surgical intervention .  The patient's history has been reviewed, patient examined, no change in status, stable for surgery.  I have reviewed the patient's chart and labs.  Questions were answered to the patient's satisfaction.     [place], [place]  Cath Lab Visit (complete for each Cath Lab visit)  Clinical Evaluation Leading to the Procedure:   ACS: Yes.    Non-ACS:    Anginal Classification: CCS IV  Anti-ischemic medical therapy: Maximal Therapy (2 or more classes of medications)  Non-Invasive Test Results: No non-invasive testing performed  Prior CABG: Previous CABG

## 2016-01-27 NOTE — Brief Op Note (Signed)
BRIEF CATH-PCI NOTE  01/26/2016 - 01/27/2016  1:12 PM    NAME:  Steven Cameron   MRN: [id number] DOB:  [birth year]   ADMIT DATE: 01/26/2016  Brief Cardia Catheterization Note:  Indication: 1. NSTEMI (Aborted Inferior STEMI) 2. H/o CAD - CABG x 5  Procedures: 1. LEFT Catheterization with Native Coroanry & Graft Angiography via L Radial & R Groin Artery access - L Radial Access - unable to engage LCA & SVG-RCA - > converted to R Groin. - TR Band 14 @ 1315 2. PCI x 2 of seqSVG-RI-OM 100% thrombotically occluded prox (Promus 3.5 mm x 32 mm - - 3.8 mm) & 80% in sequential limb (Promus 3.5 mm x 12 mm - 3.8 mm) (through graft - unable to use protection device due to 100% occlusion  Medications:  1 mg Versed IV; 75 mcg Fentanyl IV  Heparin 6500 U (when planned Radial)  Angiomax Bolus & gtt once converted to Groin  D/c NTG gtt  NS 100 mL bolus  Impression:  Severe Native CAD with essentially occluded LMCA with trace flow in prox LAD  Small - RCA with prox ~80% - competitive flow in PDA  100% prox thrombotic occlusion of seqSVG-RI-OM (post PTCA - 80% noted in sequential LIMB  Successful 2 site PCI @ occlusion site & sequential limb  Patent SVG-Diag, LIMA-LAD & SVG-RCA  Moderate - to - Severely reduced LV EF with mod-severely elevated LVEDP - EF ~30-35% -- prox anterior & mid-apical inferior Hypokinesis  Hemodynamics:   Central AoP: 105/45/66 mmHg  LVP/EDP: 105/10/25 mmHg  Recommendations:  DAPT x ~1 yr (or at least Brilinta x 1 yr with ASA 3-12 months  Would Check 2 D Echo to get better assessment of EF  Titrate CHF medications  Full note to follow  Leonie Man, M.D., M.S. Interventional Cardiologist   Pager # [phone] Phone # [phone] [address] [place],  [address]  01/27/2016 1:13 PM

## 2016-01-28 ENCOUNTER — Encounter (HOSPITAL_COMMUNITY)
Admission: EM | Disposition: A | Discharge status: Home / Self Care | Payer: Self-pay | Source: Home / Self Care | Attending: Cardiovascular Disease

## 2016-01-28 ENCOUNTER — Ambulatory Visit (HOSPITAL_COMMUNITY): Payer: Medicare Other

## 2016-01-28 ENCOUNTER — Encounter (HOSPITAL_COMMUNITY): Payer: Self-pay | Admitting: Cardiology

## 2016-01-28 DIAGNOSIS — I5021 Acute systolic (congestive) heart failure: Secondary | ICD-10-CM

## 2016-01-28 DIAGNOSIS — I110 Hypertensive heart disease with heart failure: Secondary | ICD-10-CM

## 2016-01-28 DIAGNOSIS — Z951 Presence of aortocoronary bypass graft: Secondary | ICD-10-CM

## 2016-01-28 DIAGNOSIS — I509 Heart failure, unspecified: Secondary | ICD-10-CM

## 2016-01-28 LAB — BASIC METABOLIC PANEL
Anion gap: 8 (ref 5–15)
BUN: 25 mg/dL — AB (ref 6–20)
CALCIUM: 8.8 mg/dL — AB (ref 8.9–10.3)
CO2: 25 mmol/L (ref 22–32)
CREATININE: 1.09 mg/dL (ref 0.61–1.24)
Chloride: 104 mmol/L (ref 101–111)
Glucose, Bld: 84 mg/dL (ref 65–99)
Potassium: 4 mmol/L (ref 3.5–5.1)
Sodium: 137 mmol/L (ref 135–145)

## 2016-01-28 LAB — CBC
HEMATOCRIT: 34.6 % — AB (ref 39.0–52.0)
Hemoglobin: 11.5 g/dL — ABNORMAL LOW (ref 13.0–17.0)
MCH: 29.8 pg (ref 26.0–34.0)
MCHC: 33.2 g/dL (ref 30.0–36.0)
MCV: 89.6 fL (ref 78.0–100.0)
Platelets: 168 10*3/uL (ref 150–400)
RBC: 3.86 MIL/uL — ABNORMAL LOW (ref 4.22–5.81)
RDW: 13.3 % (ref 11.5–15.5)
WBC: 6.1 10*3/uL (ref 4.0–10.5)

## 2016-01-28 SURGERY — LEFT HEART CATH AND CORS/GRAFTS ANGIOGRAPHY
Anesthesia: LOCAL

## 2016-01-28 MED ORDER — FUROSEMIDE 40 MG PO TABS
40.0000 mg | ORAL_TABLET | Freq: Every day | ORAL | Status: DC
  Administered 2016-01-28 – 2016-01-29 (×2): 40 mg via ORAL
  Filled 2016-01-28 (×2): qty 1

## 2016-01-28 MED ORDER — PERFLUTREN LIPID MICROSPHERE
INTRAVENOUS | Status: AC
  Administered 2016-01-28: 2 mL
  Filled 2016-01-28: qty 10

## 2016-01-28 MED FILL — Heparin Sodium (Porcine) 2 Unit/ML in Sodium Chloride 0.9%: INTRAMUSCULAR | Qty: 500 | Status: AC

## 2016-01-28 NOTE — Care Management Note (Signed)
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   Expected Discharge Date:                  Expected Discharge Plan:  Home/Self Care  In-House Referral:     Discharge planning Services  CM Consult, Medication Assistance  Post Acute Care Choice:    Choice offered to:     DME Arranged:    DME Agency:     HH Arranged:    Accident Agency:     Status of Service:     Medicare Important Message Given:    Date Medicare IM Given:    Medicare IM give by:    Date Additional Medicare IM Given:    Additional Medicare Important Message give by:     If discussed at [place] of Stay Meetings, dates discussed:    Additional Comments: left pt 30day free brilinta card. Pt states has supplement to medicare that covers meds.  Lacretia Leigh, RN 01/28/2016, 10:30 AM

## 2016-01-28 NOTE — Progress Notes (Signed)
CARDIAC REHAB PHASE I   PRE:  Rate/Rhythm: 57 SB  BP:  Sitting: 105/62        SaO2: 98 4L  MODE:  Ambulation: 270 ft   POST:  Rate/Rhythm: 78 SR  BP:  Sitting: 135/54         SaO2: 88 4L, 95 on 4L after 2 minutes rest  Pt up in recliner, very flat affect, short responses. Pt ambulated 270 ft on 4L O2, rolling walker, handheld assist, fairly steady gait, tolerated fair  Pt c/o of DOE, states this is his baseline, denies cp, dizziness, brief standing rest x1. Pt states he could have gone farther, however advised pt to return to room given significant DOE. Pt also c/o chronic pain in knees and back due to arthritis, states this is also very limiting. Pt states he has a cane and a walker at home, but he primarily ambulates with a cane. Began MI/stent education.  Reviewed risk factors, anti-platelet therapy, stent card, activity restrictions, ntg, exercise, heart healthy diet and phase 2 cardiac rehab. Pt verbalized understanding, did not make eye contact during most of education then became very tearful when discussing exercise and cardiac rehab. Pt did not say much, but states that he cannot do what he wants to do physically and states "what's the point-we've all got to die some day." Emotional support given to pt. Pt agrees to phase 2 cardiac rehab referral, will send to [facility]. Pt nose bleeding as well. RN notified. Pt to recliner after walk, call bell within reach. Will follow-up tomorrow.   [phone]  Lenna Sciara, RN, BSN 01/28/2016 10:41 AM

## 2016-01-28 NOTE — Progress Notes (Signed)
PATIENT ID: [id number] with CAD s/p CABG in 2000 (SVG sequential to ramus and circumflex, SVG to diagonal, SVG to RCA, and LIMA to LAD. All grafts were patent at coronary angiography in 2005), hypertension and hyperlipidemia here with NSTEMI no s/p PCI of the SVG to the RCA.  INTERVAL HISTORY: No events overnight.  Steven Cameron underwent PCI of the SVG to the RCA yesterday.  LV gram showed reduced LVEF.  SUBJECTIVE:  Feeling well.  Denies chest pain or shortness of breath.  Lower extremity edema has improved.   PHYSICAL EXAM Filed Vitals:   01/28/16 0800 01/28/16 1000 01/28/16 1100 01/28/16 1145  BP: 102/61 114/61    Pulse: 60 56 52 57  Temp: 97.9 F (36.6 C)     TempSrc: Oral     Resp: _0 Height:      Weight:      SpO2: 96% 99% 99% 97%   General:   Well-appearing.  No acute distress Neck: JVP 1 cm above the clavicle sitting upright Lungs:  Mild bibasilar crackles.   Heart:  RRR.  No m/r/g.  Normal S1/S2 Abdomen:  Soft, NT, ND. +BS Extremities:  WWP. 1+ pitting edema to above the ankles bilaterally.  LABS: Lab Results  Component Value Date   TROPONINI 9.76* 01/27/2016   Results for orders placed or performed during the hospital encounter of 01/26/16 (from the past 24 hour(s))  POCT Activated clotting time     Status: None   Collection Time: 01/27/16 12:40 PM  Result Value Ref Range   Activated Clotting Time 477 seconds  POCT Activated clotting time     Status: None   Collection Time: 01/27/16  3:54 PM  Result Value Ref Range   Activated Clotting Time 173 seconds  CBC     Status: Abnormal   Collection Time: 01/28/16  2:12 AM  Result Value Ref Range   WBC 6.1 4.0 - 10.5 K/uL   RBC 3.86 (L) 4.22 - 5.81 MIL/uL   Hemoglobin 11.5 (L) 13.0 - 17.0 g/dL   HCT 34.6 (L) 39.0 - 52.0 %   MCV 89.6 78.0 - 100.0 fL   MCH 29.8 26.0 - 34.0 pg   MCHC 33.2 30.0 - 36.0 g/dL   RDW 13.3 11.5 - 15.5 %   Platelets 168 150 - 400 K/uL  Basic metabolic panel     Status: Abnormal   Collection Time: 01/28/16  2:12 AM  Result Value Ref Range   Sodium 137 135 - 145 mmol/L   Potassium 4.0 3.5 - 5.1 mmol/L   Chloride 104 101 - 111 mmol/L   CO2 25 22 - 32 mmol/L   Glucose, Bld 84 65 - 99 mg/dL   BUN 25 (H) 6 - 20 mg/dL   Creatinine, Ser 1.09 0.61 - 1.24 mg/dL   Calcium 8.8 (L) 8.9 - 10.3 mg/dL   GFR calc non Af Amer >60 >60 mL/min   GFR calc Af Amer >60 >60 mL/min   Anion gap 8 5 - 15    Intake/Output Summary (Last 24 hours) at 01/28/16 1223 Last data filed at 01/28/16 0800  Gross per 24 hour  Intake 1188.66 ml  Output   1425 ml  Net -236.34 ml    Telemetry: Sinus bradycardia and sinus rhythm.  Occasional PVCs.  ASSESSMENT AND PLAN:  Active Problems:   Hx of CABG   Dyslipidemia   Benign hypertensive heart disease without heart failure   AKI (acute kidney injury) ([place])  NSTEMI (non-ST elevated myocardial infarction) [facility])    # CAD s/p NSTEMI:  Steven Cameron underwent successful PCI of the SVG to the RI-OM.  He is now chest pain free.  Plavix was switched to ticagrelor.  Plan for DAPT for at least one year.  No beta blocker due to bradycardia.   # Acute systolic heart failure:  LVEF was 60-65% 10/2015.  However, systolic function was reduced on LV gram.  Echo is pending.  He appears mildly volume overloaded on exam.  Will increase his home lasix 20 mg po daily to 40 mg.  # Hypertensive heart disease: Blood pressure is well-controlled.  Home valsartan/HCTZ has been held due to hypotension.  Will restart when able.  # Hyperlipidemia:  Steven Cameron has statin intolerance but LDL is 89 on lovastatin 10 mg daily.  This was switched to pravastatin 80 mg daily.  If he develops intolerance this dose can be reduced.   Mcadoo Muzquiz C. Oval Linsey, MD, [facility] 01/28/2016 12:23 PM

## 2016-01-28 NOTE — Progress Notes (Signed)
Echocardiogram 2D Echocardiogram with Definity has been performed.  Tresa Res 01/28/2016, 4:01 PM

## 2016-01-29 ENCOUNTER — Encounter (HOSPITAL_COMMUNITY): Payer: Self-pay | Admitting: Physician Assistant

## 2016-01-29 ENCOUNTER — Telehealth: Payer: Self-pay | Admitting: Physician Assistant

## 2016-01-29 DIAGNOSIS — I10 Essential (primary) hypertension: Secondary | ICD-10-CM | POA: Diagnosis present

## 2016-01-29 DIAGNOSIS — K219 Gastro-esophageal reflux disease without esophagitis: Secondary | ICD-10-CM | POA: Diagnosis present

## 2016-01-29 LAB — CBC
HCT: 34.7 % — ABNORMAL LOW (ref 39.0–52.0)
HEMOGLOBIN: 11.6 g/dL — AB (ref 13.0–17.0)
MCH: 29.9 pg (ref 26.0–34.0)
MCHC: 33.4 g/dL (ref 30.0–36.0)
MCV: 89.4 fL (ref 78.0–100.0)
Platelets: 173 10*3/uL (ref 150–400)
RBC: 3.88 MIL/uL — AB (ref 4.22–5.81)
RDW: 13.4 % (ref 11.5–15.5)
WBC: 6.1 10*3/uL (ref 4.0–10.5)

## 2016-01-29 LAB — BASIC METABOLIC PANEL
Anion gap: 11 (ref 5–15)
BUN: 26 mg/dL — AB (ref 6–20)
CHLORIDE: 98 mmol/L — AB (ref 101–111)
CO2: 27 mmol/L (ref 22–32)
Calcium: 9 mg/dL (ref 8.9–10.3)
Creatinine, Ser: 1.08 mg/dL (ref 0.61–1.24)
GFR calc Af Amer: >60 mL/min (ref >60)
GFR calc non Af Amer: >60 mL/min (ref >60)
GLUCOSE: 93 mg/dL (ref 65–99)
POTASSIUM: 4.1 mmol/L (ref 3.5–5.1)
Sodium: 136 mmol/L (ref 135–145)

## 2016-01-29 MED ORDER — METOPROLOL TARTRATE 25 MG PO TABS: 25.0000 mg | ORAL_TABLET | Freq: Two times a day (BID) | ORAL | Status: DC

## 2016-01-29 MED ORDER — LOVASTATIN 20 MG PO TABS: 20.0000 mg | ORAL_TABLET | Freq: Every day | ORAL | Status: DC

## 2016-01-29 MED ORDER — TICAGRELOR 90 MG PO TABS: 90.0000 mg | ORAL_TABLET | Freq: Two times a day (BID) | ORAL | Status: DC

## 2016-01-29 NOTE — Telephone Encounter (Signed)
TCM phone call // Appt is 02/05/16 at 11am w/ Angelena Form

## 2016-01-29 NOTE — Care Management Important Message (Signed)
Important Message  Patient Details  Name: Steven Cameron MRN: [id number] Date of Birth: 1935-04-20   Medicare Important Message Given:  Yes    [name] [name] 01/29/2016, 3:15 PM

## 2016-01-29 NOTE — Progress Notes (Signed)
PATIENT ID: [id number] with CAD s/p CABG in 2000 (SVG sequential to ramus and circumflex, SVG to diagonal, SVG to RCA, and LIMA to LAD. All grafts were patent at coronary angiography in 2005), hypertension and hyperlipidemia here with NSTEMI s/p PCI of the SVG to the RCA and new systolic heart failure.  INTERVAL HISTORY: No events  SUBJECTIVE:  Feeling well.  Denies chest pain or shortness of breath.  Lower extremity edema has improved with increased dose of lasix.   PHYSICAL EXAM Filed Vitals:   01/28/16 2340 01/29/16 0427 01/29/16 0428 01/29/16 0500  BP: 99/55 100/52    Pulse: 59 55 55   Temp: 98.2 F (36.8 C)  97.9 F (36.6 C)   TempSrc: Oral  Oral   Resp: _0 Height:      Weight:    85.3 kg (188 lb 0.8 oz)  SpO2: 92% 96% 97%    General:   Well-appearing.  No acute distress Neck: No JVD.  No carotid bruits. Lungs:  Faint bibasilar crackles but improved from yesterday.   Heart:  RRR.  No m/r/g.  Normal S1/S2 Abdomen:  Soft, NT, ND. +BS Extremities:  WWP.  Trace edema to above the ankles bilaterally.  LABS: Lab Results  Component Value Date   TROPONINI 9.76* 01/27/2016   Results for orders placed or performed during the hospital encounter of 01/26/16 (from the past 24 hour(s))  CBC     Status: Abnormal   Collection Time: 01/29/16  3:14 AM  Result Value Ref Range   WBC 6.1 4.0 - 10.5 K/uL   RBC 3.88 (L) 4.22 - 5.81 MIL/uL   Hemoglobin 11.6 (L) 13.0 - 17.0 g/dL   HCT 34.7 (L) 39.0 - 52.0 %   MCV 89.4 78.0 - 100.0 fL   MCH 29.9 26.0 - 34.0 pg   MCHC 33.4 30.0 - 36.0 g/dL   RDW 13.4 11.5 - 15.5 %   Platelets 173 150 - 400 K/uL  Basic metabolic panel     Status: Abnormal   Collection Time: 01/29/16  3:14 AM  Result Value Ref Range   Sodium 136 135 - 145 mmol/L   Potassium 4.1 3.5 - 5.1 mmol/L   Chloride 98 (L) 101 - 111 mmol/L   CO2 27 22 - 32 mmol/L   Glucose, Bld 93 65 - 99 mg/dL   BUN 26 (H) 6 - 20 mg/dL   Creatinine, Ser 1.08 0.61 - 1.24 mg/dL   Calcium 9.0  8.9 - 10.3 mg/dL   GFR calc non Af Amer >60 >60 mL/min   GFR calc Af Amer >60 >60 mL/min   Anion gap 11 5 - 15    Intake/Output Summary (Last 24 hours) at 01/29/16 0800 Last data filed at 01/29/16 0428  Gross per 24 hour  Intake    323 ml  Output   1150 ml  Net   -827 ml    Telemetry: Sinus sinus rhythm.  Occasional skipped beats.  Echo 01/28/16:   Study Conclusions  - Left ventricle: Hypokinesis of the distal posterior wall. The cavity size was normal. Wall thickness was normal. Systolic function was normal. The estimated ejection fraction was in the range of 50% to 55%. Doppler parameters are consistent with abnormal left ventricular relaxation (grade 1 diastolic dysfunction). - Mitral valve: There was mild regurgitation. - Right ventricle: The cavity size was mildly to moderately dilated. Wall thickness was normal. Systolic function was mildly to moderately reduced. - Pulmonary arteries: PA peak  pressure: 70 mm Hg (S).  ASSESSMENT AND PLAN:  Active Problems:   Hx of CABG   Dyslipidemia   Hypertensive heart disease   AKI (acute kidney injury) ([place])   NSTEMI (non-ST elevated myocardial infarction) [facility])    # CAD s/p NSTEMI:  Mr. Swiss underwent successful PCI of the SVG to the RI-OM.  He is now chest pain free.  Plavix was switched to ticagrelor.  Plan for DAPT for at least one year.  Continue metoprolol.  Continue cardiac rehab.  # Acute systolic heart failure:  LVEF was 60-65% 10/2015.  However, systolic function was reduced on LV gram.  Subsequent echo showed EF 50-55% with posterior hypokinesis.  BP too low for ACE/ARB.  Diuresed well with increased dose of lasix.  Plan for lasix 12m daily x2 days then reduce to 246mdaily.  # Hypertensive heart disease: Blood pressure is well-controlled.  Home valsartan/HCTZ has been held due to hypotension.  Will restart when able.  Continue metoprolol for now.  # Hyperlipidemia:  Mr. OsLewellenas statin  intolerance but LDL is 89 on lovastatin 10 mg daily.  This was switched to pravastatin 80 mg daily.  Will discharge home on lovastatin 2074mnd plan for follow up in lipid clinic for titration of statin.  Needs follow up with cardiology clinic in 1 week.  Netta Fodge C. RanOval LinseyD, FACLimestone Surgery Center LLC31/2017 8:00 AM

## 2016-01-29 NOTE — Progress Notes (Signed)
CARDIAC REHAB PHASE I   PRE:  Rate/Rhythm: 25 SR  BP:  Supine:   Sitting: 104/53  Standing:    SaO2: 98%3L  MODE:  Ambulation: 350 ft   POST:  Rate/Rhythm: 106 ST   66 with rest  BP:  Supine:   Sitting: 126/55  Standing:    SaO2: 86% 3L   92% with rest up to 97% 0945-1015 Pt walked 350 ft on 3L stopping a couple of times to rest. Tried to get sat to register in hallway without success. Denied CP. Pt stated he usually is on 3L at home so that is why I walked him on 3. Upon return to room, sats at 86% but quickly up with sitting. Pt did not want diet instruction as he stated his wife took diet sheets home and he had been through this before. Call light given.    Graylon Good, RN BSN  01/29/2016 10:11 AM

## 2016-01-29 NOTE — Progress Notes (Signed)
Pt given d/c instructions/script and verbalizes understanding. Will d/c via wc to private vehicle.

## 2016-01-29 NOTE — Discharge Summary (Signed)
Discharge Summary    Patient ID: Steven Cameron,  MRN: [id number], DOB/AGE: Jan 30, 1935 81 y.o.  Admit date: 01/26/2016 Discharge date: 01/29/2016  Primary Care Provider: Imagene Riches Primary Cardiologist: Dr. Mare Ferrari Dr. Ellyn Hack.    Discharge Diagnoses    Principal Problem:   NSTEMI (non-ST elevated myocardial infarction) ([place]) Active Problems:   Hx of CABG   Dyslipidemia   Hypertensive heart disease   Neuropathy (HCC)   AKI (acute kidney injury) (HCC)   Moderate to severe pulmonary hypertension (HCC)   Coronary artery disease   Hypertension   GERD (gastroesophageal reflux disease)   Allergies Allergies  Allergen Reactions  . Lyrica [Pregabalin]     Gait instability  . Statins Other (See Comments)    Weakness of legs  . Ibuprofen Rash     History of Present Illness     Steven Cameron is a 81 y.o. male with a history of CAD s/p CABG in 2004, HTN, HLD, peripheral neuropathy, and carotid bruits who presented to [facility] on 01/26/16 with chest pain/NSTEMI.   He has a history of HLD, intolerant to statins previously on low-dose of lovastatin 50m and also red yeast rice. Bypass grafts from 2004 include SVG sequential to ramus and circumflex, SVG to diagonal, SVG to RCA, and LIMA to LAD. All grafts were patent at coronary angiography in 2005.  He was in his usual state of health until the evening of 01/25/16 after having late lunch started having chest discomfort. Initially was mild but persisted through the night. His wife forced him to come to the hospital the following AM due to persistent chest discomfort. On presentation to the hospital in the emergency room was found to have significant ST segment depression in the anterior leads with ST elevation in the inferior leads, STEMI was activated. Patient was administered IV heparin 4000 units along with aspirin and sublingual nitroglycerin. Chest pain immediately resolved and ST segments changes were back to baseline with  very minimal ST depression in the anterior lead especially V1 and V2. Essentially his EKG was back to his original baseline. Due to renal insufficiency (creat 1.4) and resolution of chest pain and ECG changes, it was decided to admit him to stepdown and hydrate him before cath. His ARB was held. He had ongoing stuttering chest discomfort and was taken back for urgent cardiac cath on 01/27/16.  LHC revealed known severe native coronary disease with essentially subtotally occluded left coronary system and diffusely diseased RCA. Culprit lesion is the occlusion of the SVG-RI-OM - he did successfully with 2 separate stents in the main body and then in the sequential limb. Otherwise vein grafts to the distal RCA and diagonal are patent along with LIMA to LAD. He was placed on DAPT with ASA and Brilinta for at least 1 year ( plavix stopped). Of note, he also had elevated LVEDP and EF by LV gram 25-25%. He was given lasix 451mdaily. Repeat 2D ECHO on 01/28/16 showed EF 50-55%, G1DD, mild MR, mild RV systolic dysfunction, mild-mod RV dilation, PA pk pressure 7044mg. He was felt to be stable on 01/29/16 and discharged home on lasix 66m53m days followed by Lasix 20mg67mly. Also Lovastatin was increased from home dose of 10mg 68m0mg. 73mHCTZ held at discharge.    Hospital Course     Consultants: none.   CAD s/p NSTEMI: peak troponin 9.76. Steven Cameron successful PCI of the SVG to the RI-OM on 01/27/16. He is now chest pain free.  Plavix was switched to ticagrelor. Plan for DAPT for at least one year. Continue metoprolol. Continue cardiac rehab.  Acute systolic/diastolic heart failure: LVEF was 60-65% 10/2015. However, systolic function was reduced on LV gram.Subsequent echo showed EF 50-55% with posterior hypokinesis. BP too low for ACE/ARB. Diuresed well with increased dose of lasix. Plan for lasix 31m daily x2 days then reduce to 251mdaily.  Hypertensive heart disease: Blood pressure is  well-controlled. Home valsartan/HCTZ has been held due to hypotension. Will restart when able as an outpatient. Continue metoprolol for now.  Hyperlipidemia: Mr. OsJesusas statin intolerance but LDL is 89 on lovastatin 10 mg daily. This was switched to pravastatin 80 mg daily. Will discharge home on lovastatin 2049mnd plan for follow up in lipid clinic for titration of statin.   Pulmonary HTN: PA pk pressure 70 by most recent 2D ECHO. We will initiate pulm HTN work up as an outpatient.  AKI: likely due to dehydration. Creat 1.4 on admission and returned to normal by discharge (1.08). Repeat BMET at 1 week f/up  Carotid bruits: will get carotid dopplers as an outpatient   The patient has had an uncomplicated hospital course and is recovering well. The radial catheter site is stable. He has been seen by [name] and deemed ready for discharge home. All follow-up appointments have been scheduled. A written RX for a 30 day free supply of Brilinta was provided for the patient. Discharge medications are listed below.  _____________  Discharge Vitals Blood pressure 104/65, pulse 63, temperature 97.8 F (36.6 C), temperature source Oral, resp. rate 17, height _0  (1.854 m), weight 188 lb 0.8 oz (85.3 kg), SpO2 98 %.  Filed Weights   01/27/16 0342 01/28/16 0344 01/29/16 0500  Weight: 194 lb 7.1 oz (88.2 kg) 195 lb 5.2 oz (88.6 kg) 188 lb 0.8 oz (85.3 kg)    Labs & Radiologic Studies     CBC  Recent Labs  01/28/16 0212 01/29/16 0314  WBC 6.1 6.1  HGB 11.5* 11.6*  HCT 34.6* 34.7*  MCV 89.6 89.4  PLT 168 [id number] Metabolic Panel  Recent Labs  01/28/16 0212 01/29/16 0314  NA 137 136  K 4.0 4.1  CL 104 98*  CO2 25 27  GLUCOSE 84 93  BUN 25* 26*  CREATININE 1.09 1.08  CALCIUM 8.8* 9.0    Cardiac Enzymes  Recent Labs  01/26/16 1527 01/26/16 2050 01/27/16 0247  TROPONINI 6.33* 7.34* 9.76*    Dg Chest Portable 1 View  01/26/2016  CLINICAL DATA:   Hypertension and coronary artery disease. Follow-up. EXAM: PORTABLE CHEST 1 VIEW COMPARISON:  01/14/2016 FINDINGS: Previous median sternotomy and CABG. Heart size is normal. The aorta is unfolded. There is chronic scarring of both lung bases. No sign of active infiltrate, effusion or collapse. No edema. IMPRESSION: No active disease.  Previous CABG.  Basilar pulmonary scarring. Electronically Signed   By: MarNelson ChimesD.   On: 01/26/2016 14:11     Diagnostic Studies/Procedures   LHC 01/27/16 Procedures    Coronary Stent Intervention   Left Heart Cath and Cors/Grafts Angiography    Conclusion     Ost LM to LM lesion, 99% stenosed. Ost LAD to Prox LAD lesion, 100% stenosed. Mid LAD lesion, 100% stenosed after grafed ~D2. Ost Ramus lesion, 100% stenosed. Ost Cx lesion, 100% stenosed.  Ost RCA to Prox RCA lesion, 80% stenosed. Dist RCA lesion, 70% stenosed. Ost RPDA lesion, 60% stenosed.  SVG-RI-OM: Mid Graft  lesion, before Ramus, 100% stenosed. PCI with Promus DES stent (3.5 mm x 32 mm, 3.8 mm). Post intervention, there is a 0% residual stenosis.  SVG-RI-OM mitral limb of Graft lesion, between Ramus and 1st Mrg, 80% stenosed. PCI was second Promus DES stent (3.5 mm x 12 mm, 3.7 mm) . Post intervention, there is a 0% residual stenosis.  There is moderate to severe left ventricular systolic dysfunction.  Moderate-severely elevated LVEDP  Known severe native coronary disease with essentially subtotally occluded left coronary system and diffusely diseased RCA. Culprit lesion is the occlusion of the SVG-RI-OM - he did successfully with 2 separate stents in the main body and then in the sequential limb. Otherwise vein grafts to the distal RCA and diagonal are patent along with LIMA to LAD. He does have a significant elevated LVEDP with reduced EF from recent echo.  Recommendations:  Return to CCU/TCU for post catheterization care.  DAPT x ~1 yr (or at least Brilinta x 1 yr with ASA 3-12  months  Would Check 2 D Echo to get better assessment of EF  Titrate CHF medications     2D ECHO: 01/28/2016 LV EF: 50% -   55% Study Conclusions - Left ventricle: Hypokinesis of the distal posterior wall. The   cavity size was normal. Wall thickness was normal. Systolic   function was normal. The estimated ejection fraction was in the   range of 50% to 55%. Doppler parameters are consistent with   abnormal left ventricular relaxation (grade 1 diastolic   dysfunction). - Mitral valve: There was mild regurgitation. - Right ventricle: The cavity size was mildly to moderately   dilated. Wall thickness was normal. Systolic function was mildly   to moderately reduced. - Pulmonary arteries: PA peak pressure: 70 mm Hg (S) _____________   Disposition   Pt is being discharged home today in good condition.  Follow-up Plans & Appointments    Follow-up Information    Follow up with Eileen Stanford, PA-C On 02/05/2016.   Specialties:  Cardiology, Radiology   Why:  @ 11am    Contact information:   [place] [address] [phone]      Discharge Instructions    Amb Referral to Cardiac Rehabilitation    Complete by:  As directed   Diagnosis:   Myocardial Infarction PCI             Discharge Medications   Current Discharge Medication List    START taking these medications   Details  metoprolol tartrate (LOPRESSOR) 25 MG tablet Take 1 tablet (25 mg total) by mouth 2 (two) times daily. Qty: 60 tablet, Refills: 11    ticagrelor (BRILINTA) 90 MG TABS tablet Take 1 tablet (90 mg total) by mouth 2 (two) times daily. Qty: 90 tablet, Refills: 6      CONTINUE these medications which have CHANGED   Details  lovastatin (MEVACOR) 20 MG tablet Take 1 tablet (20 mg total) by mouth at bedtime. Qty: 30 tablet, Refills: 3      CONTINUE these medications which have NOT CHANGED   Details  allopurinol (ZYLOPRIM) 100 MG tablet Take 1 tablet (100 mg  total) by mouth 2 (two) times daily. Qty: 60 tablet, Refills: 11   Associated Diagnoses: Gout    ALPRAZolam (XANAX) 0.5 MG tablet Take 0.025 mg by mouth 3 (three) times daily as needed for anxiety (anxiety).     aspirin 81 MG EC tablet Take 81 mg by mouth daily.  B Complex-C (SUPER B COMPLEX PO) Take 1 tablet by mouth daily.    cholecalciferol (VITAMIN D) 1000 UNITS tablet Take 1,000 Units by mouth daily.    furosemide (LASIX) 20 MG tablet Take 1 tablet (20 mg total) by mouth daily. Qty: 90 tablet, Refills: 2    HYDROcodone-acetaminophen (NORCO/VICODIN) 5-325 MG per tablet Take 0.5 tablets by mouth every 6 (six) hours as needed for moderate pain (pain).     Misc Natural Products (OSTEO BI-FLEX JOINT SHIELD PO) Take 1 tablet by mouth 2 (two) times daily.     nitroGLYCERIN (NITROSTAT) 0.4 MG SL tablet Place 0.4 mg under the tongue every 5 (five) minutes as needed. For chest pain    omega-3 acid ethyl esters (LOVAZA) 1 G capsule Take 1 g by mouth 3 (three) times daily.    omeprazole (PRILOSEC) 20 MG capsule Take 20 mg by mouth 2 (two) times daily.    potassium chloride SA (K-DUR,KLOR-CON) 20 MEQ tablet TAKE 1 TABLET BY MOUTH DAILY Qty: 30 tablet, Refills: 10    Red Yeast Rice 600 MG CAPS Take 600 mg by mouth 2 (two) times daily.      STOP taking these medications     clopidogrel (PLAVIX) 75 MG tablet      valsartan-hydrochlorothiazide (DIOVAN HCT) 160-12.5 MG per tablet          Aspirin prescribed at discharge?  Yes High Intensity Statin Prescribed? (Lipitor 40-67m or Crestor 20-416m: No: intolerant to high dose statins Beta Blocker Prescribed? Yes For EF 45% or less, Was ACEI/ARB Prescribed? No: n/a ADP Receptor Inhibitor Prescribed? (i.e. Plavix etc.-Includes Medically Managed Patients): No: n/a For EF <40%, Aldosterone Inhibitor Prescribed? No: n/a Was EF assessed during THIS hospitalization? Yes Was Cardiac Rehab II ordered? (Included Medically managed Patients):  Yes   Outstanding Labs/Studies   BMET and Lipid panel   Duration of Discharge Encounter   Greater than 30 minutes including physician time.  Signed, THGrandville SilosKATHRYN R PA-C 01/29/2016, 1:16 PM

## 2016-01-29 NOTE — Progress Notes (Signed)
Spoke w wife, pt uses home o2 and wife had port in car. Staff to take to car on [place] til got to his car.

## 2016-01-29 NOTE — Discharge Instructions (Signed)
Radial Site Care Refer to this sheet in the next few weeks. These instructions provide you with information on caring for yourself after your procedure. Your caregiver may also give you more specific instructions. Your treatment has been planned according to current medical practices, but problems sometimes occur. Call your caregiver if you have any problems or questions after your procedure. HOME CARE INSTRUCTIONS  You may shower the day after the procedure.Remove the bandage (dressing) and gently wash the site with plain soap and water.Gently pat the site dry.   Do not apply powder or lotion to the site.   Do not submerge the affected site in water for 3 to 5 days.   Inspect the site at least twice daily.   Do not flex or bend the affected arm for 24 hours.   No lifting over 5 pounds (2.3 kg) for 5 days after your procedure.   Do not drive home if you are discharged the same day of the procedure. Have someone else drive you.   You may drive 24 hours after the procedure unless otherwise instructed by your caregiver.  What to expect:  Any bruising will usually fade within 1 to 2 weeks.   Blood that collects in the tissue (hematoma) may be painful to the touch. It should usually decrease in size and tenderness within 1 to 2 weeks.  SEEK IMMEDIATE MEDICAL CARE IF:  You have unusual pain at the radial site.   You have redness, warmth, swelling, or pain at the radial site.   You have drainage (other than a small amount of blood on the dressing).   You have chills.   You have a fever or persistent symptoms for more than 72 hours.   You have a fever and your symptoms suddenly get worse.   Your arm becomes pale, cool, tingly, or numb.   You have heavy bleeding from the site. Hold pressure on the site.

## 2016-02-01 NOTE — Telephone Encounter (Signed)
Patient contacted regarding discharge from [facility] on 01/29/16.  Patient understands to follow up with provider Bennett Scrape on 02/05/16 at 11:00 at [facility]. Patient understands discharge instructions? yes Patient understands medications and regiment? yes Patient understands to bring all medications to this visit? yes  States he feels good. Denies CP or SOB. Denies redness or swelling in (R) arm cath site.  Verbalizes understanding regarding his medications.  He did get the two new medications and know to stop Plavix and Diovan/HCT.  Advised to bring medications to office visit. Will call if has any further questions or concerns.

## 2016-02-02 NOTE — Progress Notes (Signed)
Cardiology Office Note   Date:  02/05/2016   ID:  Steven Cameron, DOB [birth year], MRN [id number]  PCP:  Imagene Riches, NP  Cardiologist:  Dr. Ellyn Hack   hosp f/u-NSTEMI   History of Present Illness: Steven Cameron is a 81 y.o. male with a history of CAD s/p CABG in 2004, ILD/pulmonary fibrosis on 02, HTN, ulm  HLD, peripheral neuropathy, and carotid bruits who presents to clinic for post hospital follow up.  He has a history of HLD, intolerant to statins previously on low-dose of lovastatin 70m and also red yeast rice. Bypass grafts from 2004 include SVG sequential to ramus and circumflex, SVG to diagonal, SVG to RCA, and LIMA to LAD. All grafts were patent at coronary angiography in 2005.  He was admitted in 10/2015 for acute hypoxic respiratory failure and CT was suspicious for ILD/pulm fibrosis. He was discharged on home oxygen. It was recommended that he follow up with pulmonology but he refused further work up.    He was in his usual state of health until the evening of 01/25/16 when he started having chest discomfort. Initially was mild but persisted through the night. His wife forced him to come to the hospital the following AM due to persistent chest discomfort. On presentation to the hospital in the emergency room was found to have significant ST segment depression in the anterior leads with ST elevation in the inferior leads, STEMI was activated. However, chest pain immediately resolved with SL NTG and ST segment changes went back to baseline. Due to renal insufficiency (creat 1.4) and resolution of chest pain and ECG changes, it was decided to admit him to stepdown and hydrate him before cath. His ARB was held. He had ongoing stuttering chest discomfort and was taken back for urgent cardiac cath on 01/27/16.  LHC (01/27/16) revealed known severe native coronary disease with essentially subtotally occluded left coronary system and diffusely diseased RCA. Culprit lesion was the  occlusion of the SVG-RI-OM  S/p 2 separate stents in the main body and then in the sequential limb. Otherwise vein grafts to the distal RCA and diagonal were patent along with LIMA to LAD. He was placed on DAPT with ASA and Brilinta for at least 1 year ( plavix stopped). Of note, he also had elevated LVEDP and EF by LV gram 25-35%. He was given lasix 429mdaily. Repeat 2D ECHO on 01/28/16 showed EF 50-55%, G1DD, mild MR, mild RV systolic dysfunction, mild-mod RV dilation, PA pk pressure 7011mg. He was felt to be stable on 01/29/16 and discharged home on lasix 8m75m days followed by Lasix 20mg103mly. Also Lovastatin was increased from home dose of 10mg 38m0mg. 82mHCTZ held at discharge.   Today he presents to clinic for follow up. No CP . He has chronic dyspnea and on continuous 02. He has had some LE edema which is a little better today. He has been sleeping in a recliner for months. Some PND. He is getting a sleep study done through his PCP. No dizziness or passing out. No blood in his stool or urine.    Past Medical History  Diagnosis Date  . Arthritis   . Gout   . GERD (gastroesophageal reflux disease)   . Hypertension   . Hyperlipidemia   . Neuropathy (HCC)     In both legs, below knee  . Depression   . Anxiety disorder   . [facility]  . Peripheral  neuropathy ([place])     Probable  . Coronary artery disease     a. s/p CABG in 2004 b. 12/2015 NSTEMI s/p PCI/DES to SVG--> RCA  . Foot drop, bilateral 03/22/2013    Past Surgical History  Procedure Laterality Date  . Cardiac catheterization  09/24/2004    EF 60%  . Coronary artery bypass graft  2004  . US echocardiography  01/19/2008    EF 55-60%  . Cardiovascular stress test  07/17/2010    EF 61%  . Radium seed implant      Prostate cancer  . Tonsillectomy    . Hemorrhoid surgery    . Cataract extraction, bilateral    . Cardiac catheterization N/A 01/27/2016    Procedure: Left Heart Cath and Cors/Grafts  Angiography;  Surgeon: Leonie Man, MD;  Location: [facility];  Service: Cardiovascular;  Laterality: N/A;  . Cardiac catheterization N/A 01/27/2016    Procedure: Coronary Stent Intervention;  Surgeon: Leonie Man, MD;  Location: [facility];  Service: Cardiovascular;  Laterality: N/A;     Current Outpatient Prescriptions  Medication Sig Dispense Refill  . allopurinol (ZYLOPRIM) 100 MG tablet Take 1 tablet (100 mg total) by mouth 2 (two) times daily. 60 tablet 11  . ALPRAZolam (XANAX) 0.5 MG tablet Take 0.025 mg by mouth 3 (three) times daily as needed for anxiety (anxiety).     Marland Kitchen aspirin 81 MG EC tablet Take 81 mg by mouth daily.      . B Complex-C (SUPER B COMPLEX PO) Take 1 tablet by mouth daily.    . cholecalciferol (VITAMIN D) 1000 UNITS tablet Take 1,000 Units by mouth daily.    Marland Kitchen HYDROcodone-acetaminophen (NORCO/VICODIN) 5-325 MG per tablet Take 0.5 tablets by mouth every 6 (six) hours as needed for moderate pain (pain).     Marland Kitchen lovastatin (MEVACOR) 20 MG tablet Take 1 tablet (20 mg total) by mouth at bedtime. 30 tablet 3  . metoprolol tartrate (LOPRESSOR) 25 MG tablet Take 1 tablet (25 mg total) by mouth 2 (two) times daily. 60 tablet 11  . Misc Natural Products (OSTEO BI-FLEX JOINT SHIELD PO) Take 1 tablet by mouth 2 (two) times daily.     . nitroGLYCERIN (NITROSTAT) 0.4 MG SL tablet Place 0.4 mg under the tongue every 5 (five) minutes as needed for chest pain (x 3 doses). For chest pain    . omega-3 acid ethyl esters (LOVAZA) 1 G capsule Take 1 g by mouth 3 (three) times daily.    Marland Kitchen omeprazole (PRILOSEC) 20 MG capsule Take 20 mg by mouth 2 (two) times daily.    . potassium chloride SA (K-DUR,KLOR-CON) 20 MEQ tablet TAKE 1 TABLET BY MOUTH DAILY 30 tablet 10  . Red Yeast Rice 600 MG CAPS Take 600 mg by mouth 2 (two) times daily.    . ticagrelor (BRILINTA) 90 MG TABS tablet Take 1 tablet (90 mg total) by mouth 2 (two) times daily. 90 tablet 6  . furosemide (LASIX) 40  MG tablet Take 1 tablet (40 mg total) by mouth daily. 90 tablet 3   No current facility-administered medications for this visit.    Allergies:   Lyrica; Statins; and Ibuprofen    Social History:  The patient  reports that he quit smoking about 24 years ago. His smoking use included Cigarettes. He has a 75 pack-year smoking history. He has never used smokeless tobacco. He reports that he does not drink alcohol or use illicit drugs.  Family History:  The patient's family history includes Alzheimer's disease (age of onset: 63) in his mother; Coronary artery disease in his father; Prostate cancer in his father.    ROS:  Please see the history of present illness.   Otherwise, review of systems are positive for none.   All other systems are reviewed and negative.    PHYSICAL EXAM: VS:  BP 112/60 mmHg  Pulse 56  Ht _0  (1.854 m)  Wt 194 lb (87.998 kg)  BMI 25.60 kg/m2 , BMI Body mass index is 25.6 kg/(m^2). GEN: Well nourished, well developed, in no acute distress HEENT: normal Neck: no JVD, + L carotid bruits, or masses Cardiac: RRR; no murmurs, rubs, or gallops, Trace bilateral LE edema  Respiratory: mild crackles auscultation bilaterally, normal work of breathing GI: soft, nontender, nondistended, + BS MS: no deformity or atrophy Skin: warm and dry, no rash Neuro:  Strength and sensation are intact Psych: euthymic mood, full affect   EKG:  EKG is not ordered today.   Recent Labs: 10/04/2015: ALT 26 11/08/2015: B Natriuretic Peptide 125.4* 01/29/2016: BUN 26*; Creatinine, Ser 1.08; Hemoglobin 11.6*; Platelets 173; Potassium 4.1; Sodium 136    Lipid Panel    Component Value Date/Time   CHOL 148 10/04/2015 0913   TRIG 119 10/04/2015 0913   HDL 35* 10/04/2015 0913   CHOLHDL 4.2 10/04/2015 0913   VLDL 24 10/04/2015 0913   LDLCALC 89 10/04/2015 0913      Wt Readings from Last 3 Encounters:  02/05/16 194 lb (87.998 kg)  01/29/16 188 lb 0.8 oz (85.3 kg)  12/18/15 189 lb  12.8 oz (86.093 kg)      Other studies Reviewed: Additional studies/ records that were reviewed today include: LHC, 2D ECHO. Review of the above records demonstrates:     [facility] 01/27/16 Procedures    Coronary Stent Intervention   Left Heart Cath and Cors/Grafts Angiography    Conclusion     Ost LM to LM lesion, 99% stenosed. Ost LAD to Prox LAD lesion, 100% stenosed. Mid LAD lesion, 100% stenosed after grafed ~D2. Ost Ramus lesion, 100% stenosed. Ost Cx lesion, 100% stenosed.  Ost RCA to Prox RCA lesion, 80% stenosed. Dist RCA lesion, 70% stenosed. Ost RPDA lesion, 60% stenosed.  SVG-RI-OM: Mid Graft lesion, before Ramus, 100% stenosed. PCI with Promus DES stent (3.5 mm x 32 mm, 3.8 mm). Post intervention, there is a 0% residual stenosis.  SVG-RI-OM mitral limb of Graft lesion, between Ramus and 1st Mrg, 80% stenosed. PCI was second Promus DES stent (3.5 mm x 12 mm, 3.7 mm) . Post intervention, there is a 0% residual stenosis.  There is moderate to severe left ventricular systolic dysfunction.  Moderate-severely elevated LVEDP  Known severe native coronary disease with essentially subtotally occluded left coronary system and diffusely diseased RCA. Culprit lesion is the occlusion of the SVG-RI-OM - he did successfully with 2 separate stents in the main body and then in the sequential limb. Otherwise vein grafts to the distal RCA and diagonal are patent along with LIMA to LAD. He does have a significant elevated LVEDP with reduced EF from recent echo.  Recommendations:  Return to CCU/TCU for post catheterization care.  DAPT x ~1 yr (or at least Brilinta x 1 yr with ASA 3-12 months  Would Check 2 D Echo to get better assessment of EF  Titrate CHF medications     2D ECHO: 01/28/2016 LV EF: 50% -  55% Study Conclusions - Left ventricle: Hypokinesis  of the distal posterior wall. The cavity size was normal. Wall thickness was normal. Systolic function was  normal. The estimated ejection fraction was in the range of 50% to 55%. Doppler parameters are consistent with abnormal left ventricular relaxation (grade 1 diastolic dysfunction). - Mitral valve: There was mild regurgitation. - Right ventricle: The cavity size was mildly to moderately dilated. Wall thickness was normal. Systolic function was mildly to moderately reduced. - Pulmonary arteries: PA peak pressure: 70 mm Hg (S) _____________        ASSESSMENT AND PLAN: Steven Cameron is a 81 y.o. male with a history of CAD s/p CABG in 2004, ILD/pulmonary fibrosis on 02, HTN, ulm  HLD, peripheral neuropathy, and carotid bruits who presents to clinic for post hospital follow up.  CAD s/p NSTEMI: peak troponin 9.76. Mr. Mcfadden underwent successful PCI of the SVG to the RI-OM on 01/27/16. He is now chest pain free. Plavix was switched to ticagrelor. Plan for DAPT for at least one year. Continue metoprolol and statin. Continue cardiac rehab.  Acute diastolic heart failure: LVEF was 60-65% 10/2015. However, systolic function was reduced on LV gram (25-25%).Subsequent echo showed EF 50-55% with posterior hypokinesis. BP too low for ACE/ARB. He has been on 37m Lasix daily. He has been sleeping in a recliner and had LE edema. I will increase his Lasix to 429mdaily and get a BMET today and in 1 week.   Hypertensive heart disease: Blood pressure is well-controlled today. Home valsartan/HCTZ held due to hypotension. Continue metoprolol 2538mID for now.  Hyperlipidemia: cont lovastatin 88m41mulmonary HTN: PA pk pressure 70 by most recent 2D ECHO. Likely due to ILD/pulmonary fibrosis. He has declined further work up with pulm. Will increase lasix as above.  AKI: likely due to dehydration. Creat 1.4 on admission and returned to normal by discharge (1.08). Repeat BMET today  Carotid bruit: will get carotid dopplers  Current medicines are reviewed at length with the patient  today.  The patient does not have concerns regarding medicines.  The following changes have been made:  Increase lasix to 40mg67mly.   Labs/ tests ordered today include:   Orders Placed This Encounter  Procedures  . Basic Metabolic Panel (BMET)  . Basic Metabolic Panel (BMET)     Disposition:   FU with Dr. HardiEllyn Hack months .  SigneRenea Ee/2017 11:59 AM    [facility] [address] [id number]: [phone]: [phone]

## 2016-02-05 ENCOUNTER — Encounter: Payer: Self-pay | Admitting: Physician Assistant

## 2016-02-05 ENCOUNTER — Ambulatory Visit (INDEPENDENT_AMBULATORY_CARE_PROVIDER_SITE_OTHER): Payer: Medicare Other | Admitting: Physician Assistant

## 2016-02-05 VITALS — BP 112/60 | HR 56 | Ht 73.0 in | Wt 194.0 lb

## 2016-02-05 DIAGNOSIS — R0902 Hypoxemia: Secondary | ICD-10-CM | POA: Diagnosis not present

## 2016-02-05 DIAGNOSIS — R0609 Other forms of dyspnea: Secondary | ICD-10-CM | POA: Diagnosis not present

## 2016-02-05 DIAGNOSIS — E785 Hyperlipidemia, unspecified: Secondary | ICD-10-CM

## 2016-02-05 DIAGNOSIS — Z951 Presence of aortocoronary bypass graft: Secondary | ICD-10-CM

## 2016-02-05 DIAGNOSIS — I213 ST elevation (STEMI) myocardial infarction of unspecified site: Secondary | ICD-10-CM | POA: Diagnosis not present

## 2016-02-05 DIAGNOSIS — R0989 Other specified symptoms and signs involving the circulatory and respiratory systems: Secondary | ICD-10-CM

## 2016-02-05 LAB — BASIC METABOLIC PANEL
BUN: 24 mg/dL (ref 7–25)
CALCIUM: 9.4 mg/dL (ref 8.6–10.3)
CO2: 27 mmol/L (ref 20–31)
Chloride: 99 mmol/L (ref 98–110)
Creat: 1.21 mg/dL — ABNORMAL HIGH (ref 0.70–1.18)
Glucose, Bld: 99 mg/dL (ref 65–99)
POTASSIUM: 5.3 mmol/L (ref 3.5–5.3)
SODIUM: 138 mmol/L (ref 135–146)

## 2016-02-05 MED ORDER — FUROSEMIDE 40 MG PO TABS: 40.0000 mg | ORAL_TABLET | Freq: Every day | ORAL | Status: DC

## 2016-02-05 NOTE — Patient Instructions (Addendum)
Medication Instructions:  Your physician has recommended you make the following change in your medication:  1.  INCREASE the Lasix to 40 mg taking 1 tablet every morning (you can take 2 of the 20 mg tablets in the mornings until you use them all)  Labwork: TODAY: BMET 1 WEEK (02/12/16) BMET  Testing/Procedures: Your physician has requested that you have a carotid duplex. This test is an ultrasound of the carotid arteries in your neck. It looks at blood flow through these arteries that supply the brain with blood. Allow one hour for this exam. There are no restrictions or special instructions.    Follow-Up: Your physician recommends that you schedule a follow-up appointment in: 3 MONTHS WITH DR. HARDING   Any Other Special Instructions Will Be Listed Below (If Applicable).   If you need a refill on your cardiac medications before your next appointment, please call your pharmacy.

## 2016-02-06 ENCOUNTER — Telehealth: Payer: Self-pay | Admitting: *Deleted

## 2016-02-06 DIAGNOSIS — I213 ST elevation (STEMI) myocardial infarction of unspecified site: Secondary | ICD-10-CM

## 2016-02-06 DIAGNOSIS — Z951 Presence of aortocoronary bypass graft: Secondary | ICD-10-CM

## 2016-02-06 DIAGNOSIS — E785 Hyperlipidemia, unspecified: Secondary | ICD-10-CM

## 2016-02-06 DIAGNOSIS — R0609 Other forms of dyspnea: Secondary | ICD-10-CM

## 2016-02-06 DIAGNOSIS — R0902 Hypoxemia: Secondary | ICD-10-CM

## 2016-02-06 MED ORDER — FUROSEMIDE 20 MG PO TABS: 20.0000 mg | ORAL_TABLET | Freq: Every day | ORAL | Status: DC

## 2016-02-06 NOTE — Telephone Encounter (Signed)
Pt aware of lab results and to decrease the Lasix back to 20 mg daily and 20 mg prn as needed for swelling.  Pt verbalized understanding.

## 2016-02-06 NOTE — Telephone Encounter (Signed)
-----  Message from Eileen Stanford, [place] sent at 02/05/2016  9:54 PM EST ----- His kidney function looks a little on the dry side. Instead of 61m lasix everyday, lets go back to 271mevery day and he can take an extra only if he has swelling.

## 2016-02-07 ENCOUNTER — Ambulatory Visit (HOSPITAL_COMMUNITY)
Admission: RE | Admit: 2016-02-07 | Discharge: 2016-02-07 | Disposition: A | Discharge status: Home / Self Care | Payer: Medicare Other | Source: Ambulatory Visit | Attending: Physician Assistant | Admitting: Physician Assistant

## 2016-02-07 DIAGNOSIS — R0989 Other specified symptoms and signs involving the circulatory and respiratory systems: Secondary | ICD-10-CM | POA: Insufficient documentation

## 2016-02-07 DIAGNOSIS — I6523 Occlusion and stenosis of bilateral carotid arteries: Secondary | ICD-10-CM | POA: Diagnosis not present

## 2016-02-07 DIAGNOSIS — I10 Essential (primary) hypertension: Secondary | ICD-10-CM | POA: Insufficient documentation

## 2016-02-07 DIAGNOSIS — E785 Hyperlipidemia, unspecified: Secondary | ICD-10-CM | POA: Insufficient documentation

## 2016-02-12 ENCOUNTER — Other Ambulatory Visit (INDEPENDENT_AMBULATORY_CARE_PROVIDER_SITE_OTHER): Payer: Medicare Other | Admitting: *Deleted

## 2016-02-12 ENCOUNTER — Other Ambulatory Visit: Payer: Medicare Other

## 2016-02-12 DIAGNOSIS — R0902 Hypoxemia: Secondary | ICD-10-CM

## 2016-02-12 DIAGNOSIS — Z951 Presence of aortocoronary bypass graft: Secondary | ICD-10-CM

## 2016-02-12 DIAGNOSIS — I213 ST elevation (STEMI) myocardial infarction of unspecified site: Secondary | ICD-10-CM

## 2016-02-12 DIAGNOSIS — E785 Hyperlipidemia, unspecified: Secondary | ICD-10-CM

## 2016-02-12 DIAGNOSIS — R0609 Other forms of dyspnea: Secondary | ICD-10-CM

## 2016-02-12 LAB — BASIC METABOLIC PANEL
BUN: 27 mg/dL — AB (ref 7–25)
CHLORIDE: 99 mmol/L (ref 98–110)
CO2: 29 mmol/L (ref 20–31)
CREATININE: 1.33 mg/dL — AB (ref 0.70–1.18)
Calcium: 9.5 mg/dL (ref 8.6–10.3)
GLUCOSE: 91 mg/dL (ref 65–99)
Potassium: 4.7 mmol/L (ref 3.5–5.3)
Sodium: 138 mmol/L (ref 135–146)

## 2016-02-13 ENCOUNTER — Telehealth: Payer: Self-pay | Admitting: *Deleted

## 2016-02-13 DIAGNOSIS — I10 Essential (primary) hypertension: Secondary | ICD-10-CM

## 2016-02-13 NOTE — Telephone Encounter (Signed)
Pt aware of lab results and the need to repeat bmet in 1 week. Pt verbalized understanding. Order put in epic.

## 2016-02-13 NOTE — Telephone Encounter (Signed)
-----  Message from Eileen Stanford, PA-C sent at 02/13/2016  1:27 PM EST ----- Creat a little higher than last week. May be new baseline on lasix. Lets recheck BMET in 1 more week to ensure that it doesn't go up any further. Continue only lasix 50m daily

## 2016-02-18 ENCOUNTER — Encounter: Payer: Self-pay | Admitting: Neurology

## 2016-02-18 ENCOUNTER — Ambulatory Visit (INDEPENDENT_AMBULATORY_CARE_PROVIDER_SITE_OTHER): Payer: Medicare Other | Admitting: Neurology

## 2016-02-18 VITALS — BP 132/73 | HR 65 | Resp 18 | Ht 73.0 in | Wt 194.0 lb

## 2016-02-18 DIAGNOSIS — J849 Interstitial pulmonary disease, unspecified: Secondary | ICD-10-CM

## 2016-02-18 DIAGNOSIS — J9611 Chronic respiratory failure with hypoxia: Secondary | ICD-10-CM | POA: Diagnosis not present

## 2016-02-18 DIAGNOSIS — I214 Non-ST elevation (NSTEMI) myocardial infarction: Secondary | ICD-10-CM | POA: Diagnosis not present

## 2016-02-18 DIAGNOSIS — R0683 Snoring: Secondary | ICD-10-CM | POA: Diagnosis not present

## 2016-02-18 NOTE — Progress Notes (Addendum)
Subjective:    Patient ID: Steven Cameron is a 81 y.o. male.  HPI     Steven Age, MD, PhD [facility] [address]O. [place], [place] [address]  Dear Steven Cameron,   I saw your patient, Steven Cameron, upon your kind request in my neurologic clinic today for initial consultation of his sleep disorder, in particular, concern for underlying obstructive sleep apnea. The patient is accompanied by his wife today. As you know, Steven Cameron is a 81 year old right-handed gentleman with a complex medical history of hyperlipidemia (statin intolerance), coronary artery disease, status post CABG in 2004, ILD/pulmonary fibrosis on oxygen with moderate to severe pulmonary hypertension, systemic hypertension, reflux disease, peripheral neuropathy and gait disorder (followed by Dr. Jannifer Cameron and Steven Givens, NP), carotid bruit, recent hospitalization in November 2016 for acute hypoxic respiratory failure, who reports difficulty breathing at night and drops in his oxygen level at night. He was in the hospital from 01/26/16 to 01/29/16 for chest pain, and he was diagnosed with a Non-STEMI. He snores, but mildly, had been sleeping in a recliner for months, reports leg pain and not being comfortable. He has to use oxygen 24/7 at 3 lpm.  He quit smoking nearly 30 years ago. He does not drink alcohol, drinks coffee 1-4 cups per day.    I reviewed your office note from 01/14/2016, which you kindly included.  He goes to bed between 5 and 6 PM usually. He sleeps in his recliner. His wife sleeps in the bedroom. He snores mildly, but reports waking up with a sense of gasping. He has nocturia, at least 3 times a night. He has discomfort in his legs but denies restless leg symptoms. His Epworth sleepiness score is 4 out of 24, his fatigue score is 63 out of 63. He is not aware of any family history of OSA but there is family history of interstitial lung disease on his mother's side. He  had a tonsillectomy as a child. His wake up time is early, usually he is up between 3 and 4 AM. He feels that he wakes up with a sense of gasping despite using oxygen at night.  I reviewed your office note from 01/14/2016, which you kindly included.   His Past Medical History Is Significant For: Past Medical History  Diagnosis Date  . Arthritis   . Gout   . GERD (gastroesophageal reflux disease)   . Hypertension   . Hyperlipidemia   . Neuropathy (HCC)     In both legs, below knee  . Depression   . Anxiety disorder   . Prostate cancer [facility])     Prostate  . Peripheral neuropathy (HCC)     Probable  . Coronary artery disease     a. s/p CABG in 2004 b. 12/2015 NSTEMI s/p PCI/DES to SVG--> RCA  . Foot drop, bilateral 03/22/2013    His Past Surgical History Is Significant For: Past Surgical History  Procedure Laterality Date  . Cardiac catheterization  09/24/2004    EF 60%  . Coronary artery bypass graft  2004  . US echocardiography  01/19/2008    EF 55-60%  . Cardiovascular stress test  07/17/2010    EF 61%  . Radium seed implant      Prostate cancer  . Tonsillectomy    . Hemorrhoid surgery    . Cataract extraction, bilateral    . Cardiac catheterization N/A 01/27/2016    Procedure: Left Heart Cath and Cors/Grafts Angiography;  Surgeon: Leonie Man, MD;  Location: [facility];  Service: Cardiovascular;  Laterality: N/A;  . Cardiac catheterization N/A 01/27/2016    Procedure: Coronary Stent Intervention;  Surgeon: Leonie Man, MD;  Location: [facility];  Service: Cardiovascular;  Laterality: N/A;    His Family History Is Significant For: Family History  Problem Relation Cameron of Onset  . Alzheimer's disease Mother 7  . Coronary artery disease Father   . Prostate cancer Father     His Social History Is Significant For: Social History   Social History  . Marital Status: Married    Spouse Name: N/A  . Number of Children: 2  . Years of Education: 14    Occupational History  . Retired     retired   Social History Main Topics  . Smoking status: Former Smoker -- 3.00 packs/day for 25 years    Types: Cigarettes    Quit date: 08/01/1991  . Smokeless tobacco: Never Used  . Alcohol Use: No  . Drug Use: No  . Sexual Activity: Not Asked   Other Topics Concern  . None   Social History Narrative   Patient lives at home with his wife Steven Cameron).   Retired.    Education some college.Right handed.   Caffeine coffee four cups .        His Allergies Are:  Allergies  Allergen Reactions  . Lyrica [Pregabalin]     Gait instability  . Statins Other (See Comments)    Weakness of legs  . Ibuprofen Rash  :   His Current Medications Are:  Outpatient Encounter Prescriptions as of 02/18/2016  Medication Sig  . allopurinol (ZYLOPRIM) 100 MG tablet Take 1 tablet (100 mg total) by mouth 2 (two) times daily.  Marland Kitchen ALPRAZolam (XANAX) 0.5 MG tablet Take 0.025 mg by mouth 3 (three) times daily as needed for anxiety (anxiety).   Marland Kitchen aspirin 81 MG EC tablet Take 81 mg by mouth daily.    . B Complex-C (SUPER B COMPLEX PO) Take 1 tablet by mouth daily.  . cholecalciferol (VITAMIN D) 1000 UNITS tablet Take 1,000 Units by mouth daily.  . furosemide (LASIX) 20 MG tablet Take 1 tablet (20 mg total) by mouth daily.  Marland Kitchen HYDROcodone-acetaminophen (NORCO/VICODIN) 5-325 MG per tablet Take 0.5 tablets by mouth every 6 (six) hours as needed for moderate pain (pain).   Marland Kitchen lovastatin (MEVACOR) 20 MG tablet Take 1 tablet (20 mg total) by mouth at bedtime.  . metoprolol tartrate (LOPRESSOR) 25 MG tablet Take 1 tablet (25 mg total) by mouth 2 (two) times daily.  . Misc Natural Products (OSTEO BI-FLEX JOINT SHIELD PO) Take 1 tablet by mouth 2 (two) times daily.   . nitroGLYCERIN (NITROSTAT) 0.4 MG SL tablet Place 0.4 mg under the tongue every 5 (five) minutes as needed for chest pain (x 3 doses). For chest pain  . omega-3 acid ethyl esters (LOVAZA) 1 G capsule Take 1 g by  mouth 3 (three) times daily.  Marland Kitchen omeprazole (PRILOSEC) 20 MG capsule Take 20 mg by mouth 2 (two) times daily.  . potassium chloride SA (K-DUR,KLOR-CON) 20 MEQ tablet TAKE 1 TABLET BY MOUTH DAILY  . Red Yeast Rice 600 MG CAPS Take 600 mg by mouth 2 (two) times daily.  . ticagrelor (BRILINTA) 90 MG TABS tablet Take 1 tablet (90 mg total) by mouth 2 (two) times daily.   No facility-administered encounter medications on file as of 02/18/2016.  :  Review of Systems:  Out of a complete 14 point review of systems, all are reviewed and negative with the exception of these symptoms as listed below:   Review of Systems  Respiratory: Positive for shortness of breath.   Cardiovascular: Positive for leg swelling.  Musculoskeletal:       Joint pain and swelling, cramps, aching muscles.   Neurological:       Gets up many times during the night, snoring,   Hematological: Bruises/bleeds easily.  Psychiatric/Behavioral:       Depression and anxiety   Epworth Sleepiness Scale 0= would never doze 1= slight chance of dozing 2= moderate chance of dozing 3= high chance of dozing  Sitting and reading:2 Watching TV:0 Sitting inactive in a public place (ex. Theater or meeting):0 As a passenger in a car for an hour without a break:0 Lying down to rest in the afternoon:2 Sitting and talking to someone:0 Sitting quietly after lunch (no alcohol):0 In a car, while stopped in traffic:0 Total:4  Objective:  Neurologic Exam  Physical Exam Physical Examination:   Filed Vitals:   02/18/16 1433  BP: 132/73  Pulse: 65  Resp: 18    General Examination: The patient is a very pleasant 81 y.o. male in no acute distress. He appears well-developed and well-nourished and well groomed. He is on oxygen via nasal cannula and portable oxygen canister, 3 L/m.  HEENT: Normocephalic, atraumatic, pupils are equal, round and reactive to light and accommodation. Funduscopic exam is normal with sharp disc margins noted.  Extraocular tracking is good without limitation to gaze excursion or nystagmus noted. Normal smooth pursuit is noted. Hearing is grossly intact. Tympanic membranes are clear bilaterally. Face is symmetric with normal facial animation and normal facial sensation. Speech is clear with no dysarthria noted. There is no hypophonia. There is no lip, neck/head, jaw or voice tremor. Neck is supple with full range of passive and active motion. There are no carotid bruits on auscultation. Oropharynx exam reveals: moderate mouth dryness, adequate dental hygiene with partials on top and missing teeth on the bottom and moderate airway crowding, due to larger tongue and redundant soft palate. Mallampati is class II. Tongue protrudes centrally and palate elevates symmetrically. Tonsils are absent. Neck size is 16.25 inches.   Chest: Clear to auscultation without wheezing, rhonchi or crackles noted.  Heart: S1+S2+0, regular and normal without murmurs, rubs or gallops noted.   Abdomen: Soft, non-tender and non-distended with normal bowel sounds appreciated on auscultation.  Extremities: There is 1+ pitting edema in the distal lower extremities bilaterally. Pedal pulses are intact.  Skin: Warm and dry without trophic changes noted. There are no varicose veins. He has chronic appearing hyperpigmentation in the distal lower extremities bilaterally.   Musculoskeletal: exam reveals no obvious joint deformities, tenderness or joint swelling or erythema.   Neurologically:  Mental status: The patient is awake, alert and oriented in all 4 spheres. His immediate and remote memory, attention, language skills and fund of knowledge are appropriate. There is no evidence of aphasia, agnosia, apraxia or anomia. Speech is clear with normal prosody and enunciation. Thought process is linear. Mood is normal and affect is normal.  Cranial nerves II - XII are as described above under HEENT exam. In addition: shoulder shrug is normal with  equal shoulder height noted. Motor exam: Normal bulk, global strength of 4+ out of 5 with more significant weakness noted in foot dorsiflexion bilaterally, right more than left. There is no drift, tremor or rebound. Romberg is  not possible. Reflexes  are 1+ in the upper extremities and diminished in the lower extremities. Fine motor skills and coordination:  globally impaired.  Cerebellar testing: No dysmetria or intention tremor on finger to nose testing. Heel to shin is not possible for him.   Sensory exam:  decreased in the lower extremities.  Gait, station and balance: He stands with difficulty. No veering to one side is noted. He walks slowly. He has to use a cane. Tandem walk is not possible.  Assessment and Plan:  In summary, Steven Cameron is a very pleasant 82 y.o.-year old male with a complex medical history of hyperlipidemia (statin intolerance), coronary artery disease, status post CABG in 2004, ILD/pulmonary fibrosis on oxygen 24/7 at 3 lpm, with moderate to severe pulmonary hypertension, systemic hypertension, reflux disease, peripheral neuropathy, recent non-STEMI in 1/17, and gait disorder (followed by Dr. Jannifer Cameron and Steven Givens, NP), carotid bruit, s/p hospitalization in November 2016 for acute hypoxic respiratory failure, who reports difficulty breathing at night and drops in his oxygen level at night. He has a pulse oximeter as I understand. He sleeps in a recliner.  His history and physical exam are somewhat concerning for obstructive sleep apnea (OSA), however, the O2 drops may be d/t underlying lung d/s. I had a long chat with the patient and his wife about my findings and the diagnosis of OSA, its prognosis and treatment options. We talked about medical treatments, surgical interventions and non-pharmacological approaches. I explained in particular the risks and ramifications of untreated moderate to severe OSA, especially with respect to developing cardiovascular disease down  the Road, including congestive heart failure, difficult to treat hypertension, cardiac arrhythmias, or stroke. Even type 2 diabetes has, in part, been linked to untreated OSA. Symptoms of untreated OSA include daytime sleepiness, memory problems, mood irritability and mood disorder such as depression and anxiety, lack of energy, as well as recurrent headaches, especially morning headaches. We talked about trying to maintain a healthy lifestyle in general, as well as the importance of weight control. I encouraged the patient to eat healthy, exercise daily and keep well hydrated, to keep a scheduled bedtime and wake time routine, to not skip any meals and eat healthy snacks in between meals. I advised the patient not to drive when feeling sleepy. I recommended the following at this time: sleep study with potential positive airway pressure titration. (We will score hypopneas at 4% and split the sleep study into diagnostic and treatment portion, if the estimated. 2 hour AHI is >15/h).  We will maintain his nocturnal O2 at 3 lpm for the study.  I explained the sleep test procedure to the patient and also outlined possible surgical and non-surgical treatment options of OSA, including the use of a custom-made dental device (which would require a referral to a specialist dentist or oral surgeon), upper airway surgical options, such as pillar implants, radiofrequency surgery, tongue base surgery, and UPPP (which would involve a referral to an ENT surgeon, but he is not a good surgical candidate). Rarely, jaw surgery such as mandibular advancement may be considered (several missing teeth however).  I also explained the CPAP treatment option to the patient, who indicated that he would be willing to try CPAP if the need arises. I explained the importance of being compliant with PAP treatment, not only for insurance purposes but primarily to improve His symptoms, and for the patient's long term health benefit, including to  reduce His cardiovascular risks. I answered all their questions today and the patient and  his wife were in agreement. I would like to see him back after the sleep study is completed and encouraged him to call with any interim questions, concerns, problems or updates.   Thank you very much for allowing me to participate in the care of this nice patient. If I can be of any further assistance to you please do not hesitate to call me at [phone].  Sincerely,   Steven Age, MD, PhD

## 2016-02-18 NOTE — Patient Instructions (Signed)

## 2016-02-20 ENCOUNTER — Other Ambulatory Visit (INDEPENDENT_AMBULATORY_CARE_PROVIDER_SITE_OTHER): Payer: Medicare Other | Admitting: *Deleted

## 2016-02-20 DIAGNOSIS — I10 Essential (primary) hypertension: Secondary | ICD-10-CM | POA: Diagnosis not present

## 2016-02-20 LAB — BASIC METABOLIC PANEL
BUN: 25 mg/dL (ref 7–25)
CALCIUM: 9.4 mg/dL (ref 8.6–10.3)
CO2: 26 mmol/L (ref 20–31)
Chloride: 99 mmol/L (ref 98–110)
Creat: 1.33 mg/dL — ABNORMAL HIGH (ref 0.70–1.18)
Glucose, Bld: 53 mg/dL — ABNORMAL LOW (ref 65–99)
Potassium: 4.7 mmol/L (ref 3.5–5.3)
SODIUM: 139 mmol/L (ref 135–146)

## 2016-02-27 ENCOUNTER — Ambulatory Visit (INDEPENDENT_AMBULATORY_CARE_PROVIDER_SITE_OTHER): Payer: Medicare Other | Admitting: Neurology

## 2016-02-27 DIAGNOSIS — G478 Other sleep disorders: Secondary | ICD-10-CM

## 2016-02-27 DIAGNOSIS — G4720 Circadian rhythm sleep disorder, unspecified type: Secondary | ICD-10-CM

## 2016-02-27 DIAGNOSIS — R9431 Abnormal electrocardiogram [ECG] [EKG]: Secondary | ICD-10-CM

## 2016-02-27 DIAGNOSIS — G4761 Periodic limb movement disorder: Secondary | ICD-10-CM

## 2016-02-27 DIAGNOSIS — R351 Nocturia: Secondary | ICD-10-CM

## 2016-02-27 DIAGNOSIS — J849 Interstitial pulmonary disease, unspecified: Secondary | ICD-10-CM

## 2016-02-27 DIAGNOSIS — G479 Sleep disorder, unspecified: Secondary | ICD-10-CM

## 2016-02-27 DIAGNOSIS — Z9981 Dependence on supplemental oxygen: Secondary | ICD-10-CM

## 2016-02-28 ENCOUNTER — Telehealth: Payer: Self-pay | Admitting: Neurology

## 2016-02-28 NOTE — Telephone Encounter (Signed)
i faxed copy of report to referring provider.

## 2016-02-28 NOTE — Telephone Encounter (Signed)
I spoke to patient and he is aware of result and recommendations. He has refused f/u appt at this time. He states that he was more concerned about his oxygen. I advised him to call us back if needed.

## 2016-02-28 NOTE — Telephone Encounter (Signed)
Patient referred by Ms. Heide Scales, seen by me on 02/18/16, diagnostic PSG on 02/27/16.   Please call and notify the patient that the recent sleep study did not show any significant obstructive sleep apnea. Oxygen level appropriate with his home O2 at 3 lpm. However, sleep was fragmented, no deep sleep, no dream sleep, lot of PMLs with some arousals. He needs to FU with pulmonology and I would like to go over the details of the study during a follow up appointment, in particular talk to him about RLS symptoms and PLMs. Arrange a followup appointment. Also, route or fax report to PCP and referring MD, if other than PCP.  Once you have spoken to patient, you can close this encounter.   Thanks,  [name], MD, PhD [facility])

## 2016-02-28 NOTE — Sleep Study (Signed)
Please see the scanned sleep study interpretation located in the procedure tab within the chart review section.   

## 2016-04-03 ENCOUNTER — Ambulatory Visit: Payer: Medicare Other | Admitting: Physician Assistant

## 2016-04-03 ENCOUNTER — Other Ambulatory Visit: Payer: Medicare Other

## 2016-05-05 ENCOUNTER — Encounter: Payer: Self-pay | Admitting: Cardiology

## 2016-05-05 ENCOUNTER — Ambulatory Visit (INDEPENDENT_AMBULATORY_CARE_PROVIDER_SITE_OTHER): Payer: Medicare Other | Admitting: Cardiology

## 2016-05-05 VITALS — BP 140/64 | HR 48 | Ht 73.0 in | Wt 194.0 lb

## 2016-05-05 DIAGNOSIS — R0609 Other forms of dyspnea: Secondary | ICD-10-CM

## 2016-05-05 DIAGNOSIS — I25119 Atherosclerotic heart disease of native coronary artery with unspecified angina pectoris: Secondary | ICD-10-CM

## 2016-05-05 DIAGNOSIS — I272 Other secondary pulmonary hypertension: Secondary | ICD-10-CM | POA: Diagnosis not present

## 2016-05-05 DIAGNOSIS — I119 Hypertensive heart disease without heart failure: Secondary | ICD-10-CM | POA: Diagnosis not present

## 2016-05-05 DIAGNOSIS — I25708 Atherosclerosis of coronary artery bypass graft(s), unspecified, with other forms of angina pectoris: Secondary | ICD-10-CM | POA: Diagnosis not present

## 2016-05-05 DIAGNOSIS — I10 Essential (primary) hypertension: Secondary | ICD-10-CM

## 2016-05-05 DIAGNOSIS — I214 Non-ST elevation (NSTEMI) myocardial infarction: Secondary | ICD-10-CM | POA: Diagnosis not present

## 2016-05-05 DIAGNOSIS — I739 Peripheral vascular disease, unspecified: Secondary | ICD-10-CM

## 2016-05-05 DIAGNOSIS — I25811 Atherosclerosis of native coronary artery of transplanted heart without angina pectoris: Secondary | ICD-10-CM | POA: Insufficient documentation

## 2016-05-05 DIAGNOSIS — E785 Hyperlipidemia, unspecified: Secondary | ICD-10-CM

## 2016-05-05 DIAGNOSIS — I779 Disorder of arteries and arterioles, unspecified: Secondary | ICD-10-CM

## 2016-05-05 MED ORDER — NITROGLYCERIN 0.4 MG SL SUBL: 0.4000 mg | SUBLINGUAL_TABLET | SUBLINGUAL | Status: AC | PRN

## 2016-05-05 MED ORDER — LOSARTAN POTASSIUM-HCTZ 100-12.5 MG PO TABS: 0.5000 | ORAL_TABLET | Freq: Every day | ORAL | Status: DC

## 2016-05-05 NOTE — Assessment & Plan Note (Signed)
He really doesn't have much the way of any heart failure associated with his hypertensive heart disease and diastolic dysfunction. He does have some intermittent edema for which he takes Lasix. He only takes 20 mg daily and is not having to require any additional dosing. We did double up when necessary dosing.  His blood pressure is not quite relatively today. I will restart him on one half of his prior losartan-ACTZ dose. We'll see how his blood pressure doesn't follow up with potentially go back to his original dose.

## 2016-05-05 NOTE — Assessment & Plan Note (Signed)
Known severe diffuse native coronary disease. Basically graft dependent. Following PCI of the SVG-RI-OM, now all grafts are patent. No active anginal symptoms. Remains on moderate dose lovastatin along with beta blocker. Is on aspirin plus Brilinta for stents.

## 2016-05-05 NOTE — Assessment & Plan Note (Signed)
He had lesions in both the primary and sequential limb of the vein graft treated with stents. Excellent results. EF is improved since PCI based on echo. He is not having any further anginal symptoms. Remains on aspirin plus Brilinta. No bleeding issues.  He really doesn't do enough exertion to note any exertional angina, but what he is able to do he is not noting any symptoms. He also has not had any of his MI type symptoms.

## 2016-05-05 NOTE — Assessment & Plan Note (Signed)
No longer on Lipitor. Now on losartan. Monitored by PCP. I have not seen any recent labs. He also takes Lovaza. He has had myalgias with atorvastatin and simvastatin in the past. His lipids are pretty stable. He is due for follow-up now.

## 2016-05-05 NOTE — Progress Notes (Signed)
PCP: Steven Riches, NP, [facility].  Clinic Note: Chief Complaint  Patient presents with  . Follow-up    Dizziness & lightheadedness  . Coronary Artery Disease    HPI: Steven Cameron is a 81 y.o. male with a PMH below who presents today for 3 month f/u. He is a former patient of Dr. Mare Cameron - who last saw him in October 2016. CAD h/o: CABG in 2004(SVG-RI-CxOM, SVG-D2, SVG-dRCA, LIMA-LAD), With an inferior STEMI in January 2017 with PCI to the SVG-RCA & DHF He also has a history of ILD/pulmonary fibrosis on 02, HTN, ulm HLD, peripheral neuropathy, and carotid bruits  He has a history of HLD, intolerant to statins previously on low-dose of lovastatin 44m and also red yeast rice.    admitted in 10/2015 for acute hypoxic respiratory failure and CT was suspicious for ILD/pulm fibrosis. He was discharged on home oxygen. It was recommended that he follow up with pulmonology but he refused further work up.   Admitted 01/25/2016 with acute coronary syndrome and significant ST depressions in anterior leads with elevations in inferior leads suggestive of inferior STEMI --> however chest pain resolved with nitroglycerin and EKG changes returned to baseline. He was therefore admitted for precath hydration.  Cardiac catheterization 01/27/2016  Conclusion     Ost LM to LM lesion, 99% stenosed. Ost LAD to Prox LAD lesion, 100% stenosed. Mid LAD lesion, 100% stenosed after grafed ~D2. Ost Ramus lesion, 100% stenosed. Ost Cx lesion, 100% stenosed.  Ost RCA to Prox RCA lesion, 80% stenosed. Dist RCA lesion, 70% stenosed. Ost RPDA lesion, 60% stenosed.  SVG-RI-OM: Mid Graft lesion, before Ramus, 100% stenosed. PCI with Promus DES stent (3.5 mm x 32 mm, 3.8 mm). Post intervention, there is a 0% residual stenosis.  SVG-RI-OM mitral limb of Graft lesion, between Ramus and 1st Mrg, 80% stenosed. PCI was second Promus DES stent (3.5 mm x 12 mm, 3.7 mm) . Post intervention, there is a  0% residual stenosis.  Patent SVG-D2, SVG-dRCA, LIMA-LAD.  There is moderate to severe left ventricular systolic dysfunction.  Moderate-severely elevated LVEDP               Echocardiogram 01/28/2016: EF improved to 50-55% with GR 1 DD. Mild MR. Mild/moderate RV dilation. Cameron pressures Estimated 70 mmHg. (previous echocardiographic evaluation is suggested grade 2 diastolic dysfunction with mild MR.)   PSH/PMH updated  Steven BROUGHTONwas last seen on 02/05/2016 by Steven Cameron. He was noted to be relatively stable. No CP . He has chronic dyspnea and on continuous 02. He has had some LE edema which is a little better today. He has been sleeping in a recliner for months. Some PND. He was supposed to be having a sleep study done through his PCP. No dizziness or passing out.   Interval History: CCobenpresents today again doing well without any major complaints. The biggest issue with him as it is limited by his osteoarthritis pains. Accommodation between arthritis pains has baseline dyspnea make it so he really can't do much of any exertion. He has really declined pulmonary follow-up, because he really doesn't have any additional tests done. He knows he can do a 6 minute walk because of his arthritis. Steven Comesfrom a cardiac standpoint besides the baseline dyspnea, he denies any PND or orthopnea. No resting or exertional chest tightness or pressure. No edema.  Remainder his cardiac arrest review of symptoms as follows: No palpitations, lightheadedness, dizziness, weakness or syncope/near syncope. No  TIA/amaurosis fugax symptoms. No melena, hematochezia, hematuria, or epstaxis. No claudication.  We had a long talk about reevaluating echocardiogram, continued follow-up with pulmonary medicine, etc. He basically is reluctant to do any significant tests about his lung disease. He will treated with oxygen, but does not feel like significant testing is needed to be done. He wears home oxygen  and has his baseline dyspnea, but is also significantly limited by his back and leg pain from osteoarthritis.  ROS: A comprehensive was performed. Review of Systems  Constitutional: Negative for weight loss and malaise/fatigue.  HENT: Negative for congestion.   Respiratory: Positive for shortness of breath and wheezing. Negative for cough.        Baseline lung disease on oxygen.  Cardiovascular: Negative for claudication.  Gastrointestinal: Negative for heartburn, constipation, blood in stool and melena.  Genitourinary: Negative.  Negative for hematuria.  Musculoskeletal: Positive for back pain and joint pain. Negative for falls.       Severe back, hip and knee arthritis  Neurological: Negative.  Negative for dizziness, sensory change, speech change, loss of consciousness and headaches.  Psychiatric/Behavioral: Negative for depression and memory loss. The patient is not nervous/anxious and does not have insomnia.     Past Medical History  Diagnosis Date  . Arthritis   . Gout   . GERD (gastroesophageal reflux disease)   . Hypertension   . Hyperlipidemia   . Neuropathy (HCC)     In both legs, below knee  . Depression   . Anxiety disorder   . Prostate cancer [facility])     Prostate  . Peripheral neuropathy (HCC)     Probable  . Coronary artery disease involving native coronary artery with angina pectoris ([place]) 2004    a. s/p CABG in 2004 b. 12/2015 NSTEMI --> * 100% SVG-RI-OM (80% RI-OM limb). Ost LM 99%, ost-pLAD 100% &100% after grafted D2. Osti-RI  & ostCx100%. ost-pRCA 80%, dRCA 70%, Ostial rPDA 60%. SVG-dRCA, SVG-D2 and LIMA-LAD patent.   Mod-severe LV dysfunction w/ mod-severely elevated LVEDP.  Marland Kitchen Coronary artery disease involving autologous vein bypass graft 2017    Admitted for ACS/aborted inferior STEMI -- occluded SVG-RI-OM; PCI of proximal (Promus DES 3.5 x 32 -- 3.8 mm) and sequential limb from RI-OM (Promus DES 3.5 x 12--3.7 mm).    . Hypertensive heart disease with congestive  heart failure and stage 2 kidney disease ([place])   . Chronic diastolic heart failure secondary to coronary artery disease (HCC)     Elevated LVEDP noted on cardiac catheterization in setting of non-STEMI.  DD noted on echo January 2017  . Pulmonary fibrosis, unspecified ([place]) 2002    With interstitial lung disease.  . Pulmonary hypertension due to interstitial lung disease (HCC)     Cameron pressures by echocardiogram estimated at 70-78 mmHg. Is on home oxygen.  . Foot drop, bilateral 03/22/2013    Past Surgical History  Procedure Laterality Date  . Cardiac catheterization  09/24/2004    EF 60%  . Coronary artery bypass graft  2004  . US echocardiography  01/19/2008    EF 55-60%  . Cardiovascular stress test  07/17/2010    EF 61%  . Radium seed implant      Prostate cancer  . Tonsillectomy    . Hemorrhoid surgery    . Cataract extraction, bilateral    . Cardiac catheterization N/A 01/27/2016    Procedure: Left Heart Cath and Cors/Grafts Angiography;  Surgeon: Leonie Man, MD;  Location: [facility];  Service: CV; * 100% SVG-RI-OM (80% RI-OM limb). Ost LM 99%, ost-pLAD 100% &100% after grafted D2. Osti-RI  & ostCx100%. ost-pRCA 80%, dRCA 70%, Ostial rPDA 60%. SVG-dRCA, SVG-D2 and LIMA-LAD patent.   Mod-severe LV dysfunction w/ mod-severely elevated LVEDP.  . Cardiac catheterization N/A 01/27/2016    Procedure: Coronary Stent Intervention;  Surgeon: Leonie Man, MD;  Location: [facility];  Service: Cardiovascular;  occluded SVG-RI-OM; PCI of proximal (Promus DES 3.5 mm x 32 mm -- 3.8 mm) & seq limb from RI-OM (Promus DES 3.62m x 153m-3.7 mm)  . Transthoracic echocardiogram  Nov 2016; Jan 2017    a. Normal LV size, function, EF 60-65%.no RWMA. Gr 1 DD, mild LA dilation, mod RV dilation - PAP ~78 mmHg;;b.EF 50-55%. GR 1 DD. Mild MR. Mild-moderate RV dilation. PAP ~70 mmHg.    Prior to Admission medications   Medication Sig Start Date End Date Taking? Authorizing Provider    allopurinol (ZYLOPRIM) 100 MG tablet Take 1 tablet (100 mg total) by mouth 2 (two) times daily. 02/21/13  Yes ThDarlin CocoMD  ALPRAZolam (XDuanne Moron0.5 MG tablet Take 0.025 mg by mouth 3 (three) times daily as needed for anxiety (anxiety).    Yes Historical Provider, MD  aspirin 81 MG EC tablet Take 81 mg by mouth daily.     Yes Historical Provider, MD  B Complex-C (SUPER B COMPLEX PO) Take 1 tablet by mouth daily.   Yes Historical Provider, MD  cholecalciferol (VITAMIN D) 1000 UNITS tablet Take 1,000 Units by mouth daily.   Yes Historical Provider, MD  fluocinonide cream (LIDEX) 0.05 %  04/28/16  Yes Historical Provider, MD  furosemide (LASIX) 20 MG tablet Take 1 tablet (20 mg total) by mouth daily. 02/06/16  Yes KaEileen StanfordPA-C  HYDROcodone-acetaminophen (NORCO/VICODIN) 5-325 MG per tablet Take 0.5 tablets by mouth every 6 (six) hours as needed for moderate pain (pain).  09/20/13  Yes Historical Provider, MD  lovastatin (MEVACOR) 20 MG tablet Take 1 tablet (20 mg total) by mouth at bedtime. 01/29/16  Yes KaEileen StanfordPA-C  metoprolol tartrate (LOPRESSOR) 25 MG tablet Take 1 tablet (25 mg total) by mouth 2 (two) times daily. 01/29/16  Yes KaEileen StanfordPA-C  Misc Natural Products (OSTEO BI-FLEX JOINT SHIELD PO) Take 1 tablet by mouth 2 (two) times daily.    Yes Historical Provider, MD  nitroGLYCERIN (NITROSTAT) 0.4 MG SL tablet Place 0.4 mg under the tongue every 5 (five) minutes as needed for chest pain (x 3 doses). For chest pain   Yes ThDarlin CocoMD  omega-3 acid ethyl esters (LOVAZA) 1 G capsule Take 1 g by mouth 3 (three) times daily.   Yes Historical Provider, MD  omeprazole (PRILOSEC) 20 MG capsule Take 20 mg by mouth 2 (two) times daily.   Yes Historical Provider, MD  potassium chloride SA (K-DUR,KLOR-CON) 20 MEQ tablet TAKE 1 TABLET BY MOUTH DAILY 11/06/15  Yes ThDarlin CocoMD  Red Yeast Rice 600 MG CAPS Take 600 mg by mouth 2 (two) times daily.   Yes Historical  Provider, MD  ticagrelor (BRILINTA) 90 MG TABS tablet Take 1 tablet (90 mg total) by mouth 2 (two) times daily. 01/29/16  Yes KaEileen StanfordPA-C   Allergies  Allergen Reactions  . Lyrica [Pregabalin]     Gait instability  . Statins Other (See Comments)    Weakness of legs  . Ibuprofen Rash    Social History   Social History  .  Marital Status: Married    Spouse Name: N/A  . Number of Children: 2  . Years of Education: 14   Occupational History  . Retired     retired   Social History Main Topics  . Smoking status: Former Smoker -- 3.00 packs/day for 25 years    Types: Cigarettes    Quit date: 08/01/1991  . Smokeless tobacco: Never Used  . Alcohol Use: No  . Drug Use: No  . Sexual Activity: Not Asked   Other Topics Concern  . None   Social History Narrative   Patient lives at home with his wife Mikle Bosworth).   Retired.    Education some college.Right handed.   Caffeine coffee four cups .        family history includes Alzheimer's disease (age of onset: 43) in his mother; Coronary artery disease in his father; Prostate cancer in his father.   Wt Readings from Last 3 Encounters:  05/05/16 194 lb (87.998 kg)  02/18/16 194 lb (87.998 kg)  02/05/16 194 lb (87.998 kg)    PHYSICAL EXAM BP 140/64 mmHg  Pulse 48  Ht _0  (1.854 m)  Wt 194 lb (87.998 kg)  BMI 25.60 kg/m2 General appearance: alert, cooperative, appears stated age, no distress andWell-nourished, well-groomed. He is wearing home oxygen. Neck: no adenopathy, no carotid bruit and no JVD Lungs:non-labored, clear to percussion and diffuse interstitial lung sounds with fine diffuse crackles. Heart: regular rate and rhythm, S1 & S2 normal, no murmur, click, rub or gallop Abdomen: soft, non-tender; bowel sounds normal; no masses,  no organomegaly; Extremities: extremities normal, atraumatic, no cyanosis, and Trace edema Pulses: 2+ and symmetric;  Skin: mobility and turgor normal or  Neurologic: Mental  status: Alert, oriented, thought content appropriate Cranial nerves: normal (II-XII grossly intact)    Adult ECG Report  Rate: 48 ;  Rhythm: sinus bradycardia and Otherwise normal axis, intervals and durations. Normal voltage.;   Narrative Interpretation: Otherwise normal EKG.   Other studies Reviewed: Additional studies/ records that were reviewed today include:  Recent Labs:   Lab Results  Component Value Date   CHOL 148 10/04/2015   HDL 35* 10/04/2015   LDLCALC 89 10/04/2015   TRIG 119 10/04/2015   CHOLHDL 4.2 10/04/2015  - stable   ASSESSMENT / PLAN: Problem List Items Addressed This Visit    Non-ST elevation (NSTEMI) myocardial infarction (HCC)   Relevant Medications   losartan-hydrochlorothiazide (HYZAAR) 100-12.5 MG tablet   nitroGLYCERIN (NITROSTAT) 0.4 MG SL tablet   Moderate to severe pulmonary hypertension (HCC) (Chronic)   Relevant Medications   losartan-hydrochlorothiazide (HYZAAR) 100-12.5 MG tablet   nitroGLYCERIN (NITROSTAT) 0.4 MG SL tablet   Other Relevant Orders   EKG 12-Lead   Lipid panel   Comprehensive metabolic panel   Hypertensive heart disease (Chronic)    He really doesn't have much the way of any heart failure associated with his hypertensive heart disease and diastolic dysfunction. He does have some intermittent edema for which he takes Lasix. He only takes 20 mg daily and is not having to require any additional dosing. We did double up when necessary dosing.  His blood pressure is not quite relatively today. I will restart him on one half of his prior losartan-ACTZ dose. We'll see how his blood pressure doesn't follow up with potentially go back to his original dose.      Relevant Medications   losartan-hydrochlorothiazide (HYZAAR) 100-12.5 MG tablet   nitroGLYCERIN (NITROSTAT) 0.4 MG SL tablet   Other Relevant  Orders   EKG 12-Lead   Lipid panel   Comprehensive metabolic panel   Essential hypertension (Chronic)    Blood pressure is up  enough now I think we can probably start him back on ARB. We'll start him now at one half his original dose which was 160/12.5 mg of losartan/HCTZ.      Relevant Medications   losartan-hydrochlorothiazide (HYZAAR) 100-12.5 MG tablet   nitroGLYCERIN (NITROSTAT) 0.4 MG SL tablet   Other Relevant Orders   EKG 12-Lead   Lipid panel   Comprehensive metabolic panel   Dyslipidemia, goal LDL below 70 (Chronic)    No longer on Lipitor. Now on losartan. Monitored by PCP. I have not seen any recent labs. He also takes Lovaza. He has had myalgias with atorvastatin and simvastatin in the past. His lipids are pretty stable. He is due for follow-up now.      Relevant Medications   losartan-hydrochlorothiazide (HYZAAR) 100-12.5 MG tablet   nitroGLYCERIN (NITROSTAT) 0.4 MG SL tablet   DOE (dyspnea on exertion)    Clearly multifactorial. He has ILD with pulmonary hypertension on home oxygen. He also has some component of diastolic dysfunction exacerbating his existing pulmonary hypertension. We talked about the potential of going back to pulmonary medicine, and he is not really excited about the idea. He doesn't think he can even try to do a 6 minute walk. He is not interested in any invasive or noninvasive studies at this point. He will simply continue to [place] on with his oxygen. I don't think any of his dyspnea is related to angina, but could have some into hypertensive and coronary heart disease related diastolic dysfunction mediated dyspnea.      Coronary artery disease involving native heart with angina pectoris (HCC) (Chronic)    Known severe diffuse native coronary disease. Basically graft dependent. Following PCI of the SVG-RI-OM, now all grafts are patent. No active anginal symptoms. Remains on moderate dose lovastatin along with beta blocker. Is on aspirin plus Brilinta for stents.      Relevant Medications   losartan-hydrochlorothiazide (HYZAAR) 100-12.5 MG tablet   nitroGLYCERIN  (NITROSTAT) 0.4 MG SL tablet   CAD (coronary artery disease) of artery bypass graft - Primary (Chronic)    He had lesions in both the primary and sequential limb of the vein graft treated with stents. Excellent results. EF is improved since PCI based on echo. He is not having any further anginal symptoms. Remains on aspirin plus Brilinta. No bleeding issues.  He really doesn't do enough exertion to note any exertional angina, but what he is able to do he is not noting any symptoms. He also has not had any of his MI type symptoms.      Relevant Medications   losartan-hydrochlorothiazide (HYZAAR) 100-12.5 MG tablet   nitroGLYCERIN (NITROSTAT) 0.4 MG SL tablet   Other Relevant Orders   EKG 12-Lead   Lipid panel   Comprehensive metabolic panel   Bilateral carotid artery disease (HCC) (Chronic)   Relevant Medications   losartan-hydrochlorothiazide (HYZAAR) 100-12.5 MG tablet   nitroGLYCERIN (NITROSTAT) 0.4 MG SL tablet      Current medicines are reviewed at length with the patient today. (+/- concerns) None The following changes have been made: None  Restart losartan- hctz (hyzaar) 160/12.5  -take 1/2 tablet  Daily   Please have labs drawn--- lipids, CMP  Your physician wants you to follow-up in [place].  Studies Ordered:   Orders Placed This Encounter  Procedures  .  Lipid panel  . Comprehensive metabolic panel  . EKG 12-Lead      Leonie Man, M.D., M.S. Interventional Cardiologist   Pager # [phone] Phone # [phone] [address] [place], [place] [address]

## 2016-05-05 NOTE — Assessment & Plan Note (Signed)
Clearly multifactorial. He has ILD with pulmonary hypertension on home oxygen. He also has some component of diastolic dysfunction exacerbating his existing pulmonary hypertension. We talked about the potential of going back to pulmonary medicine, and he is not really excited about the idea. He doesn't think he can even try to do a 6 minute walk. He is not interested in any invasive or noninvasive studies at this point. He will simply continue to Silver on with his oxygen. I don't think any of his dyspnea is related to angina, but could have some into hypertensive and coronary heart disease related diastolic dysfunction mediated dyspnea.

## 2016-05-05 NOTE — Patient Instructions (Addendum)
Restart losartan- hctz (hyzaar) 160/12.5  -take 1/2 tablet  Daily   Please have labs drawn--- lipids, CMP  Your physician wants you to follow-up in [place].   You will receive a reminder letter in the mail two months in advance. If you don't receive a letter, please call our office to schedule the follow-up appointment.   If you need a refill on your cardiac medications before your next appointment, please call your pharmacy.

## 2016-05-05 NOTE — Assessment & Plan Note (Signed)
Blood pressure is up enough now I think we can probably start him back on ARB. We'll start him now at one half his original dose which was 160/12.5 mg of losartan/HCTZ.

## 2016-06-11 LAB — COMPREHENSIVE METABOLIC PANEL
ALT: 20 U/L (ref 9–46)
AST: 22 U/L (ref 10–35)
Albumin: 4.1 g/dL (ref 3.6–5.1)
Alkaline Phosphatase: 67 U/L (ref 40–115)
BUN: 22 mg/dL (ref 7–25)
CHLORIDE: 99 mmol/L (ref 98–110)
CO2: 27 mmol/L (ref 20–31)
CREATININE: 1.09 mg/dL (ref 0.70–1.18)
Calcium: 9.1 mg/dL (ref 8.6–10.3)
GLUCOSE: 88 mg/dL (ref 65–99)
POTASSIUM: 4.7 mmol/L (ref 3.5–5.3)
SODIUM: 139 mmol/L (ref 135–146)
TOTAL PROTEIN: 6.6 g/dL (ref 6.1–8.1)
Total Bilirubin: 0.6 mg/dL (ref 0.2–1.2)

## 2016-06-11 LAB — LIPID PANEL
CHOL/HDL RATIO: 3.8 ratio (ref <=5.0)
Cholesterol: 133 mg/dL (ref 125–200)
HDL: 35 mg/dL — AB (ref >=40)
LDL CALC: 76 mg/dL (ref <130)
TRIGLYCERIDES: 109 mg/dL (ref <150)
VLDL: 22 mg/dL (ref <30)

## 2016-06-24 ENCOUNTER — Ambulatory Visit: Payer: Medicare Other | Admitting: Pulmonary Disease

## 2016-06-24 ENCOUNTER — Ambulatory Visit: Payer: Medicare Other

## 2016-06-27 ENCOUNTER — Telehealth: Payer: Self-pay | Admitting: *Deleted

## 2016-06-27 NOTE — Telephone Encounter (Signed)
Left message to call back in regards lab results

## 2016-06-27 NOTE — Telephone Encounter (Signed)
-----  Message from Leonie Man, MD sent at 06/25/2016  2:35 PM EDT ----- Lab review looks pretty good. HDL is stable. Not quite where we would like to be, but better than it has been. LDL is essentially at goal at 76. Total cholesterol is great. Chemistry panel looks wonderful. Kidney function is notably improved from 4 months ago. Electrolytes look normal. Liver function normal.  Glenetta Hew, MD  Please for PCP: Imagene Riches, NP

## 2016-06-27 NOTE — Telephone Encounter (Signed)
Spoke to patient. Result given . Verbalized understanding ROUTED TO PRIMARY

## 2016-07-16 ENCOUNTER — Encounter: Payer: Self-pay | Admitting: Cardiology

## 2016-09-23 ENCOUNTER — Ambulatory Visit (INDEPENDENT_AMBULATORY_CARE_PROVIDER_SITE_OTHER): Payer: Medicare Other | Admitting: Neurology

## 2016-09-23 ENCOUNTER — Encounter: Payer: Self-pay | Admitting: Neurology

## 2016-09-23 VITALS — BP 161/76 | HR 59 | Ht 73.0 in | Wt 192.5 lb

## 2016-09-23 DIAGNOSIS — R269 Unspecified abnormalities of gait and mobility: Secondary | ICD-10-CM

## 2016-09-23 DIAGNOSIS — G629 Polyneuropathy, unspecified: Secondary | ICD-10-CM

## 2016-09-23 DIAGNOSIS — I25119 Atherosclerotic heart disease of native coronary artery with unspecified angina pectoris: Secondary | ICD-10-CM | POA: Diagnosis not present

## 2016-09-23 NOTE — Progress Notes (Signed)
Reason for visit: Peripheral neuropathy  Steven Cameron is an 81 y.o. male  History of present illness:  Steven Cameron is a 81 year old right-handed white male with a history of a significant peripheral neuropathy associated with bilateral foot drops. The patient has a gait disorder, he is now using a walker for ambulation. He is also limited with his walking with his breathing, if he walks for more than 5 or 6 minutes he becomes severely short of breath. He is on oxygen at all times at this point. The patient has not had any recent falls. He reports gradually increasing discomfort in the legs, he takes hydrocodone for this. In the past he has not wanted to go on any other medications for his leg pain. The patient sleeps in a recliner as this helps his breathing and his leg pain. He denies any numbness or pain in the hands. He has to climb a flight of stairs to get into his house, he is able to manage this issue at this point.  Past Medical History:  Diagnosis Date  . Anxiety disorder   . Arthritis   . Chronic diastolic heart failure secondary to coronary artery disease (HCC)    Elevated LVEDP noted on cardiac catheterization in setting of non-STEMI.  DD noted on echo January 2017  . Coronary artery disease involving autologous vein bypass graft 2017   Admitted for ACS/aborted inferior STEMI -- occluded SVG-RI-OM; PCI of proximal (Promus DES 3.5 x 32 -- 3.8 mm) and sequential limb from RI-OM (Promus DES 3.5 x 12--3.7 mm).    . Coronary artery disease involving native coronary artery with angina pectoris ([place]) 2004   a. s/p CABG in 2004 b. 12/2015 NSTEMI --> * 100% SVG-RI-OM (80% RI-OM limb). Ost LM 99%, ost-pLAD 100% &100% after grafted D2. Osti-RI  & ostCx100%. ost-pRCA 80%, dRCA 70%, Ostial rPDA 60%. SVG-dRCA, SVG-D2 and LIMA-LAD patent.   Mod-severe LV dysfunction w/ mod-severely elevated LVEDP.  . Depression   . Foot drop, bilateral 03/22/2013  . GERD (gastroesophageal reflux disease)     . Gout   . Hyperlipidemia   . Hypertension   . Hypertensive heart disease with congestive heart failure and stage 2 kidney disease ([place])   . Neuropathy (HCC)    In both legs, below knee  . Peripheral neuropathy (HCC)    Probable  . Prostate cancer [facility])    Prostate  . Pulmonary fibrosis, unspecified ([place]) 2002   With interstitial lung disease.  . Pulmonary hypertension due to interstitial lung disease (HCC)    PA pressures by echocardiogram estimated at 70-78 mmHg. Is on home oxygen.    Past Surgical History:  Procedure Laterality Date  . CARDIAC CATHETERIZATION  09/24/2004   EF 60%  . CARDIAC CATHETERIZATION N/A 01/27/2016   Procedure: Left Heart Cath and Cors/Grafts Angiography;  Surgeon: Leonie Man, MD;  Location: [place] [facility];  Service: CV; * 100% SVG-RI-OM (80% RI-OM limb). Ost LM 99%, ost-pLAD 100% &100% after grafted D2. Osti-RI  & ostCx100%. ost-pRCA 80%, dRCA 70%, Ostial rPDA 60%. SVG-dRCA, SVG-D2 and LIMA-LAD patent.   Mod-severe LV dysfunction w/ mod-severely elevated LVEDP.  [place] Kitchen CARDIAC CATHETERIZATION N/A 01/27/2016   Procedure: Coronary Stent Intervention;  Surgeon: Leonie Man, MD;  Location: [place] [facility];  Service: Cardiovascular;  occluded SVG-RI-OM; PCI of proximal (Promus DES 3.5 mm x 32 mm -- 3.8 mm) & seq limb from RI-OM (Promus DES 3.36m x 180m-3.7 mm)  . CARDIOVASCULAR STRESS TEST  07/17/2010   EF 61%  . CATARACT EXTRACTION, BILATERAL    . CORONARY ARTERY BYPASS GRAFT  2004  . HEMORRHOID SURGERY    . radium seed implant     Prostate cancer  . TONSILLECTOMY    . TRANSTHORACIC ECHOCARDIOGRAM  Nov 2016; Jan 2017   a. Normal LV size, function, EF 60-65%.no RWMA. Gr 1 DD, mild LA dilation, mod RV dilation - PAP ~78 mmHg;;b.EF 50-55%. GR 1 DD. Mild MR. Mild-moderate RV dilation. PAP ~70 mmHg.  [place] Kitchen US ECHOCARDIOGRAPHY  01/19/2008   EF 55-60%    Family History  Problem Relation Age of Onset  . Alzheimer's disease Mother 2  . Coronary artery  disease Father   . Prostate cancer Father     Social history:  reports that he quit smoking about 25 years ago. His smoking use included Cigarettes. He has a 75.00 pack-year smoking history. He has never used smokeless tobacco. He reports that he does not drink alcohol or use drugs.    Allergies  Allergen Reactions  . Lyrica [Pregabalin]     Gait instability  . Statins Other (See Comments)    Weakness of legs  . Ibuprofen Rash    Medications:  Prior to Admission medications   Medication Sig Start Date End Date Taking? Authorizing Provider  allopurinol (ZYLOPRIM) 100 MG tablet Take 1 tablet (100 mg total) by mouth 2 (two) times daily. 02/21/13  Yes Darlin Coco, MD  ALPRAZolam Duanne Moron) 0.5 MG tablet Take 0.025 mg by mouth 3 (three) times daily as needed for anxiety (anxiety).    Yes Historical Provider, MD  aspirin 81 MG EC tablet Take 81 mg by mouth daily.     Yes Historical Provider, MD  B Complex-C (SUPER B COMPLEX PO) Take 1 tablet by mouth daily.   Yes Historical Provider, MD  cholecalciferol (VITAMIN D) 1000 UNITS tablet Take 1,000 Units by mouth daily.   Yes Historical Provider, MD  fluocinonide cream (LIDEX) 0.05 %  04/28/16  Yes Historical Provider, MD  furosemide (LASIX) 20 MG tablet Take 1 tablet (20 mg total) by mouth daily. 02/06/16  Yes Eileen Stanford, PA-C  HYDROcodone-acetaminophen (NORCO/VICODIN) 5-325 MG per tablet Take 0.5 tablets by mouth every 6 (six) hours as needed for moderate pain (pain).  09/20/13  Yes Historical Provider, MD  losartan-hydrochlorothiazide (HYZAAR) 100-12.5 MG tablet Take 0.5 tablets by mouth daily. 05/05/16  Yes Leonie Man, MD  lovastatin (MEVACOR) 20 MG tablet Take 1 tablet (20 mg total) by mouth at bedtime. 01/29/16  Yes Eileen Stanford, PA-C  metoprolol tartrate (LOPRESSOR) 25 MG tablet Take 1 tablet (25 mg total) by mouth 2 (two) times daily. 01/29/16  Yes Eileen Stanford, PA-C  Misc Natural Products (OSTEO BI-FLEX JOINT SHIELD PO)  Take 1 tablet by mouth 2 (two) times daily.    Yes Historical Provider, MD  nitroGLYCERIN (NITROSTAT) 0.4 MG SL tablet Place 1 tablet (0.4 mg total) under the tongue every 5 (five) minutes as needed for chest pain (x 3 doses). For chest pain 05/05/16  Yes Leonie Man, MD  omega-3 acid ethyl esters (LOVAZA) 1 G capsule Take 1 g by mouth 3 (three) times daily.   Yes Historical Provider, MD  omeprazole (PRILOSEC) 20 MG capsule Take 20 mg by mouth 2 (two) times daily.   Yes Historical Provider, MD  potassium chloride SA (K-DUR,KLOR-CON) 20 MEQ tablet TAKE 1 TABLET BY MOUTH DAILY 11/06/15  Yes Darlin Coco, MD  Red Yeast Rice 600 MG  CAPS Take 600 mg by mouth 2 (two) times daily.   Yes Historical Provider, MD  ticagrelor (BRILINTA) 90 MG TABS tablet Take 1 tablet (90 mg total) by mouth 2 (two) times daily. 01/29/16  Yes Eileen Stanford, PA-C    ROS:  Out of a complete 14 system review of symptoms, the patient complains only of the following symptoms, and all other reviewed systems are negative.  Joint pain, aching muscles, walking difficulty, neck pain Bruising easily  Blood pressure (!) 161/76, pulse (!) 59, height 6' 1" (1.854 m), weight 192 lb 8 oz (87.3 kg).  Physical Exam  General: The patient is alert and cooperative at the time of the examination.  Skin: No significant peripheral edema is noted.   Neurologic Exam  Mental status: The patient is alert and oriented x 3 at the time of the examination. The patient has apparent normal recent and remote memory, with an apparently normal attention span and concentration ability.   Cranial nerves: Facial symmetry is present. Speech is normal, no aphasia or dysarthria is noted. Extraocular movements are full. Visual fields are full.  Motor: The patient has good strength in all 4 extremities, with exception of bilateral foot drops.  Sensory examination: Soft touch sensation is symmetric on the face, arms, and legs. The patient has a  significant reduction in sensation below the knees bilaterally.  Coordination: The patient has good finger-nose-finger and heel-to-shin bilaterally.  Gait and station: The patient has a wide-based, bilateral steppage gait pattern. The patient walks with a walker. Romberg is unsteady.  Reflexes: Deep tendon reflexes are symmetric, but are depressed.   Assessment/Plan:  1. Peripheral neuropathy  2. Gait disorder  3. Bilateral foot drops  The patient does have AFO braces, but he does not use them. The patient is now using a walker for ambulation. He is limited to some degree secondary to his peripheral neuropathy with walking, but also with his breathing issues. The patient does not wish to go on any other medications for discomfort. At this time, we will see the patient back on an as-needed basis. He will call if he requires any assistance.  Jill Alexanders MD 09/23/2016 11:31 AM  [facility] [address] [place], [place] [address]  Phone [phone] Fax [phone]

## 2016-09-23 NOTE — Patient Instructions (Addendum)
Fall Prevention in the Home  Falls can cause injuries and can affect people from all age groups. There are many simple things that you can do to make your home safe and to help prevent falls. WHAT CAN I DO ON THE OUTSIDE OF MY HOME?  Regularly repair the edges of walkways and driveways and fix any cracks.  Remove high doorway thresholds.  Trim any shrubbery on the main path into your home.  Use bright outdoor lighting.  Clear walkways of debris and clutter, including tools and rocks.  Regularly check that handrails are securely fastened and in good repair. Both sides of any steps should have handrails.  Install guardrails along the edges of any raised decks or porches.  Have leaves, snow, and ice cleared regularly.  Use sand or salt on walkways during winter months.  In the garage, clean up any spills right away, including grease or oil spills. WHAT CAN I DO IN THE BATHROOM?  Use night lights.  Install grab bars by the toilet and in the tub and shower. Do not use towel bars as grab bars.  Use non-skid mats or decals on the floor of the tub or shower.  If you need to sit down while you are in the shower, use a plastic, non-slip stool.[place]  Keep the floor dry. Immediately clean up any water that spills on the floor.  Remove soap buildup in the tub or shower on a regular basis.  Attach bath mats securely with double-sided non-slip rug tape.  Remove throw rugs and other tripping hazards from the floor. WHAT CAN I DO IN THE BEDROOM?  Use night lights.  Make sure that a bedside light is easy to reach.  Do not use oversized bedding that drapes onto the floor.  Have a firm chair that has side arms to use for getting dressed.  Remove throw rugs and other tripping hazards from the floor. WHAT CAN I DO IN THE KITCHEN?   Clean up any spills right away.  Avoid walking on wet floors.  Place frequently used items in easy-to-reach places.  If you need to reach for something  above you, use a sturdy step stool that has a grab bar.  Keep electrical cables out of the way.  Do not use floor polish or wax that makes floors slippery. If you have to use wax, make sure that it is non-skid floor wax.  Remove throw rugs and other tripping hazards from the floor. WHAT CAN I DO IN THE STAIRWAYS?  Do not leave any items on the stairs.  Make sure that there are handrails on both sides of the stairs. Fix handrails that are broken or loose. Make sure that handrails are as long as the stairways.  Check any carpeting to make sure that it is firmly attached to the stairs. Fix any carpet that is loose or worn.  Avoid having throw rugs at the top or bottom of stairways, or secure the rugs with carpet tape to prevent them from moving.  Make sure that you have a light switch at the top of the stairs and the bottom of the stairs. If you do not have them, have them installed. WHAT ARE SOME OTHER FALL PREVENTION TIPS?  Wear closed-toe shoes that fit well and support your feet. Wear shoes that have rubber soles or low heels.  When you use a stepladder, make sure that it is completely opened and that the sides are firmly locked. Have someone hold the ladder while you  are using it. Do not climb a closed stepladder.  Add color or contrast paint or tape to grab bars and handrails in your home. Place contrasting color strips on the first and last steps.  Use mobility aids as needed, such as canes, walkers, scooters, and crutches.  Turn on lights if it is dark. Replace any light bulbs that burn out.  Set up furniture so that there are clear paths. Keep the furniture in the same spot.  Fix any uneven floor surfaces.  Choose a carpet design that does not hide the edge of steps of a stairway.  Be aware of any and all pets.  Review your medicines with your healthcare provider. Some medicines can cause dizziness or changes in blood pressure, which increase your risk of falling. Talk  with your health care provider about other ways that you can decrease your risk of falls. This may include working with a physical therapist or trainer to improve your strength, balance, and endurance.   This information is not intended to replace advice given to you by your health care provider. Make sure you discuss any questions you have with your health care provider.   Document Released: 12/05/2002 Document Revised: 05/01/2015 Document Reviewed: 01/19/2015 Elsevier Interactive Patient Education [employer].

## 2016-10-20 ENCOUNTER — Other Ambulatory Visit: Payer: Self-pay

## 2016-10-20 MED ORDER — POTASSIUM CHLORIDE CRYS ER 20 MEQ PO TBCR: 20.0000 meq | EXTENDED_RELEASE_TABLET | Freq: Every day | ORAL | 10 refills | Status: DC

## 2016-11-25 ENCOUNTER — Telehealth: Payer: Self-pay | Admitting: Cardiology

## 2016-11-25 MED ORDER — TICAGRELOR 90 MG PO TABS: 90.0000 mg | ORAL_TABLET | Freq: Two times a day (BID) | ORAL | 3 refills | Status: DC

## 2016-11-25 NOTE — Telephone Encounter (Signed)
Refill sent to the pharmacy electronically.  

## 2016-11-25 NOTE — Telephone Encounter (Signed)
New Message   *STAT* If patient is at the pharmacy, call can be transferred to refill team.   1. Which medications need to be refilled? (please list name of each medication and dose if known) Brilinta 50m   2. Which pharmacy/location (including street and city if local pharmacy) is medication to be sent to? [facility]  3. Do they need a 30 day or 90 day supply? [address]

## 2016-12-01 ENCOUNTER — Encounter: Payer: Self-pay | Admitting: Cardiology

## 2016-12-01 ENCOUNTER — Ambulatory Visit (INDEPENDENT_AMBULATORY_CARE_PROVIDER_SITE_OTHER): Payer: Medicare Other | Admitting: Cardiology

## 2016-12-01 VITALS — BP 150/84 | HR 58 | Ht 73.0 in | Wt 198.4 lb

## 2016-12-01 DIAGNOSIS — I25810 Atherosclerosis of coronary artery bypass graft(s) without angina pectoris: Secondary | ICD-10-CM

## 2016-12-01 DIAGNOSIS — I25700 Atherosclerosis of coronary artery bypass graft(s), unspecified, with unstable angina pectoris: Secondary | ICD-10-CM | POA: Diagnosis not present

## 2016-12-01 DIAGNOSIS — I119 Hypertensive heart disease without heart failure: Secondary | ICD-10-CM

## 2016-12-01 DIAGNOSIS — I10 Essential (primary) hypertension: Secondary | ICD-10-CM

## 2016-12-01 DIAGNOSIS — E785 Hyperlipidemia, unspecified: Secondary | ICD-10-CM

## 2016-12-01 DIAGNOSIS — I25119 Atherosclerotic heart disease of native coronary artery with unspecified angina pectoris: Secondary | ICD-10-CM | POA: Diagnosis not present

## 2016-12-01 DIAGNOSIS — I272 Pulmonary hypertension, unspecified: Secondary | ICD-10-CM

## 2016-12-01 DIAGNOSIS — I200 Unstable angina: Secondary | ICD-10-CM

## 2016-12-01 DIAGNOSIS — Z951 Presence of aortocoronary bypass graft: Secondary | ICD-10-CM

## 2016-12-01 NOTE — Progress Notes (Signed)
PCP: Steven Riches, NP  Clinic Note: Chief Complaint  Patient presents with  . Follow-up    6 month  . Coronary Artery Disease    Test was Within then PCI to SVG-RI-OM  . Shortness of Breath    Pulmonary hypertension with pulmonary fibrosis    HPI: Steven Cameron is a 81 y.o. male with a PMH below who presents today for Six-month follow-up for CAD-CABG with inferior STEMI in January 2017 resulting in PCI to [place]. He also has chronic diastolic heart failure.   CABG in 2004(SVG-RI-CxOM, SVG-D2, SVG-dRCA, LIMA-LAD),  Steven Cameron was last seen in May 2017  Recent Hospitalizations: none  Studies Reviewed: none since last vist  Interval History: Steven Cameron presents today for routine follow-up with his baseline dyspnea that his wife describes as "getting worse ". He is having the wall of the walker now and notes that it's definitely harder to walk. He says that is probably more related to back to the has arthritis and neuropathy bothering him that keeps him from being on walk.  He does have pulmonary fibrosis with pulmonary hypertension.  He is not had any chest tightness or pressure rest or exertion. No real PND or orthopnea , but does have some mild edema that is chronic but nothing worse than usual. He denies any rapid irregular heartbeat/palpitations. No lightheadedness, dizziness, syncope/near syncopal. TIA/emergency symptoms.  No melena, hematochezia, hematuria, or epstaxis. No claudication.  I talked about possibly going back to see his pulmonologist, but he was frustrated about the concept of having to a 6 minute walk without his walker distal testicle short of breath he was. He also feels that his lungs are treating any better and is not sure that this can be done, so he simply is happy walking around his oxygen. He cracks jokes to light and sediment of it but seems to be relatively resigned to his fate of having significant, incurable and very difficult to treat lung  disease.  ROS: A comprehensive was performed. Review of Systems  Constitutional: Positive for malaise/fatigue (Less energy than usual). Negative for chills and fever.  HENT: Negative for congestion and nosebleeds.   Eyes: Negative.   Respiratory: Positive for cough, shortness of breath and wheezing.   Cardiovascular:       Per history of present illness  Gastrointestinal: Negative for abdominal pain, constipation, heartburn and nausea.  Genitourinary: Negative.   Musculoskeletal: Positive for back pain, joint pain and myalgias (Right calf more than left).  Skin: Negative.   Neurological: Positive for tingling (Neuropathy of lower extremities and feet ). Negative for focal weakness. Weakness: Generalized weakness.  Endo/Heme/Allergies: Does not bruise/bleed easily.  Psychiatric/Behavioral: Negative for depression and memory loss. The patient is not nervous/anxious and does not have insomnia.   All other systems reviewed and are negative.   Past Medical History:  Diagnosis Date  . Anxiety disorder   . Arthritis   . Chronic diastolic heart failure secondary to coronary artery disease (HCC)    Elevated LVEDP noted on cardiac catheterization in setting of non-STEMI.  DD noted on echo January 2017  . Coronary artery disease involving autologous vein bypass graft 2017   Admitted for ACS/aborted inferior STEMI -- occluded SVG-RI-OM; PCI of proximal (Promus DES 3.5 x 32 -- 3.8 mm) and sequential limb from RI-OM (Promus DES 3.5 x 12--3.7 mm).    . Coronary artery disease involving native coronary artery with angina pectoris ([place]) 2004   a. s/p CABG in 2004 b.  12/2015 NSTEMI --> * 100% SVG-RI-OM (80% RI-OM limb). Ost LM 99%, ost-pLAD 100% &100% after grafted D2. Osti-RI  & ostCx100%. ost-pRCA 80%, dRCA 70%, Ostial rPDA 60%. SVG-dRCA, SVG-D2 and LIMA-LAD patent.   Mod-severe LV dysfunction w/ mod-severely elevated LVEDP.  . Depression   . Foot drop, bilateral 03/22/2013  . GERD (gastroesophageal  reflux disease)   . Gout   . Hyperlipidemia   . Hypertension   . Hypertensive heart disease with congestive heart failure and stage 2 kidney disease ([place])   . Neuropathy (HCC)    In both legs, below knee  . Peripheral neuropathy (HCC)    Probable  . Prostate cancer [facility])    Prostate  . Pulmonary fibrosis, unspecified ([place]) 2002   With interstitial lung disease.  . Pulmonary hypertension due to interstitial lung disease (HCC)    PA pressures by echocardiogram estimated at 70-78 mmHg. Is on home oxygen.    Past Surgical History:  Procedure Laterality Date  . CARDIAC CATHETERIZATION  09/24/2004   EF 60%  . CARDIAC CATHETERIZATION N/A 01/27/2016   Procedure: Left Heart Cath and Cors/Grafts Angiography;  Surgeon: Leonie Man, MD;  Location: [facility];  Service: CV; * 100% SVG-RI-OM (80% RI-OM limb). Ost LM 99%, ost-pLAD 100% &100% after grafted D2. Osti-RI  & ostCx100%. ost-pRCA 80%, dRCA 70%, Ostial rPDA 60%. SVG-dRCA, SVG-D2 and LIMA-LAD patent.   Mod-severe LV dysfunction w/ mod-severely elevated LVEDP.  Marland Kitchen CARDIAC CATHETERIZATION N/A 01/27/2016   Procedure: Coronary Stent Intervention;  Surgeon: Leonie Man, MD;  Location: [facility];  Service: Cardiovascular;  occluded SVG-RI-OM; PCI of proximal (Promus DES 3.5 mm x 32 mm -- 3.8 mm) & seq limb from RI-OM (Promus DES 3.4m x 122m-3.7 mm)  . CARDIOVASCULAR STRESS TEST  07/17/2010   EF 61%  . CATARACT EXTRACTION, BILATERAL    . CORONARY ARTERY BYPASS GRAFT  2004  . HEMORRHOID SURGERY    . radium seed implant     Prostate cancer  . TONSILLECTOMY    . TRANSTHORACIC ECHOCARDIOGRAM  Nov 2016; Jan 2017   a. Normal LV size, function, EF 60-65%.no RWMA. Gr 1 DD, mild LA dilation, mod RV dilation - PAP ~78 mmHg;;b.EF 50-55%. GR 1 DD. Mild MR. Mild-moderate RV dilation. PAP ~70 mmHg.  . Marland KitchenSKoreaCHOCARDIOGRAPHY  01/19/2008   EF 55-60%   Cath-PCI Jan 2017  SVG-RI-OM: Mid Graft lesion, before Ramus, 100% stenosed. PCI with  Promus DES stent (3.5 mm x 32 mm, 3.8 mm). Post intervention, there is a 0% residual stenosis.              Current Meds  Medication Sig  . allopurinol (ZYLOPRIM) 100 MG tablet Take 1 tablet (100 mg total) by mouth 2 (two) times daily.  . Marland KitchenLPRAZolam (XANAX) 0.5 MG tablet Take 0.025 mg by mouth 3 (three) times daily as needed for anxiety (anxiety).   . Marland Kitchenspirin 81 MG EC tablet Take 81 mg by mouth daily.    . B Complex-C (SUPER B COMPLEX PO) Take 1 tablet by mouth daily.  . cholecalciferol (VITAMIN D) 1000 UNITS tablet Take 1,000 Units by mouth daily.  . fluocinonide cream (LIDEX) 0.6.43 Apply 1 application topically daily as needed (itching).   . furosemide (LASIX) 20 MG tablet Take 1 tablet (20 mg total) by mouth daily.  . Marland KitchenYDROcodone-acetaminophen (NORCO/VICODIN) 5-325 MG per tablet Take 0.5 tablets by mouth every 6 (six) hours as needed for moderate pain (pain).   . Marland Kitchenosartan-hydrochlorothiazide (HYZAAR)  100-12.5 MG tablet Take 0.5 tablets by mouth daily.  Marland Kitchen lovastatin (MEVACOR) 20 MG tablet Take 1 tablet (20 mg total) by mouth at bedtime.  . metoprolol tartrate (LOPRESSOR) 25 MG tablet Take 1 tablet (25 mg total) by mouth 2 (two) times daily.  . Misc Natural Products (OSTEO BI-FLEX JOINT SHIELD PO) Take 1 tablet by mouth 2 (two) times daily.   . nitroGLYCERIN (NITROSTAT) 0.4 MG SL tablet Place 1 tablet (0.4 mg total) under the tongue every 5 (five) minutes as needed for chest pain (x 3 doses). For chest pain  . omega-3 acid ethyl esters (LOVAZA) 1 G capsule Take 1 g by mouth 3 (three) times daily.  Marland Kitchen omeprazole (PRILOSEC) 20 MG capsule Take 20 mg by mouth 2 (two) times daily.  . potassium chloride SA (K-DUR,KLOR-CON) 20 MEQ tablet Take 1 tablet (20 mEq total) by mouth daily.  . Red Yeast Rice 600 MG CAPS Take 600 mg by mouth 2 (two) times daily.  . ticagrelor (BRILINTA) 90 MG TABS tablet Take 1 tablet (90 mg total) by mouth 2 (two) times daily.   Allergies  Allergen Reactions  . Lyrica  [Pregabalin]     Gait instability  . Statins Other (See Comments)    Weakness of legs  . Ibuprofen Rash     Social History   Social History  . Marital status: Married    Spouse name: N/A  . Number of children: 2  . Years of education: 14   Occupational History  . Retired     retired   Social History Main Topics  . Smoking status: Former Smoker    Packs/day: 3.00    Years: 25.00    Types: Cigarettes    Quit date: 08/01/1991  . Smokeless tobacco: Never Used  . Alcohol use No  . Drug use: No  . Sexual activity: Not Asked   Other Topics Concern  . None   Social History Narrative   Patient lives at home with his wife Steven Cameron).   Retired.    Education some college.Right handed.   Caffeine coffee four cups .       Family History  Problem Relation Age of Onset  . Alzheimer's disease Mother 93  . Coronary artery disease Father   . Prostate cancer Father      Wt Readings from Last 3 Encounters:  12/01/16 90 kg (198 lb 6.4 oz)  09/23/16 87.3 kg (192 lb 8 oz)  05/05/16 88 kg (194 lb)    PHYSICAL EXAM BP (!) 150/84   Pulse (!) 58   Ht _0  (1.854 m)   Wt 90 kg (198 lb 6.4 oz)   BMI 26.18 kg/m  --> says at home and elsewhere his blood pressures been roughly 130/60 mmHg General appearance: alert, cooperative, appears stated age, no distress andWell-nourished, well-groomed. He is wearing home oxygen. Neck: no adenopathy, no carotid bruit and no JVD Lungs:non-labored, clear to percussion and diffuse interstitial lung sounds with fine diffuse crackles. Heart: regular rate and rhythm, S1 & S2 normal, no murmur, click, rub or gallop Abdomen: soft, non-tender; bowel sounds normal; no masses,  no organomegaly; Extremities: extremities normal, atraumatic, no cyanosis, and Trace edema Pulses: 2+ and symmetric;  Skin: mobility and turgor normal or  Neurologic: Mental status: Alert, oriented, thought content appropriate Cranial nerves: normal (II-XII grossly intact)    Adult ECG Report  Rate: 58 ;  Rhythm: sinus bradycardia and Otherwise normal axis, intervals and durations.;   Narrative Interpretation: Stable EKG  Other studies Reviewed: Additional studies/ records that were reviewed today include:  Recent Labs:   Lab Results  Component Value Date   CREATININE 1.09 06/10/2016   BUN 22 06/10/2016   NA 139 06/10/2016   K 4.7 06/10/2016   CL 99 06/10/2016   CO2 27 06/10/2016   Lab Results  Component Value Date   CHOL 133 06/10/2016   HDL 35 (L) 06/10/2016   LDLCALC 76 06/10/2016   TRIG 109 06/10/2016   CHOLHDL 3.8 06/10/2016    ASSESSMENT / PLAN: Problem List Items Addressed This Visit    CAD (coronary artery disease) of artery bypass graft (Chronic)    He had lesions in both limbs of the sequential vein graft to the RI-OM. Both treated with stents. Excellent results and improved EF. No further angina. Remains on dual antiplatelet therapy. No bleeding issues. He indicated that he probably would not be interested in follow-up screening stress test in the future.      Relevant Orders   EKG 12-Lead (Completed)   Coronary artery disease involving native heart with angina pectoris ([place]) - Primary (Chronic)    He has known diffuse coronary disease is graft dependent now. Has had PCI to a large graft and now the rest the grafts are patent or stented. No active angina symptoms since his PCI. Remains on Brilinta plus aspirin for his multiple stents. He is on statin plus Lovaza, beta blocker and ARB.      Dyslipidemia, goal LDL below 70 (Chronic)    Well-controlled lipids in June current dose of statin and Lovaza.      Relevant Orders   EKG 12-Lead (Completed)   Hypertensive heart disease (Chronic)    No active heart failure symptoms, but he does have diastolic dysfunction with elevated LVEDP which makes his pulmonary hypertension both combined primary and secondary.  Occasional intermittent edema. On stable dose of Lasix and is not taking  additional. I instructed him that okay for him to take an additional dose if he gains weight or becomes more short of breath with dyspnea or orthopnea.      Relevant Orders   EKG 12-Lead (Completed)   Moderate to severe pulmonary hypertension (Chronic)    Mostly primary from lung disease, but also has some diastolic dysfunction component of secondary pulmonary hypertension. I recommended that he talk to pulmonary medicine about treatment options. I think at the reduction with increase ARB would be helpful, diuresis is helpful. However with his fixed pulmonary disease, I'm not sure that he would be any benefit from directed pulmonary hypertension therapy. Could consider calcium channel blocker as another option.      Essential hypertension (Chronic)    Actually higher blood pressure today than it had been before. We had cut back on his losartan-HCTZ dose to half a pill. He says his blood pressures are better than they are here as compared to when he is at home.  He is due to see his PCP soon. I would just recommend that his blood pressure is still over the 1:30/80 mmHg range, we should consider increasing his losartan-HCTZ dose up to full tablet of 100 mg-25 mg.      Hx of CABG    Other Visit Diagnoses    Coronary artery disease involving coronary bypass graft of native heart with unstable angina pectoris (HCC)  (Chronic)         Current medicines are reviewed at length with the patient today. (+/- concerns) n/a The following changes have been  made: See below  Patient Instructions  NO CHANGES AT CURRENT TIME   IF BLOOD PRESSURE IS STILL ELEVATED AT NEXT APPOINTMENT WITH PRIMARY DOCTOR - MAY SHE MAY INCREASE LOSARTAN DOSE.    Your physician wants you to follow-up in [place]. You will receive a reminder letter in the mail two months in advance. If you don't receive a letter, please call our office to schedule the follow-up appointment.   If you need a refill on  your cardiac medications before your next appointment, please call your pharmacy.    Studies Ordered:   Orders Placed This Encounter  Procedures  . EKG 12-Lead      Glenetta Hew, M.D., M.S. Interventional Cardiologist   Pager # [phone] Phone # [phone] [address] [place], [place] [address]

## 2016-12-01 NOTE — Patient Instructions (Signed)
NO CHANGES AT CURRENT TIME   IF BLOOD PRESSURE IS STILL ELEVATED AT NEXT APPOINTMENT WITH PRIMARY DOCTOR - MAY SHE MAY INCREASE LOSARTAN DOSE.    Your physician wants you to follow-up in [place]. You will receive a reminder letter in the mail two months in advance. If you don't receive a letter, please call our office to schedule the follow-up appointment.   If you need a refill on your cardiac medications before your next appointment, please call your pharmacy.

## 2016-12-02 ENCOUNTER — Encounter: Payer: Self-pay | Admitting: Cardiology

## 2016-12-02 NOTE — Assessment & Plan Note (Signed)
Mostly primary from lung disease, but also has some diastolic dysfunction component of secondary pulmonary hypertension. I recommended that he talk to pulmonary medicine about treatment options. I think at the reduction with increase ARB would be helpful, diuresis is helpful. However with his fixed pulmonary disease, I'm not sure that he would be any benefit from directed pulmonary hypertension therapy. Could consider calcium channel blocker as another option.

## 2016-12-02 NOTE — Assessment & Plan Note (Signed)
No active heart failure symptoms, but he does have diastolic dysfunction with elevated LVEDP which makes his pulmonary hypertension both combined primary and secondary.  Occasional intermittent edema. On stable dose of Lasix and is not taking additional. I instructed him that okay for him to take an additional dose if he gains weight or becomes more short of breath with dyspnea or orthopnea.

## 2016-12-02 NOTE — Assessment & Plan Note (Signed)
He had lesions in both limbs of the sequential vein graft to the RI-OM. Both treated with stents. Excellent results and improved EF. No further angina. Remains on dual antiplatelet therapy. No bleeding issues. He indicated that he probably would not be interested in follow-up screening stress test in the future.

## 2016-12-02 NOTE — Assessment & Plan Note (Signed)
Actually higher blood pressure today than it had been before. We had cut back on his losartan-HCTZ dose to half a pill. He says his blood pressures are better than they are here as compared to when he is at home.  He is due to see his PCP soon. I would just recommend that his blood pressure is still over the 1:30/80 mmHg range, we should consider increasing his losartan-HCTZ dose up to full tablet of 100 mg-25 mg.

## 2016-12-02 NOTE — Assessment & Plan Note (Signed)
He has known diffuse coronary disease is graft dependent now. Has had PCI to a large graft and now the rest the grafts are patent or stented. No active angina symptoms since his PCI. Remains on Brilinta plus aspirin for his multiple stents. He is on statin plus Lovaza, beta blocker and ARB.

## 2016-12-02 NOTE — Assessment & Plan Note (Signed)
Well-controlled lipids in June current dose of statin and Lovaza.

## 2016-12-08 ENCOUNTER — Ambulatory Visit (INDEPENDENT_AMBULATORY_CARE_PROVIDER_SITE_OTHER): Payer: Medicare Other | Admitting: Pulmonary Disease

## 2016-12-08 ENCOUNTER — Encounter: Payer: Self-pay | Admitting: Pulmonary Disease

## 2016-12-08 VITALS — BP 160/92 | HR 67

## 2016-12-08 DIAGNOSIS — I25700 Atherosclerosis of coronary artery bypass graft(s), unspecified, with unstable angina pectoris: Secondary | ICD-10-CM

## 2016-12-08 DIAGNOSIS — J849 Interstitial pulmonary disease, unspecified: Secondary | ICD-10-CM | POA: Diagnosis not present

## 2016-12-08 NOTE — Patient Instructions (Addendum)
We will schedule for a high-resolution CT and pulmonary function tests. Return to clinic after tests for review and plan for further measures.

## 2016-12-08 NOTE — Progress Notes (Signed)
Steven Cameron    [id number]    1935-06-01  Primary Care Physician:YORK,REGINA F, NP  Referring Physician: Imagene Riches, NP [address] [place], [place] [address]  Chief complaint:   Follow up for  ILD Pulmonary hypertension  HPI: Steven Cameron is a 81 year old with history of hypertension, coronary artery disease, gout. Admitted to [facility] on 11/08/15 with dyspnea, hypoxemia, and nSTEMI. Found to have pulmonary fibrosis on CT and severe pulmonary hypertension.  He has history of gout and was on colchicine in the past. This was stopped secondary to neuropathy. He is currently on allopurinol. He also has history of osteoarthritis, diastolic congestive heart failure, coronary artery disease. He worked in various jobs initially in a Copy. 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 He may have had significant exposure to asbestos during this time. 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 He lives with his wife. He has a 75-pack-year history of smoking. Quit around 1995.  This is significant family history of pulmonary fibrosis on his mother's side. His mother, aunt and cousins all suffered from pulmonary fibrosis. He was told that his mother developed pulmonary fibrosis from medication for arthritis. He does not recall what this medication is. His family was enrolled in a gene study for pulmonary fibrosis at [facility] about 5 years ago. He was told that his family had a susceptibility gene for primary fibrosis. He does not know if he carries a gene as well and he does not know what this gene is. There is history of prostate cancer on his father's side.   He denies any other exposures except for asbestos. He denies any joint pain except for gout and osteoarthritis. He denies any signs and symptoms of connective tissue disease or autoimmune disease. I had recommended a workup but he initially refused and was  discharged. However he returns now for follow-up accompanied by his wife. He was evaluated for oxygen and discharged on 2 L O2 24/7.  Outpatient Encounter Prescriptions as of 12/08/2016  Medication Sig  . allopurinol (ZYLOPRIM) 100 MG tablet Take 1 tablet (100 mg total) by mouth 2 (two) times daily.  Marland Kitchen ALPRAZolam (XANAX) 0.5 MG tablet Take 0.025 mg by mouth 3 (three) times daily as needed for anxiety (anxiety).   Marland Kitchen aspirin 81 MG EC tablet Take 81 mg by mouth daily.    . B Complex-C (SUPER B COMPLEX PO) Take 1 tablet by mouth daily.  . cholecalciferol (VITAMIN D) 1000 UNITS tablet Take 1,000 Units by mouth daily.  . fluocinonide cream (LIDEX) 5.91 % Apply 1 application topically daily as needed (itching).   . furosemide (LASIX) 20 MG tablet Take 1 tablet (20 mg total) by mouth daily.  . Glucosamine-Chondroitin 250-200 MG TABS Take 250 mg by mouth.  Marland Kitchen HYDROcodone-acetaminophen (NORCO/VICODIN) 5-325 MG per tablet Take 0.5 tablets by mouth every 6 (six) hours as needed for moderate pain (pain).   Marland Kitchen losartan-hydrochlorothiazide (HYZAAR) 100-12.5 MG tablet Take 0.5 tablets by mouth daily.  Marland Kitchen lovastatin (MEVACOR) 20 MG tablet Take 1 tablet (20 mg total) by mouth at bedtime.  . metoprolol tartrate (LOPRESSOR) 25 MG tablet Take 1 tablet (25 mg total) by mouth 2 (two) times daily.  . Misc Natural Products (OSTEO BI-FLEX JOINT SHIELD PO) Take 1 tablet by mouth 2 (two) times daily.   . nitroGLYCERIN (NITROSTAT) 0.4 MG SL tablet Place  1 tablet (0.4 mg total) under the tongue every 5 (five) minutes as needed for chest pain (x 3 doses). For chest pain  . omega-3 acid ethyl esters (LOVAZA) 1 G capsule Take 1 g by mouth 3 (three) times daily.  Marland Kitchen omeprazole (PRILOSEC) 20 MG capsule Take 20 mg by mouth 2 (two) times daily.  . potassium chloride SA (K-DUR,KLOR-CON) 20 MEQ tablet Take 1 tablet (20 mEq total) by mouth daily.  . Red Yeast Rice 600 MG CAPS Take 600 mg by mouth 2 (two) times daily.  . ticagrelor  (BRILINTA) 90 MG TABS tablet Take 1 tablet (90 mg total) by mouth 2 (two) times daily.   No facility-administered encounter medications on file as of 12/08/2016.     Allergies as of 12/08/2016 - Review Complete 12/08/2016  Allergen Reaction Noted  . Lyrica [pregabalin]  09/22/2013  . Statins Other (See Comments)   . Ibuprofen Rash 08/01/2011    Past Medical History:  Diagnosis Date  . Anxiety disorder   . Arthritis   . Chronic diastolic heart failure secondary to coronary artery disease (HCC)    Elevated LVEDP noted on cardiac catheterization in setting of non-STEMI.  DD noted on echo January 2017  . Coronary artery disease involving autologous vein bypass graft 2017   Admitted for ACS/aborted inferior STEMI -- occluded SVG-RI-OM; PCI of proximal (Promus DES 3.5 x 32 -- 3.8 mm) and sequential limb from RI-OM (Promus DES 3.5 x 12--3.7 mm).    . Coronary artery disease involving native coronary artery with angina pectoris ([place]) 2004   a. s/p CABG in 2004 b. 12/2015 NSTEMI --> * 100% SVG-RI-OM (80% RI-OM limb). Ost LM 99%, ost-pLAD 100% &100% after grafted D2. Osti-RI  & ostCx100%. ost-pRCA 80%, dRCA 70%, Ostial rPDA 60%. SVG-dRCA, SVG-D2 and LIMA-LAD patent.   Mod-severe LV dysfunction w/ mod-severely elevated LVEDP.  . Depression   . Foot drop, bilateral 03/22/2013  . GERD (gastroesophageal reflux disease)   . Gout   . Hyperlipidemia   . Hypertension   . Hypertensive heart disease with congestive heart failure and stage 2 kidney disease ([place])   . Neuropathy (HCC)    In both legs, below knee  . Peripheral neuropathy (HCC)    Probable  . Prostate cancer [facility])    Prostate  . Pulmonary fibrosis, unspecified (Between) 2002   With interstitial lung disease.  . Pulmonary hypertension due to interstitial lung disease (HCC)    PA pressures by echocardiogram estimated at 70-78 mmHg. Is on home oxygen.    Past Surgical History:  Procedure Laterality Date  . CARDIAC CATHETERIZATION  09/24/2004    EF 60%  . CARDIAC CATHETERIZATION N/A 01/27/2016   Procedure: Left Heart Cath and Cors/Grafts Angiography;  Surgeon: Leonie Man, MD;  Location: [place] [facility];  Service: CV; * 100% SVG-RI-OM (80% RI-OM limb). Ost LM 99%, ost-pLAD 100% &100% after grafted D2. Osti-RI  & ostCx100%. ost-pRCA 80%, dRCA 70%, Ostial rPDA 60%. SVG-dRCA, SVG-D2 and LIMA-LAD patent.   Mod-severe LV dysfunction w/ mod-severely elevated LVEDP.  Marland Kitchen CARDIAC CATHETERIZATION N/A 01/27/2016   Procedure: Coronary Stent Intervention;  Surgeon: Leonie Man, MD;  Location: [place] [facility];  Service: Cardiovascular;  occluded SVG-RI-OM; PCI of proximal (Promus DES 3.5 mm x 32 mm -- 3.8 mm) & seq limb from RI-OM (Promus DES 3.44m x 16m-3.7 mm)  . CARDIOVASCULAR STRESS TEST  07/17/2010   EF 61%  . CATARACT EXTRACTION, BILATERAL    . CORONARY ARTERY BYPASS  GRAFT  2004  . HEMORRHOID SURGERY    . radium seed implant     Prostate cancer  . TONSILLECTOMY    . TRANSTHORACIC ECHOCARDIOGRAM  Nov 2016; Jan 2017   a. Normal LV size, function, EF 60-65%.no RWMA. Gr 1 DD, mild LA dilation, mod RV dilation - PAP ~78 mmHg;;b.EF 50-55%. GR 1 DD. Mild MR. Mild-moderate RV dilation. PAP ~70 mmHg.  Marland Kitchen US ECHOCARDIOGRAPHY  01/19/2008   EF 55-60%    Family History  Problem Relation Age of Onset  . Alzheimer's disease Mother 52  . Coronary artery disease Father   . Prostate cancer Father     Social History   Social History  . Marital status: Married    Spouse name: N/A  . Number of children: 2  . Years of education: 14   Occupational History  . Retired     retired   Social History Main Topics  . Smoking status: Former Smoker    Packs/day: 3.00    Years: 25.00    Types: Cigarettes    Quit date: 08/01/1991  . Smokeless tobacco: Never Used  . Alcohol use No  . Drug use: No  . Sexual activity: Not on file   Other Topics Concern  . Not on file   Social History Narrative   Patient lives at home with his wife  Mikle Bosworth).   Retired.    Education some college.Right handed.   Caffeine coffee four cups .         Review of systems: Review of Systems  Constitutional: Negative for fever and chills.  HENT: Negative.   Eyes: Negative for blurred vision.  Respiratory: as per HPI  Cardiovascular: Negative for chest pain and palpitations.  Gastrointestinal: Negative for vomiting, diarrhea, blood per rectum. Genitourinary: Negative for dysuria, urgency, frequency and hematuria.  Musculoskeletal: Negative for myalgias, back pain and joint pain.  Skin: Negative for itching and rash.  Neurological: Negative for dizziness, tremors, focal weakness, seizures and loss of consciousness.  Endo/Heme/Allergies: Negative for environmental allergies.  Psychiatric/Behavioral: Negative for depression, suicidal ideas and hallucinations.  All other systems reviewed and are negative.   Physical Exam: Blood pressure (!) 160/92, pulse 67, SpO2 96 %. Gen:      No acute distress HEENT:  EOMI, sclera anicteric Neck:     No masses; no thyromegaly Lungs:    Clear to auscultation bilaterally; normal respiratory effort CV:         Regular rate and rhythm; no murmurs Abd:      + bowel sounds; soft, non-tender; no palpable masses, no distension Ext:    No edema; adequate peripheral perfusion Skin:      Warm and dry; no rash Neuro: alert and oriented x 3 Psych: normal mood and affect  Data Reviewed: CT scan 11/08/15 No PEs, Basal fibrosis, honeycombing, reticulation. Images reviewed  Echo (11/09/15) - Left ventricle: The cavity size was normal. Wall thickness was normal. Systolic function was normal. The estimated ejection fraction was in the range of 60% to 65%. Wall motion was normal; there were no regional wall motion abnormalities. Doppler parameters are consistent with abnormal left ventricular relaxation (grade 1 diastolic dysfunction). - Left atrium: The atrium was mildly dilated. - Right  ventricle: The cavity size was moderately dilated. Systolic function was moderately reduced. - Right atrium: The atrium was moderately dilated. - Pulmonary arteries: Systolic pressure was severely increased. PA peak pressure: 78 mm Hg (S).  Serologies 12/18/15 Negative for ANA, Jo1, CCP, RF, SSA,  SSB, Scl 70  Assessment:  Pulmonary fibrosis, pulmonary hypertension.  Mr. Wassink appears to have a familial form of pulmonary fibrosis. This is strong history of pulmonary fibrosis on his mother's side. His family was in fact enrolled in a study from [place] and was told that the family carries a gene for the fibrosis. He himself was not tested and does not know if he carries a gene as well. On review of his CT scan he has findings of basal fibrosis, honeycombing, reticulation that suggestive of IPF. He has exposure to asbestos as well although it's not clear what the severity of this exposure is. This could be another reason for pulmonary fibrosis.Marland Kitchen He does not have any signs and symptoms of rheumatic or autoimmune disease to explain the fibrosis and serologies are negative. There is no suggestion of hypersensitivity pneumonitis. His pulmonary hypertension may be related to his ILD. There is no evidence of PEs on the CTA.  I'm not sure to what extent his asbestos exposure is contributing to the fibrosis. I'll get a high-resolution CT for better characterization. 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 He could be a candidate for one of the newer therapies such as Pirfenidone or Nintedanib or treatment for pulmonary hypertension. In the past she has been reluctant to get any kind of workup or treatment. However he appears to have been persuaded by his wife of 53 yrs to return to clinic today. He has agreed to get High res CT and PFTs. He is not interested in the 6 minute walk test.  Plan/Recommendations: - High res CT, PFTs  Marshell Garfinkel MD [facility] Pager [phone] 12/08/2016,  12:32 PM  CC: Imagene Riches, NP

## 2016-12-09 ENCOUNTER — Ambulatory Visit (INDEPENDENT_AMBULATORY_CARE_PROVIDER_SITE_OTHER)
Admission: RE | Admit: 2016-12-09 | Discharge: 2016-12-09 | Disposition: A | Discharge status: Home / Self Care | Payer: Medicare Other | Source: Ambulatory Visit | Attending: Pulmonary Disease | Admitting: Pulmonary Disease

## 2016-12-09 DIAGNOSIS — J849 Interstitial pulmonary disease, unspecified: Secondary | ICD-10-CM | POA: Diagnosis not present

## 2016-12-15 ENCOUNTER — Ambulatory Visit (INDEPENDENT_AMBULATORY_CARE_PROVIDER_SITE_OTHER): Payer: Medicare Other | Admitting: Pulmonary Disease

## 2016-12-15 DIAGNOSIS — J849 Interstitial pulmonary disease, unspecified: Secondary | ICD-10-CM | POA: Diagnosis not present

## 2016-12-15 LAB — PULMONARY FUNCTION TEST
DL/VA % pred: 40 %
DL/VA: 1.89 ml/min/mmHg/L
DLCO COR % PRED: 28 %
DLCO COR: 10.12 ml/min/mmHg
DLCO UNC % PRED: 28 %
DLCO unc: 9.85 ml/min/mmHg
FEF 25-75 POST: 2.27 L/s
FEF 25-75 Pre: 1.96 L/sec
FEF2575-%Change-Post: 15 %
FEF2575-%PRED-PRE: 88 %
FEF2575-%Pred-Post: 102 %
FEV1-%Change-Post: 3 %
FEV1-%PRED-POST: 91 %
FEV1-%PRED-PRE: 88 %
FEV1-POST: 2.9 L
FEV1-Pre: 2.79 L
FEV1FVC-%CHANGE-POST: 0 %
FEV1FVC-%PRED-PRE: 101 %
FEV6-%Change-Post: 3 %
FEV6-%Pred-Post: 93 %
FEV6-%Pred-Pre: 90 %
FEV6-POST: 3.85 L
FEV6-Pre: 3.74 L
FEV6FVC-%Change-Post: 0 %
FEV6FVC-%PRED-POST: 104 %
FEV6FVC-%Pred-Pre: 104 %
FVC-%Change-Post: 3 %
FVC-%PRED-POST: 89 %
FVC-%PRED-PRE: 86 %
FVC-POST: 3.93 L
FVC-PRE: 3.81 L
PRE FEV1/FVC RATIO: 73 %
Post FEV1/FVC ratio: 74 %
Post FEV6/FVC ratio: 98 %
Pre FEV6/FVC Ratio: 98 %
RV % pred: 56 %
RV: 1.54 L
TLC % PRED: 82 %
TLC: 6.16 L

## 2016-12-25 ENCOUNTER — Telehealth: Payer: Self-pay | Admitting: Pulmonary Disease

## 2016-12-25 NOTE — Telephone Encounter (Signed)
Called and spoke with pt and he is aware of results per PM.  He will discuss with PM at his visit on 1-18.

## 2016-12-25 NOTE — Telephone Encounter (Signed)
Please let the patient know that I have reviewed his PFTs and CT scan. The findings confirm that he has lung fibrosis and he may be a candidate for antifibrotic therapies. If he is interested in getting on the the therapy I will discuss further with him at next clinic visit.

## 2017-01-15 ENCOUNTER — Ambulatory Visit: Payer: Medicare Other | Admitting: Pulmonary Disease

## 2017-01-20 ENCOUNTER — Other Ambulatory Visit: Payer: Self-pay

## 2017-01-20 ENCOUNTER — Telehealth: Payer: Self-pay | Admitting: Cardiology

## 2017-01-20 MED ORDER — METOPROLOL TARTRATE 25 MG PO TABS: 25.0000 mg | ORAL_TABLET | Freq: Two times a day (BID) | ORAL | 11 refills | Status: DC

## 2017-01-20 NOTE — Telephone Encounter (Signed)
New Message        *STAT* If patient is at the pharmacy, call can be transferred to refill team.   1. Which medications need to be refilled? (please list name of each medication and dose if known)  metoprolol tartrate (LOPRESSOR) 25 MG tablet Take 1 tablet (25 mg total) by mouth 2 (two) times daily.     2. Which pharmacy/location (including street and city if local pharmacy) is medication to be sent to?[facility]   3. Do they need a 30 day or 90 day supply? [place]

## 2017-01-29 ENCOUNTER — Encounter: Payer: Self-pay | Admitting: Pulmonary Disease

## 2017-01-29 ENCOUNTER — Encounter (INDEPENDENT_AMBULATORY_CARE_PROVIDER_SITE_OTHER): Payer: Self-pay

## 2017-01-29 ENCOUNTER — Ambulatory Visit (INDEPENDENT_AMBULATORY_CARE_PROVIDER_SITE_OTHER): Payer: Medicare Other | Admitting: Pulmonary Disease

## 2017-01-29 VITALS — BP 132/60 | HR 55 | Ht 73.0 in | Wt 200.0 lb

## 2017-01-29 DIAGNOSIS — I272 Pulmonary hypertension, unspecified: Secondary | ICD-10-CM | POA: Diagnosis not present

## 2017-01-29 DIAGNOSIS — J84112 Idiopathic pulmonary fibrosis: Secondary | ICD-10-CM | POA: Diagnosis not present

## 2017-01-29 NOTE — Patient Instructions (Signed)
I have discussed your CT scan, pulmonary function test and lab results reviewed today. You likely had a condition called idiopathic pulmonary fibrosis We have discussed possible treatments including esbriet and ofev  We will continue further discussions about the condition and possible treatments.

## 2017-01-29 NOTE — Progress Notes (Signed)
BEATRIZ SETTLES    [id number]    [birth year]  Primary Care Physician:YORK,REGINA F, NP  Referring Physician: Imagene Riches, NP [place], [place] [address]  Chief complaint:   Follow up for  ILD, Probable UIP Pulmonary hypertension Asbestos exposure  HPI: Mr. Ribaudo is a 82 year old with history of hypertension, coronary artery disease, gout. Admitted to [facility] on 11/08/15 with dyspnea, hypoxemia, and nSTEMI. Found to have pulmonary fibrosis on CT and severe pulmonary hypertension.  He has history of gout and was on colchicine in the past. This was stopped secondary to neuropathy. He is currently on allopurinol. He also has history of osteoarthritis, diastolic congestive heart failure, coronary artery disease. He worked in various jobs initially in a Copy. 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 He may have had significant exposure to asbestos during this time. 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 He lives with his wife. He has a 75-pack-year history of smoking. Quit around 1995.  This is significant family history of pulmonary fibrosis on his mother's side. His mother, aunt and cousins all suffered from pulmonary fibrosis. He was told that his mother developed pulmonary fibrosis from medication for arthritis. He does not recall what this medication is. His family was enrolled in a gene study for pulmonary fibrosis at [facility] about 5 years ago. He was told that his family had a susceptibility gene for primary fibrosis. He does not know if he carries a gene as well and he does not know what this gene is. There is history of prostate cancer on his father's side.   He denies any other exposures except for asbestos. He denies any joint pain except for gout and osteoarthritis. He denies any signs and symptoms of connective tissue disease or autoimmune disease. I had recommended a workup  but he initially refused and was discharged. However he returns now for follow-up accompanied by his wife. He was evaluated for oxygen and discharged on 2 L O2 24/7.  Interim history: He continues to have slowly progressive dyspnea on exertion. He denies any cough, fevers, chills, wheezing. He's had a workup including blood tests and CT scan and is here for discussion and plan for treatment.  Outpatient Encounter Prescriptions as of 01/29/2017  Medication Sig  . allopurinol (ZYLOPRIM) 100 MG tablet Take 1 tablet (100 mg total) by mouth 2 (two) times daily.  Marland Kitchen ALPRAZolam (XANAX) 0.5 MG tablet Take 0.025 mg by mouth 3 (three) times daily as needed for anxiety (anxiety).   Marland Kitchen aspirin 81 MG EC tablet Take 81 mg by mouth daily.    . B Complex-C (SUPER B COMPLEX PO) Take 1 tablet by mouth daily.  . cholecalciferol (VITAMIN D) 1000 UNITS tablet Take 1,000 Units by mouth daily.  . fluocinonide cream (LIDEX) 2.35 % Apply 1 application topically daily as needed (itching).   . furosemide (LASIX) 20 MG tablet Take 1 tablet (20 mg total) by mouth daily.  . Glucosamine-Chondroitin 250-200 MG TABS Take 250 mg by mouth.  Marland Kitchen HYDROcodone-acetaminophen (NORCO/VICODIN) 5-325 MG per tablet Take 0.5 tablets by mouth every 6 (six) hours as needed for moderate pain (pain).   Marland Kitchen losartan-hydrochlorothiazide (HYZAAR) 100-12.5 MG tablet Take 0.5 tablets by mouth daily.  Marland Kitchen lovastatin (MEVACOR) 20 MG tablet Take 1 tablet (20 mg total) by mouth at bedtime.  . metoprolol tartrate (LOPRESSOR) 25 MG  tablet Take 1 tablet (25 mg total) by mouth 2 (two) times daily.  . Misc Natural Products (OSTEO BI-FLEX JOINT SHIELD PO) Take 1 tablet by mouth 2 (two) times daily.   . nitroGLYCERIN (NITROSTAT) 0.4 MG SL tablet Place 1 tablet (0.4 mg total) under the tongue every 5 (five) minutes as needed for chest pain (x 3 doses). For chest pain  . omega-3 acid ethyl esters (LOVAZA) 1 G capsule Take 1 g by mouth 3 (three) times daily.  Marland Kitchen omeprazole  (PRILOSEC) 20 MG capsule Take 20 mg by mouth 2 (two) times daily.  . Red Yeast Rice 600 MG CAPS Take 600 mg by mouth 2 (two) times daily.  . ticagrelor (BRILINTA) 90 MG TABS tablet Take 1 tablet (90 mg total) by mouth 2 (two) times daily.  . [DISCONTINUED] potassium chloride SA (K-DUR,KLOR-CON) 20 MEQ tablet Take 1 tablet (20 mEq total) by mouth daily. (Patient not taking: Reported on 01/29/2017)   No facility-administered encounter medications on file as of 01/29/2017.     Allergies as of 01/29/2017 - Review Complete 01/29/2017  Allergen Reaction Noted  . Lyrica [pregabalin]  09/22/2013  . Statins Other (See Comments)   . Ibuprofen Rash 08/01/2011    Past Medical History:  Diagnosis Date  . Anxiety disorder   . Arthritis   . Chronic diastolic heart failure secondary to coronary artery disease (HCC)    Elevated LVEDP noted on cardiac catheterization in setting of non-STEMI.  DD noted on echo January 2017  . Coronary artery disease involving autologous vein bypass graft 2017   Admitted for ACS/aborted inferior STEMI -- occluded SVG-RI-OM; PCI of proximal (Promus DES 3.5 x 32 -- 3.8 mm) and sequential limb from RI-OM (Promus DES 3.5 x 12--3.7 mm).    . Coronary artery disease involving native coronary artery with angina pectoris ([place]) 2004   a. s/p CABG in 2004 b. 12/2015 NSTEMI --> * 100% SVG-RI-OM (80% RI-OM limb). Ost LM 99%, ost-pLAD 100% &100% after grafted D2. Osti-RI  & ostCx100%. ost-pRCA 80%, dRCA 70%, Ostial rPDA 60%. SVG-dRCA, SVG-D2 and LIMA-LAD patent.   Mod-severe LV dysfunction w/ mod-severely elevated LVEDP.  . Depression   . Foot drop, bilateral 03/22/2013  . GERD (gastroesophageal reflux disease)   . Gout   . Hyperlipidemia   . Hypertension   . Hypertensive heart disease with congestive heart failure and stage 2 kidney disease ([place])   . Neuropathy (HCC)    In both legs, below knee  . Peripheral neuropathy (HCC)    Probable  . Prostate cancer [facility])    Prostate  .  Pulmonary fibrosis, unspecified ([place]) 2002   With interstitial lung disease.  . Pulmonary hypertension due to interstitial lung disease (HCC)    PA pressures by echocardiogram estimated at 70-78 mmHg. Is on home oxygen.    Past Surgical History:  Procedure Laterality Date  . CARDIAC CATHETERIZATION  09/24/2004   EF 60%  . CARDIAC CATHETERIZATION N/A 01/27/2016   Procedure: Left Heart Cath and Cors/Grafts Angiography;  Surgeon: Leonie Man, MD;  Location: [facility];  Service: CV; * 100% SVG-RI-OM (80% RI-OM limb). Ost LM 99%, ost-pLAD 100% &100% after grafted D2. Osti-RI  & ostCx100%. ost-pRCA 80%, dRCA 70%, Ostial rPDA 60%. SVG-dRCA, SVG-D2 and LIMA-LAD patent.   Mod-severe LV dysfunction w/ mod-severely elevated LVEDP.  Marland Kitchen CARDIAC CATHETERIZATION N/A 01/27/2016   Procedure: Coronary Stent Intervention;  Surgeon: Leonie Man, MD;  Location: [place] [facility];  Service: Cardiovascular;  occluded SVG-RI-OM; PCI of proximal (Promus DES 3.5 mm x 32 mm -- 3.8 mm) & seq limb from RI-OM (Promus DES 3.17m x 128m-3.7 mm)  . CARDIOVASCULAR STRESS TEST  07/17/2010   EF 61%  . CATARACT EXTRACTION, BILATERAL    . CORONARY ARTERY BYPASS GRAFT  2004  . HEMORRHOID SURGERY    . radium seed implant     Prostate cancer  . TONSILLECTOMY    . TRANSTHORACIC ECHOCARDIOGRAM  Nov 2016; Jan 2017   a. Normal LV size, function, EF 60-65%.no RWMA. Gr 1 DD, mild LA dilation, mod RV dilation - PAP ~78 mmHg;;b.EF 50-55%. GR 1 DD. Mild MR. Mild-moderate RV dilation. PAP ~70 mmHg.  . Marland KitchenSKoreaCHOCARDIOGRAPHY  01/19/2008   EF 55-60%    Family History  Problem Relation Age of Onset  . Alzheimer's disease Mother 914. Coronary artery disease Father   . Prostate cancer Father     Social History   Social History  . Marital status: Married    Spouse name: N/A  . Number of children: 2  . Years of education: 14   Occupational History  . Retired     retired   Social History Main Topics  . Smoking  status: Former Smoker    Packs/day: 3.00    Years: 25.00    Types: Cigarettes    Quit date: 08/01/1991  . Smokeless tobacco: Never Used  . Alcohol use No  . Drug use: No  . Sexual activity: Not on file   Other Topics Concern  . Not on file   Social History Narrative   Patient lives at home with his wife (GMikle Bosworth   Retired.    Education some college.Right handed.   Caffeine coffee four cups .       Review of systems: Review of Systems  Constitutional: Negative for fever and chills.  HENT: Negative.   Eyes: Negative for blurred vision.  Respiratory: as per HPI  Cardiovascular: Negative for chest pain and palpitations.  Gastrointestinal: Negative for vomiting, diarrhea, blood per rectum. Genitourinary: Negative for dysuria, urgency, frequency and hematuria.  Musculoskeletal: Negative for myalgias, back pain and joint pain.  Skin: Negative for itching and rash.  Neurological: Negative for dizziness, tremors, focal weakness, seizures and loss of consciousness.  Endo/Heme/Allergies: Negative for environmental allergies.  Psychiatric/Behavioral: Negative for depression, suicidal ideas and hallucinations.  All other systems reviewed and are negative.   Physical Exam: Blood pressure 132/60, pulse (!) 55, height _0  (1.854 m), weight 90.7 kg (200 lb), SpO2 95 %. Gen:      No acute distress HEENT:  EOMI, sclera anicteric Neck:     No masses; no thyromegaly Lungs:   Mild basal crackles; normal respiratory effort CV:         Regular rate and rhythm; no murmurs Abd:      + bowel sounds; soft, non-tender; no palpable masses, no distension Ext:    No edema; adequate peripheral perfusion Skin:      Warm and dry; no rash Neuro: alert and oriented x 3 Psych: normal mood and affect Data Reviewed: Imaging CT scan 11/08/15 No PEs, Basal fibrosis reticulation. Images reviewed  CT scan had resolution 12/09/16-subpleural reticulation, peripheral bronchiectasis, traction  bronchiectasis, early honeycombing. Probable UIP pattern. Images reviewed.  Echo (11/09/15) - Left ventricle: The cavity size was normal. Wall thickness was normal. Systolic function was normal. The estimated ejection fraction was in the range of 60% to 65%. Wall motion was normal; there were no  regional wall motion abnormalities. Doppler parameters are consistent with abnormal left ventricular relaxation (grade 1 diastolic dysfunction). - Left atrium: The atrium was mildly dilated. - Right ventricle: The cavity size was moderately dilated. Systolic function was moderately reduced. - Right atrium: The atrium was moderately dilated. - Pulmonary arteries: Systolic pressure was severely increased. PA peak pressure: 78 mm Hg (S).  Serologies 12/18/15 Negative for ANA, Jo1, CCP, RF, SSA, SSB, Scl 70  PFTs 12/15/16 FVC 3.93 [89%) FEV1 2.90 (91%) F/F 74 TLC 82% DLCO 28%  Minimal obstruction with severe diffusion defect.  Assessment:  Pulmonary fibrosis, Likely IPF Mr. Micheletti appears to have a familial form of pulmonary fibrosis. There is strong history of pulmonary fibrosis on his mother's side. His family was in fact enrolled in a study from [place] and was told that the family carries a gene for the fibrosis. He himself was not tested and does not know if he carries a gene as well. I have reviewed his latest pulmonary function tests and high-resolution CT. There is progression of fibrosis in a pattern that's probable UIP. This is likely IPF. Although there is exposure to asbestos, the progression over time and lack of pleural plaques makes it less likely. He does not have any signs and symptoms of rheumatic, autoimmune disease and serologies are negative. There are no exposures to suggest hypersensitivity.  I had a long discussion and suggested starting antifibrotic therapy such as esbriet or ofev. He is still reluctant to start with treatment although his wife would like him  to consider it. I have given them material on IPF and therapies. They will review this and discuss at home before making any decision.   Pulmonary hypertension.  His pulmonary hypertension is likely related to his ILD. There is no evidence of PEs on the CTA. I'll hold off on starting any pulmonary vasodilators as it may make the V/Q mismatch worse.  Plan/Recommendations: - Continue discussions about IPF and treatment  Marshell Garfinkel MD [facility] Pager [phone] 01/29/2017, 12:39 PM  CC: Imagene Riches, NP

## 2017-02-02 DIAGNOSIS — J84112 Idiopathic pulmonary fibrosis: Secondary | ICD-10-CM | POA: Insufficient documentation

## 2017-02-24 ENCOUNTER — Other Ambulatory Visit (HOSPITAL_COMMUNITY): Payer: Self-pay | Admitting: Family

## 2017-02-24 DIAGNOSIS — I6523 Occlusion and stenosis of bilateral carotid arteries: Secondary | ICD-10-CM

## 2017-03-10 ENCOUNTER — Other Ambulatory Visit: Payer: Self-pay | Admitting: *Deleted

## 2017-03-10 DIAGNOSIS — I213 ST elevation (STEMI) myocardial infarction of unspecified site: Secondary | ICD-10-CM

## 2017-03-10 DIAGNOSIS — R0609 Other forms of dyspnea: Secondary | ICD-10-CM

## 2017-03-10 DIAGNOSIS — R0902 Hypoxemia: Secondary | ICD-10-CM

## 2017-03-10 DIAGNOSIS — Z951 Presence of aortocoronary bypass graft: Secondary | ICD-10-CM

## 2017-03-10 DIAGNOSIS — E785 Hyperlipidemia, unspecified: Secondary | ICD-10-CM

## 2017-03-10 MED ORDER — FUROSEMIDE 20 MG PO TABS: 20.0000 mg | ORAL_TABLET | Freq: Every day | ORAL | 2 refills | Status: DC

## 2017-03-11 ENCOUNTER — Ambulatory Visit (HOSPITAL_COMMUNITY)
Admission: RE | Admit: 2017-03-11 | Discharge: 2017-03-11 | Disposition: A | Discharge status: Home / Self Care | Payer: Medicare Other | Source: Ambulatory Visit | Attending: Cardiovascular Disease | Admitting: Cardiovascular Disease

## 2017-03-11 DIAGNOSIS — J449 Chronic obstructive pulmonary disease, unspecified: Secondary | ICD-10-CM | POA: Diagnosis not present

## 2017-03-11 DIAGNOSIS — Z87891 Personal history of nicotine dependence: Secondary | ICD-10-CM | POA: Diagnosis not present

## 2017-03-11 DIAGNOSIS — E785 Hyperlipidemia, unspecified: Secondary | ICD-10-CM | POA: Insufficient documentation

## 2017-03-11 DIAGNOSIS — Z951 Presence of aortocoronary bypass graft: Secondary | ICD-10-CM | POA: Insufficient documentation

## 2017-03-11 DIAGNOSIS — I2510 Atherosclerotic heart disease of native coronary artery without angina pectoris: Secondary | ICD-10-CM | POA: Insufficient documentation

## 2017-03-11 DIAGNOSIS — I10 Essential (primary) hypertension: Secondary | ICD-10-CM | POA: Insufficient documentation

## 2017-03-11 DIAGNOSIS — I6523 Occlusion and stenosis of bilateral carotid arteries: Secondary | ICD-10-CM

## 2017-04-23 ENCOUNTER — Ambulatory Visit: Payer: Medicare Other | Admitting: Pulmonary Disease

## 2017-04-24 ENCOUNTER — Ambulatory Visit (INDEPENDENT_AMBULATORY_CARE_PROVIDER_SITE_OTHER): Payer: Medicare Other | Admitting: Adult Health

## 2017-04-24 ENCOUNTER — Encounter: Payer: Self-pay | Admitting: Adult Health

## 2017-04-24 VITALS — BP 128/80 | HR 57 | Ht 73.0 in | Wt 204.0 lb

## 2017-04-24 DIAGNOSIS — I6523 Occlusion and stenosis of bilateral carotid arteries: Secondary | ICD-10-CM

## 2017-04-24 DIAGNOSIS — J849 Interstitial pulmonary disease, unspecified: Secondary | ICD-10-CM | POA: Diagnosis not present

## 2017-04-24 DIAGNOSIS — J9610 Chronic respiratory failure, unspecified whether with hypoxia or hypercapnia: Secondary | ICD-10-CM | POA: Insufficient documentation

## 2017-04-24 DIAGNOSIS — Z23 Encounter for immunization: Secondary | ICD-10-CM

## 2017-04-24 DIAGNOSIS — J9611 Chronic respiratory failure with hypoxia: Secondary | ICD-10-CM | POA: Diagnosis not present

## 2017-04-24 DIAGNOSIS — I272 Pulmonary hypertension, unspecified: Secondary | ICD-10-CM | POA: Diagnosis not present

## 2017-04-24 NOTE — Addendum Note (Signed)
Addended by: Parke Poisson E on: 04/24/2017 05:33 PM   Modules accepted: Orders

## 2017-04-24 NOTE — Assessment & Plan Note (Signed)
Cont on O2 to keep sat >90%  desats with walking on o2 at 2l/m , needs 4l/m to keep sat >90%.   Plan  Patient Instructions  Prevnar vaccine today .  Increase Oxygen 4l/m with walking and 2l/m at rest .  Continue on current regimen  follow up Dr. Vaughan Browner in 4-6 months and As needed

## 2017-04-24 NOTE — Patient Instructions (Addendum)
Prevnar vaccine today .  Increase Oxygen 4l/m with walking and 2l/m at rest .  Continue on current regimen  follow up Dr. Vaughan Browner in 4-6 months and As needed

## 2017-04-24 NOTE — Progress Notes (Signed)
_0  ID: Steven Cameron, male    DOB: 1935/04/29, 82 y.o.   MRN: [id number]  Chief Complaint  Patient presents with  . Follow-up    ILD /PUlmonary HTN     Referring provider: Imagene Riches, NP  HPI: 82 year old male former smoker followed for pulmonary fibrosis, probable UIP-familial form,  and pulmonary HTN, and O2 RF  Asbestos exposure   TEST  CT scan 11/08/15 No PEs, Basal fibrosis reticulation. Images reviewed  CT scan had resolution 12/09/16-subpleural reticulation, peripheral bronchiectasis, traction bronchiectasis, early honeycombing. Probable UIP pattern. Images reviewed.  Echo (11/09/15) - Left ventricle: The cavity size was normal. Wall thickness was normal. Systolic function was normal. The estimated ejection fraction was in the range of 60% to 65%. Wall motion was normal; there were no regional wall motion abnormalities. Doppler parameters are consistent with abnormal left ventricular relaxation (grade 1 diastolic dysfunction). - Left atrium: The atrium was mildly dilated. - Right ventricle: The cavity size was moderately dilated. Systolic function was moderately reduced. - Right atrium: The atrium was moderately dilated. - Pulmonary arteries: Systolic pressure was severely increased. PA peak pressure: 78 mm Hg (S).  Serologies 12/18/15 Negative for ANA, Jo1, CCP, RF, SSA, SSB, Scl 70  PFTs 12/15/16 FVC 3.93 [89%) FEV1 2.90 (91%) F/F 74 TLC 82% DLCO 28%  Minimal obstruction with severe diffusion defect.   04/24/2017 Follow up: Pulmonary Fibrosis , Pulmonary HTN  Pt returns for 2 month follow up for IPF -probable UIP . He says he is doing about the same with his chronic dyspnea. /DOE. Last ov , discussed starting OFEV/Esbriet however pt declines anitfibrotics. We discussed his dz and possible treatment options today . He is very adamant that he does not want to start meds for his pulmonary fibrosis .  Currently he does not get  sob at rest with O2 on.  Mows the grass on riding mower each week, as long as he is sitting her does ok .  Walking is where he wears totally out.  Incline , long distance and carrying heavy objects he gives totally out.  Has  arthritis and neuropathy in legs that limit him. On chronic vicodin.  Uses a rolling walker . And place O2 tank on walker.  Has very minimal cough .  O2 sats do drop walking on O2 at 2l/m . Walk test showed he needed 4l/m walking to keep sat >905.       Allergies  Allergen Reactions  . Lyrica [Pregabalin]     Gait instability  . Statins Other (See Comments)    Weakness of legs  . Ibuprofen Rash    Immunization History  Administered Date(s) Administered  . Influenza Split 09/29/2015  . Influenza, High Dose Seasonal PF 08/29/2016  . Pneumococcal Polysaccharide-23 09/29/2015    Past Medical History:  Diagnosis Date  . Anxiety disorder   . Arthritis   . Chronic diastolic heart failure secondary to coronary artery disease (HCC)    Elevated LVEDP noted on cardiac catheterization in setting of non-STEMI.  DD noted on echo January 2017  . Coronary artery disease involving autologous vein bypass graft 2017   Admitted for ACS/aborted inferior STEMI -- occluded SVG-RI-OM; PCI of proximal (Promus DES 3.5 x 32 -- 3.8 mm) and sequential limb from RI-OM (Promus DES 3.5 x 12--3.7 mm).    . Coronary artery disease involving native coronary artery with angina pectoris ([place]) 2004   a. s/p CABG in 2004 b. 12/2015 NSTEMI --> * 100% SVG-RI-OM (  80% RI-OM limb). Ost LM 99%, ost-pLAD 100% &100% after grafted D2. Osti-RI  & ostCx100%. ost-pRCA 80%, dRCA 70%, Ostial rPDA 60%. SVG-dRCA, SVG-D2 and LIMA-LAD patent.   Mod-severe LV dysfunction w/ mod-severely elevated LVEDP.  . Depression   . Foot drop, bilateral 03/22/2013  . GERD (gastroesophageal reflux disease)   . Gout   . Hyperlipidemia   . Hypertension   . Hypertensive heart disease with congestive heart failure and stage 2  kidney disease ([place])   . Neuropathy    In both legs, below knee  . Peripheral neuropathy    Probable  . Prostate cancer [facility])    Prostate  . Pulmonary fibrosis, unspecified ([place]) 2002   With interstitial lung disease.  . Pulmonary hypertension due to interstitial lung disease (HCC)    PA pressures by echocardiogram estimated at 70-78 mmHg. Is on home oxygen.    Tobacco History: History  Smoking Status  . Former Smoker  . Packs/day: 3.00  . Years: 25.00  . Types: Cigarettes  . Quit date: 08/01/1991  Smokeless Tobacco  . Never Used   Counseling given: Not Answered   Outpatient Encounter Prescriptions as of 04/24/2017  Medication Sig  . allopurinol (ZYLOPRIM) 100 MG tablet Take 1 tablet (100 mg total) by mouth 2 (two) times daily.  Marland Kitchen ALPRAZolam (XANAX) 0.5 MG tablet Take 0.025 mg by mouth 3 (three) times daily as needed for anxiety (anxiety).   Marland Kitchen aspirin 81 MG EC tablet Take 81 mg by mouth daily.    . B Complex-C (SUPER B COMPLEX PO) Take 1 tablet by mouth daily.  . cholecalciferol (VITAMIN D) 1000 UNITS tablet Take 1,000 Units by mouth daily.  . fluocinonide cream (LIDEX) 1.69 % Apply 1 application topically daily as needed (itching).   . furosemide (LASIX) 20 MG tablet Take 1 tablet (20 mg total) by mouth daily.  . Glucosamine-Chondroitin 250-200 MG TABS Take 250 mg by mouth.  Marland Kitchen HYDROcodone-acetaminophen (NORCO/VICODIN) 5-325 MG per tablet Take 0.5 tablets by mouth every 6 (six) hours as needed for moderate pain (pain).   Marland Kitchen losartan-hydrochlorothiazide (HYZAAR) 100-12.5 MG tablet Take 0.5 tablets by mouth daily.  Marland Kitchen lovastatin (MEVACOR) 20 MG tablet Take 1 tablet (20 mg total) by mouth at bedtime.  . metoprolol tartrate (LOPRESSOR) 25 MG tablet Take 1 tablet (25 mg total) by mouth 2 (two) times daily.  . Misc Natural Products (OSTEO BI-FLEX JOINT SHIELD PO) Take 1 tablet by mouth 2 (two) times daily.   . nitroGLYCERIN (NITROSTAT) 0.4 MG SL tablet Place 1 tablet (0.4 mg total)  under the tongue every 5 (five) minutes as needed for chest pain (x 3 doses). For chest pain  . omega-3 acid ethyl esters (LOVAZA) 1 G capsule Take 1 g by mouth 3 (three) times daily.  Marland Kitchen omeprazole (PRILOSEC) 20 MG capsule Take 20 mg by mouth 2 (two) times daily.  . Red Yeast Rice 600 MG CAPS Take 600 mg by mouth 2 (two) times daily.  . ticagrelor (BRILINTA) 90 MG TABS tablet Take 1 tablet (90 mg total) by mouth 2 (two) times daily.   No facility-administered encounter medications on file as of 04/24/2017.      Review of Systems  Constitutional:   No  weight loss, night sweats,  Fevers, chills,  +fatigue, or  lassitude.  HEENT:   No headaches,  Difficulty swallowing,  Tooth/dental problems, or  Sore throat,                No sneezing, itching,  ear ache, nasal congestion, post nasal drip,   CV:  No chest pain,  Orthopnea, PND, swelling in lower extremities, anasarca, dizziness, palpitations, syncope.   GI  No heartburn, indigestion, abdominal pain, nausea, vomiting, diarrhea, change in bowel habits, loss of appetite, bloody stools.   Resp:    No chest wall deformity  Skin: no rash or lesions.  GU: no dysuria, change in color of urine, no urgency or frequency.  No flank pain, no hematuria   MS:  No joint pain or swelling.  No decreased range of motion.  No back pain.    Physical Exam  BP 128/80 (BP Location: Left Arm, Cuff Size: Normal)   Pulse (!) 57   Ht 6' 1" (1.854 m)   Wt 204 lb (92.5 kg)   SpO2 93%   BMI 26.91 kg/m   GEN: A/Ox3; pleasant , NAD, elderly on o2 , chronically ill appearing    HEENT:  [place]/AT,  EACs-clear, TMs-wnl, NOSE-clear, THROAT-clear, no lesions, no postnasal drip or exudate noted.   NECK:  Supple w/ fair ROM; no JVD; normal carotid impulses w/o bruits; no thyromegaly or nodules palpated; no lymphadenopathy.    RESP  BB crackles , . no accessory muscle use, no dullness to percussion  CARD:  RRR, no m/r/g, tr peripheral edema, pulses intact, no  cyanosis or clubbing.  GI:   Soft & nt; nml bowel sounds; no organomegaly or masses detected.   Musco: Warm bil, no deformities or joint swelling noted.   Neuro: alert, no focal deficits noted.    Skin: Warm, no lesions or rashes  .  Lab Results:  CBC    Component Value Date/Time   WBC 6.1 01/29/2016 0314   RBC 3.88 (L) 01/29/2016 0314   HGB 11.6 (L) 01/29/2016 0314   HCT 34.7 (L) 01/29/2016 0314   PLT 173 01/29/2016 0314   MCV 89.4 01/29/2016 0314   MCH 29.9 01/29/2016 0314   MCHC 33.4 01/29/2016 0314   RDW 13.4 01/29/2016 0314   LYMPHSABS 1.3 11/08/2015 1714   MONOABS 0.3 11/08/2015 1714   EOSABS 0.1 11/08/2015 1714   BASOSABS 0.0 11/08/2015 1714    BMET    Component Value Date/Time   NA 139 06/10/2016 0922   K 4.7 06/10/2016 0922   CL 99 06/10/2016 0922   CO2 27 06/10/2016 0922   GLUCOSE 88 06/10/2016 0922   BUN 22 06/10/2016 0922   CREATININE 1.09 06/10/2016 0922   CALCIUM 9.1 06/10/2016 0922   GFRNONAA >60 01/29/2016 0314   GFRAA >60 01/29/2016 0314    BNP    Component Value Date/Time   BNP 125.4 (H) 11/08/2015 1714    ProBNP No results found for: PROBNP  Imaging: No results found.   Assessment & Plan:   ILD (interstitial lung disease) ([place]) IPF -probable UIP Declines Esbriet /OFEV Unable to do pulmonary rehab due to arthritic pain.  Pt education on IPF  prevnar vaccine   Plan  Patient Instructions  Prevnar vaccine today .  Increase Oxygen 4l/m with walking and 2l/m at rest .  Continue on current regimen  follow up Dr. Vaughan Browner in 4-6 months and As needed       Moderate to severe pulmonary hypertension Cont on O2 to keep sats >90%  Plan  Patient Instructions  Prevnar vaccine today .  Increase Oxygen 4l/m with walking and 2l/m at rest .  Continue on current regimen  follow up Dr. Vaughan Browner in 4-6 months and As needed  Chronic respiratory failure (HCC) Cont on O2 to keep sat >90%  desats with walking on o2 at 2l/m ,  needs 4l/m to keep sat >90%.   Plan  Patient Instructions  Prevnar vaccine today .  Increase Oxygen 4l/m with walking and 2l/m at rest .  Continue on current regimen  follow up Dr. Vaughan Browner in 4-6 months and As needed          Rexene Edison, NP 04/24/2017

## 2017-04-24 NOTE — Assessment & Plan Note (Signed)
IPF -probable UIP Declines Esbriet /OFEV Unable to do pulmonary rehab due to arthritic pain.  Pt education on IPF  prevnar vaccine   Plan  Patient Instructions  Prevnar vaccine today .  Increase Oxygen 4l/m with walking and 2l/m at rest .  Continue on current regimen  follow up Dr. Vaughan Browner in 4-6 months and As needed

## 2017-04-24 NOTE — Assessment & Plan Note (Signed)
Cont on O2 to keep sats >90%  Plan  Patient Instructions  Prevnar vaccine today .  Increase Oxygen 4l/m with walking and 2l/m at rest .  Continue on current regimen  follow up Dr. Vaughan Browner in 4-6 months and As needed

## 2017-04-27 ENCOUNTER — Telehealth: Payer: Self-pay | Admitting: Pulmonary Disease

## 2017-04-27 NOTE — Telephone Encounter (Signed)
Spoke with patient wife, wants to know the setting the O2 is supposed to be set on.   Instructions  Return in about 4 months (around 08/24/2017) for Follow up with Dr. Vaughan Browner.  Prevnar vaccine today .  Increase Oxygen 4l/m with walking and 2l/m at rest .  Continue on current regimen  follow up Dr. Vaughan Browner in 4-6 months and As needed     Aware of rec's from last OV. Nothing further needed.

## 2017-04-28 NOTE — Addendum Note (Signed)
Addended by: Parke Poisson E on: 04/28/2017 10:52 AM   Modules accepted: Orders

## 2017-06-01 ENCOUNTER — Encounter: Payer: Self-pay | Admitting: Cardiology

## 2017-06-01 ENCOUNTER — Ambulatory Visit (INDEPENDENT_AMBULATORY_CARE_PROVIDER_SITE_OTHER): Payer: Medicare Other | Admitting: Cardiology

## 2017-06-01 VITALS — BP 136/62 | HR 54 | Ht 72.0 in | Wt 200.0 lb

## 2017-06-01 DIAGNOSIS — I779 Disorder of arteries and arterioles, unspecified: Secondary | ICD-10-CM

## 2017-06-01 DIAGNOSIS — I2119 ST elevation (STEMI) myocardial infarction involving other coronary artery of inferior wall: Secondary | ICD-10-CM | POA: Diagnosis not present

## 2017-06-01 DIAGNOSIS — I6523 Occlusion and stenosis of bilateral carotid arteries: Secondary | ICD-10-CM

## 2017-06-01 DIAGNOSIS — I5032 Chronic diastolic (congestive) heart failure: Secondary | ICD-10-CM

## 2017-06-01 DIAGNOSIS — I272 Pulmonary hypertension, unspecified: Secondary | ICD-10-CM | POA: Diagnosis not present

## 2017-06-01 DIAGNOSIS — I25119 Atherosclerotic heart disease of native coronary artery with unspecified angina pectoris: Secondary | ICD-10-CM | POA: Diagnosis not present

## 2017-06-01 DIAGNOSIS — I25700 Atherosclerosis of coronary artery bypass graft(s), unspecified, with unstable angina pectoris: Secondary | ICD-10-CM | POA: Diagnosis not present

## 2017-06-01 DIAGNOSIS — I110 Hypertensive heart disease with heart failure: Secondary | ICD-10-CM | POA: Diagnosis not present

## 2017-06-01 DIAGNOSIS — I10 Essential (primary) hypertension: Secondary | ICD-10-CM | POA: Diagnosis not present

## 2017-06-01 DIAGNOSIS — E785 Hyperlipidemia, unspecified: Secondary | ICD-10-CM | POA: Diagnosis not present

## 2017-06-01 DIAGNOSIS — I739 Peripheral vascular disease, unspecified: Secondary | ICD-10-CM

## 2017-06-01 MED ORDER — ROSUVASTATIN CALCIUM 10 MG PO TABS: 10.0000 mg | ORAL_TABLET | ORAL | 6 refills | Status: DC

## 2017-06-01 NOTE — Progress Notes (Signed)
PCP: Imagene Riches, NP  Clinic Note: Chief Complaint  Patient presents with  . Follow-up    Shortness of Breath when walking   . Coronary Artery Disease    HPI: Steven Cameron is a 82 y.o. male with a PMH below who presents today for ~6 month f/u for CAD-CABG, s/p Inf STEMI Jan 2017 -> PCI SVG-RI-CxOM (both limbs). Chronic HFpEF -> along with pulmonary fibrosis and pulmonary hypertension  CABG in 2004(SVG-RI-CxOM, SVG-D2, SVG-dRCA, LIMA-LAD),  Allegra Lai was last seen on in December 2017. His exertional baseline dyspnea was getting worse now. He is on home oxygen and walking with a walker. Significant problems with arthritis and neuropathy - essentially with bilateral foot drop.  No PND or orthopnea mild edema. She indicated being frustrated with going to pulmonary medicine is a continued to one and to do a 6 minute walk without his walker.  Recent Hospitalizations: none  Studies Personally Reviewed - (if available, images/films reviewed: From Epic Chart or Care Everywhere)  Carotid artery Dopplers March 2018: Stable 91-69% RICA, <45% LICA, > 03% Bilateral ECA. Normal subclavian and vertebral arteries bilaterally.  Interval History: Wilgus presents today for routine follow-up relatively stable with no major complaints. His oxygen levels have been turned up to 3 L at rest and 4 L with exertion. With this level of oxygen, he still has shortness breath when he walks around, but denies any anginal chest tightness or pressure with rest or exertion. He really doesn't walk around very much, partially because of his dyspnea, but partially also because of his and unsteady gait walking with a walker. He denies any PND, orthopnea with only minimal edema.  No palpitations, lightheadedness, dizziness, weakness or syncope/near syncope. No TIA/amaurosis fugax symptoms. No melena, hematochezia, hematuria, or epstaxis. No claudication.  ROS: A comprehensive was performed. Review of  Systems  Constitutional: Positive for malaise/fatigue (Chronic).  Respiratory: Positive for shortness of breath (Chronic).   Musculoskeletal: Positive for joint pain (Hips, knees and also foot drop). Negative for myalgias (Better since he stopped lovastatin).  Skin: Positive for rash (He has a dry scaly rash on the left foot and ankle. There is some component as a small red lesion.  ).       Baseline venous stasis changes on both legs  Neurological: Negative for dizziness.  Psychiatric/Behavioral: Negative for depression and memory loss. The patient is not nervous/anxious and does not have insomnia.   All other systems reviewed and are negative.  I have reviewed and (if needed) personally updated the patient's problem list, medications, allergies, past medical and surgical history, social and family history.   Past Medical History:  Diagnosis Date  . Anxiety disorder   . Arthritis   . Chronic diastolic heart failure secondary to coronary artery disease (HCC)    Elevated LVEDP noted on cardiac catheterization in setting of non-STEMI.  DD noted on echo January 2017  . Coronary artery disease involving autologous vein bypass graft 2017   Admitted for ACS/aborted inferior STEMI -- occluded SVG-RI-OM; PCI of proximal (Promus DES 3.5 x 32 -- 3.8 mm) and sequential limb from RI-OM (Promus DES 3.5 x 12--3.7 mm).    . Coronary artery disease involving native coronary artery with angina pectoris ([place]) 2004   a. s/p CABG in 2004 b. 12/2015 NSTEMI --> * 100% SVG-RI-OM (80% RI-OM limb). Ost LM 99%, ost-pLAD 100% &100% after grafted D2. Osti-RI  & ostCx100%. ost-pRCA 80%, dRCA 70%, Ostial rPDA 60%. SVG-dRCA, SVG-D2 and LIMA-LAD  patent.   Mod-severe LV dysfunction w/ mod-severely elevated LVEDP.  . Depression   . Foot drop, bilateral 03/22/2013  . GERD (gastroesophageal reflux disease)   . Gout   . Hyperlipidemia   . Hypertension   . Hypertensive heart disease with congestive heart failure and stage 2  kidney disease ([place])   . Neuropathy    In both legs, below knee  . Peripheral neuropathy    Probable  . Prostate cancer [facility])    Prostate  . Pulmonary fibrosis, unspecified ([place]) 2002   With interstitial lung disease.  . Pulmonary hypertension due to interstitial lung disease (HCC)    PA pressures by echocardiogram estimated at 70-78 mmHg. Is on home oxygen.    Past Surgical History:  Procedure Laterality Date  . CARDIAC CATHETERIZATION  09/24/2004   EF 60%  . CARDIAC CATHETERIZATION N/A 01/27/2016   Procedure: Left Heart Cath and Cors/Grafts Angiography;  Surgeon: Leonie Man, MD;  Location: [place] [facility];  Service: CV; * 100% SVG-RI-OM (80% RI-OM limb). Ost LM 99%, ost-pLAD 100% &100% after grafted D2. Osti-RI  & ostCx100%. ost-pRCA 80%, dRCA 70%, Ostial rPDA 60%. SVG-dRCA, SVG-D2 and LIMA-LAD patent.   Mod-severe LV dysfunction w/ mod-severely elevated LVEDP.  Marland Kitchen CARDIAC CATHETERIZATION N/A 01/27/2016   Procedure: Coronary Stent Intervention;  Surgeon: Leonie Man, MD;  Location: [place] [facility];  Service: Cardiovascular;  occluded SVG-RI-OM; PCI of proximal (Promus DES 3.5 mm x 32 mm -- 3.8 mm) & seq limb from RI-OM (Promus DES 3.67m x 166m-3.7 mm)  . CARDIOVASCULAR STRESS TEST  07/17/2010   EF 61%  . CATARACT EXTRACTION, BILATERAL    . CORONARY ARTERY BYPASS GRAFT  2004  . HEMORRHOID SURGERY    . radium seed implant     Prostate cancer  . TONSILLECTOMY    . TRANSTHORACIC ECHOCARDIOGRAM  Nov 2016; Jan 2017   a. Normal LV size, function, EF 60-65%.no RWMA. Gr 1 DD, mild LA dilation, mod RV dilation - PAP ~78 mmHg;;b.EF 50-55%. GR 1 DD. Mild MR. Mild-moderate RV dilation. PAP ~70 mmHg.  . Marland KitchenSKoreaCHOCARDIOGRAPHY  01/19/2008   EF 55-60%   Cardiac Cath - PCI 01/27/2016 Diagnostic Diagram       Post-Intervention Diagram          Current Meds  Medication Sig  . allopurinol (ZYLOPRIM) 100 MG tablet Take 1 tablet (100 mg total) by mouth 2 (two) times daily.  . Marland KitchenLPRAZolam  (XANAX) 0.5 MG tablet Take 0.025 mg by mouth 3 (three) times daily as needed for anxiety (anxiety).   . Marland Kitchenspirin 81 MG EC tablet Take 81 mg by mouth daily.    . B Complex-C (SUPER B COMPLEX PO) Take 1 tablet by mouth daily.  . cholecalciferol (VITAMIN D) 1000 UNITS tablet Take 1,000 Units by mouth daily.  . fluocinonide cream (LIDEX) 0.5.70 Apply 1 application topically daily as needed (itching).   . furosemide (LASIX) 20 MG tablet Take 1 tablet (20 mg total) by mouth daily.  . Glucosamine-Chondroitin 250-200 MG TABS Take 250 mg by mouth.  . Marland KitchenYDROcodone-acetaminophen (NORCO/VICODIN) 5-325 MG per tablet Take 0.5 tablets by mouth every 6 (six) hours as needed for moderate pain (pain).   . Marland Kitchenosartan-hydrochlorothiazide (HYZAAR) 100-12.5 MG tablet Take 0.5 tablets by mouth daily.  . metoprolol tartrate (LOPRESSOR) 25 MG tablet Take 1 tablet (25 mg total) by mouth 2 (two) times daily.  . Misc Natural Products (OSTEO BI-FLEX JOINT SHIELD PO) Take 1 tablet by mouth 2 (  two) times daily.   . nitroGLYCERIN (NITROSTAT) 0.4 MG SL tablet Place 1 tablet (0.4 mg total) under the tongue every 5 (five) minutes as needed for chest pain (x 3 doses). For chest pain  . omega-3 acid ethyl esters (LOVAZA) 1 G capsule Take 1 g by mouth 3 (three) times daily.  Marland Kitchen omeprazole (PRILOSEC) 20 MG capsule Take 20 mg by mouth 2 (two) times daily.  . Red Yeast Rice 600 MG CAPS Take 600 mg by mouth 2 (two) times daily.  . ticagrelor (BRILINTA) 90 MG TABS tablet Take 1 tablet (90 mg total) by mouth 2 (two) times daily.    Allergies  Allergen Reactions  . Lyrica [Pregabalin]     Gait instability  . Statins Other (See Comments)    Weakness of legs  . Ibuprofen Rash    Social History   Social History  . Marital status: Married    Spouse name: N/A  . Number of children: 2  . Years of education: 14   Occupational History  . Retired     retired   Social History Main Topics  . Smoking status: Former Smoker    Packs/day:  3.00    Years: 25.00    Types: Cigarettes    Quit date: 08/01/1991  . Smokeless tobacco: Never Used  . Alcohol use No  . Drug use: No  . Sexual activity: Not Asked   Other Topics Concern  . None   Social History Narrative   Patient lives at home with his wife Mikle Bosworth).   Retired.    Education some college.Right handed.   Caffeine coffee four cups .        family history includes Alzheimer's disease (age of onset: 89) in his mother; Coronary artery disease in his father; Prostate cancer in his father.  Wt Readings from Last 3 Encounters:  06/01/17 200 lb (90.7 kg)  04/24/17 204 lb (92.5 kg)  01/29/17 200 lb (90.7 kg)    PHYSICAL EXAM BP 136/62   Pulse (!) 54   Ht 6' (1.829 m)   Wt 200 lb (90.7 kg)   BMI 27.12 kg/m  General appearance: alert, cooperative, appears stated age, no distress. Well-nourished, well-groomed. On home oxygen. HEENT: [place]/AT, EOMI, MMM, anicteric sclera Neck: no adenopathy, no carotid bruit and no JVD Lungs: clear to auscultation bilaterally, normal percussion bilaterally and non-labored Heart: regular rate and rhythm, S1 &S2 normal, no murmur, click, rub or gallop; nondisplaced PMI Abdomen: soft, non-tender; bowel sounds normal; no masses,  no organomegaly; no HJR Extremities: extremities normal, atraumatic, no cyanosis, and edema - trivial Pulses: 2+ and symmetric;  Skin: mobility and turgor normal and no evidence of bleeding or bruising Neurologic: Mental status: Alert & oriented x 3, thought content appropriate; non-focal exam.  Pleasant mood & affect. Walks with a walker. He has a notable foot drop    Adult ECG Report  Rate: 54 ;  Rhythm: normal sinus rhythm and normal axis, intervals and durations.;   Narrative Interpretation: stable EKG   Other studies Reviewed: Additional studies/ records that were reviewed today include:  Recent Labs:   From PCP 04/30/2017 Lab Results  Component Value Date   CHOL 133 06/10/2016   HDL 35 (L)  06/10/2016   LDLCALC 76 06/10/2016   TRIG 109 06/10/2016   CHOLHDL 3.8 06/10/2016   Lab Results  Component Value Date   CREATININE 1.09 06/10/2016   BUN 22 06/10/2016   NA 139 06/10/2016   K 4.7 06/10/2016  CL 99 06/10/2016   CO2 27 06/10/2016    ASSESSMENT / PLAN: Problem List Items Addressed This Visit    Bilateral carotid artery disease (HCC) (Chronic)    Relatively stable Dopplers. Follow-up annually      Relevant Medications   rosuvastatin (CRESTOR) 10 MG tablet   CAD (coronary artery disease) of artery bypass graft (Chronic)    2 site PCI in both limbs of the SVG-RI-OM with otherwise patent SVG-distal RCA, LIMA-LAD.  He is on stable dose of beta blocker as best tolerated with his lung disease. On aspirin plus Brilinta - Okay to DC aspirin  Based on previous discussion, he would prefer to forego surveillance stress testing in the future, and only evaluate if symptoms arise.      Relevant Medications   rosuvastatin (CRESTOR) 10 MG tablet   Other Relevant Orders   EKG 12-Lead   Comprehensive metabolic panel   Coronary artery disease involving native heart with angina pectoris (HCC) - Primary (Chronic)    Severe disease of native vessels with minimal antegrade flow.  Distal vessels beyond the anastomosis of the  grafts are all relatively normal with exception of the ostial PDA. No recurrent anginal symptoms.  On aspirin and Brilinta. -> Okay to discontinue aspirin On beta blocker and ARB No longer on statin -> starting Crestor 10 mg every other day      Relevant Medications   rosuvastatin (CRESTOR) 10 MG tablet   Other Relevant Orders   EKG 12-Lead   Dyslipidemia, goal LDL below 70 (Chronic)    He was switched from Lipitor to lovastatin and Lovaza. Last labs from June 2017 look good.he should be due for labs now -> Usually followed by his PCP. Most recent lipids show that he clearly has not been taking a statin as his LDL is up to 136 now. - He stopped  lovastatin because of leg fatigue. Plan Will restart low-dose Crestor being getting it once weekly and then titrating up to one every other day --> recheck labs in 6 months      Relevant Medications   rosuvastatin (CRESTOR) 10 MG tablet   Other Relevant Orders   Lipid panel   Comprehensive metabolic panel   Essential hypertension (Chronic)    Blood pressure is relatively stable today. For now will continue current medication and not uptitrate. On stable dose of losartan and metoprolol tartrate.      Relevant Medications   rosuvastatin (CRESTOR) 10 MG tablet   Other Relevant Orders   EKG 12-Lead   Comprehensive metabolic panel   Hypertensive heart disease (Chronic)    Preserved EF with diastolic dysfunction and elevated LVEDP which combines with existing lung disease for worsening pulmonary hypertension. CG clinical exam today with his standing dose of furosemide plus the HCTZ portion of Hyzaar.  We discussed when necessary dosing of additional Lasix based on sliding scale (weight gain more than 3 pounds)       Relevant Medications   rosuvastatin (CRESTOR) 10 MG tablet   Moderate to severe pulmonary hypertension (HCC) (Chronic)    Mostly related to pulmonary disease. He is on home oxygen which is the best treatment for this.  Based on his been much end-stage pulmonary fibrosis, probably would not pursue right heart catheterization and consideration of pulmonary hypertension therapies.      Relevant Medications   rosuvastatin (CRESTOR) 10 MG tablet   Other Relevant Orders   EKG 12-Lead   Comprehensive metabolic panel   STEMI involving oth coronary artery  of inferior wall (HCC) (Chronic)    No recurrent anginal symptoms. Preserved EF following PCI of 2 lesions in the SVG-RI-OM      Relevant Medications   rosuvastatin (CRESTOR) 10 MG tablet      Current medicines are reviewed at length with the patient today. (+/- concerns) n/a The following changes have been made: add  Crestor  Patient Instructions  Medication   Start Crestor one tablet a week for 1 month ,then increase by one day a week for each month until you are taking a tablet every other day .    Labs in 6 months - do not eat or drink the drinking. CMP LIPID  WILL MAIL YOU A LABSLIP    Your physician wants you to follow-up in [place]. You will receive a reminder letter in the mail two months in advance. If you don't receive a letter, please call our office to schedule the follow-up appointment.    If you need a refill on your cardiac medications before your next appointment, please call your pharmacy.      Studies Ordered:   Orders Placed This Encounter  Procedures  . Lipid panel  . Comprehensive metabolic panel  . EKG 12-Lead      Glenetta Hew, M.D., M.S. Interventional Cardiologist   Pager # [phone] Phone # [phone] [address] [place], [place] [address]

## 2017-06-01 NOTE — Assessment & Plan Note (Signed)
2 site PCI in both limbs of the SVG-RI-OM with otherwise patent SVG-distal RCA, LIMA-LAD.  He is on stable dose of beta blocker as best tolerated with his lung disease. On aspirin plus Brilinta - Okay to DC aspirin  Based on previous discussion, he would prefer to forego surveillance stress testing in the future, and only evaluate if symptoms arise.

## 2017-06-01 NOTE — Assessment & Plan Note (Signed)
No recurrent anginal symptoms. Preserved EF following PCI of 2 lesions in the SVG-RI-OM

## 2017-06-01 NOTE — Assessment & Plan Note (Signed)
Severe disease of native vessels with minimal antegrade flow.  Distal vessels beyond the anastomosis of the  grafts are all relatively normal with exception of the ostial PDA. No recurrent anginal symptoms.  On aspirin and Brilinta. -> Okay to discontinue aspirin On beta blocker and ARB No longer on statin -> starting Crestor 10 mg every other day

## 2017-06-01 NOTE — Assessment & Plan Note (Signed)
Blood pressure is relatively stable today. For now will continue current medication and not uptitrate. On stable dose of losartan and metoprolol tartrate.

## 2017-06-01 NOTE — Assessment & Plan Note (Signed)
Preserved EF with diastolic dysfunction and elevated LVEDP which combines with existing lung disease for worsening pulmonary hypertension. CG clinical exam today with his standing dose of furosemide plus the HCTZ portion of Hyzaar.  We discussed when necessary dosing of additional Lasix based on sliding scale (weight gain more than 3 pounds)

## 2017-06-01 NOTE — Assessment & Plan Note (Signed)
Mostly related to pulmonary disease. He is on home oxygen which is the best treatment for this.  Based on his been much end-stage pulmonary fibrosis, probably would not pursue right heart catheterization and consideration of pulmonary hypertension therapies.

## 2017-06-01 NOTE — Assessment & Plan Note (Signed)
Relatively stable Dopplers. Follow-up annually

## 2017-06-01 NOTE — Assessment & Plan Note (Addendum)
He was switched from Lipitor to lovastatin and Lovaza. Last labs from June 2017 look good.he should be due for labs now -> Usually followed by his PCP. Most recent lipids show that he clearly has not been taking a statin as his LDL is up to 136 now. - He stopped lovastatin because of leg fatigue. Plan Will restart low-dose Crestor being getting it once weekly and then titrating up to one every other day --> recheck labs in 6 months

## 2017-06-01 NOTE — Patient Instructions (Signed)
Medication   Start Crestor one tablet a week for 1 month ,then increase by one day a week for each month until you are taking a tablet every other day .    Labs in 6 months - do not eat or drink the drinking. CMP LIPID  WILL MAIL YOU A LABSLIP    Your physician wants you to follow-up in [place]. You will receive a reminder letter in the mail two months in advance. If you don't receive a letter, please call our office to schedule the follow-up appointment.    If you need a refill on your cardiac medications before your next appointment, please call your pharmacy.

## 2017-06-11 ENCOUNTER — Telehealth: Payer: Self-pay | Admitting: Cardiology

## 2017-06-11 NOTE — Telephone Encounter (Signed)
Spoke with pt's wife states that pt is swelling in his LE she states that pt is taking all medications as ordered and is taking lasix 35m daily. Denies any other sx, sob, chest pain or pressure, nausea,  Or dizziness. She states that she will take pt's weight today and then give him extra lasix x3 days only and will take his weight daily to track his weight and she was informed not to take extra lasix if pt's weight comes down. She will call back Monday with results of taking the extra lasix. Informed to go to the ER if new sx develop or worsen.

## 2017-06-11 NOTE — Telephone Encounter (Signed)
New message   Pt wife is calling about pt. Pt c/o swelling: STAT is pt has developed SOB within 24 hours  1. How long have you been experiencing swelling? A week, but it's getting worse.  2. Where is the swelling located? Feet   3.  Are you currently taking a "fluid pill"? Yes-pt wife thinks they were told to increase if swelling began, but they can not remember.  4.  Are you currently SOB? No   5.  Have you traveled recently? No

## 2017-06-12 ENCOUNTER — Telehealth: Payer: Self-pay | Admitting: Cardiology

## 2017-06-12 NOTE — Telephone Encounter (Signed)
Spoke to wife, listed DPR. She reports swelling in legs, as noted yesterday (see 06/11/17 triage call). Pt took extra lasix yesterday at our instruction, was down 1 lb of fluid this AM but still having swelling in ankles. No SOB, dizziness, fatigue, etc - swelling is only reported symptom per wife, consistent w yesterday's report.  [facility] LPN gave her the advice to increase lasix from 9m daily dose to 466mx3 days only, then resume 2042mose. I affirmed this instruction - wife reports pt took 84m43msix today -- I advised to give pt addtl 84mg28may and do 40mg 68m tomorrow.  Aware I'll route to Dr. HardinEllyn Hackis review.  Will plan to call wife back w any instruction for labwork or further med instructions. She expressed acknowledgment and thanks.

## 2017-06-12 NOTE — Telephone Encounter (Signed)
New message    Pt wife states he lost a pound over the last week and was told to let Dr. Ellyn Hack or a nurse know.

## 2017-06-13 NOTE — Telephone Encounter (Signed)
That sounds a good plan. Let's see how he does after the weekend with increased dose of Lasix. If necessary can take 40 twice a day, but I would like to have a potassium evaluation prior to making that adjustment.  Glenetta Hew, MD

## 2017-06-16 NOTE — Telephone Encounter (Signed)
Called patient back. He reports he's doing well & swelling is gone, has had no need of extra lasix since Saturday. He's aware to call back should he have return of symptoms or weight gain, and we will be happy to advise further as needed. He expressed thanks for follow up call.

## 2017-07-03 ENCOUNTER — Telehealth: Payer: Self-pay | Admitting: Cardiology

## 2017-07-03 NOTE — Telephone Encounter (Signed)
Steven Cameron is calling to speak with you about the Crestor . He has stop taking them..  Please call    Thanks

## 2017-07-03 NOTE — Telephone Encounter (Signed)
Returned the phone call to the patient. He stated that he has stopped taking Crestor 10 mg. He started experiencing muscle pain to the extent that he could not walk. According to the notes he should have taken it:  Start Crestor one tablet a week for 1 month ,then increase by one day a week for each month until you are taking a tablet every other day .  He took it the first week on Monday, the second week on Mon and Wed, and the third week on Mon, Wed, Fri.   He would like to know if he may try something else. He does not want to try the Crestor anymore due to the pain. Will route to the provider and pharmd for their recommendation.

## 2017-07-06 NOTE — Telephone Encounter (Signed)
Patient reports he didn't understood instructions for crestor and willing to re-try.  Instructed to wait 2 weeks for leg pain resolution,then resume at 1 tablet every Monday for 1 month. If able to tolerate increase dose to 1 tablets every Monday and Friday for another month.  After 2 months on crestor 49m, appropriate to repeat Lipid panel.

## 2017-07-06 NOTE — Telephone Encounter (Signed)
Sounds reasonable  The plan was for him to f/u with [facility] if he could not tolerate statin.  Glenetta Hew, MD

## 2017-08-21 IMAGING — CR DG CHEST 1V PORT
1 series · 1 of 1 positions shown · non-contrast
Comparison: 01/14/2016

CLINICAL DATA: Hypertension and coronary artery disease. Follow-up.

EXAM:
PORTABLE CHEST 1 VIEW

[AP]
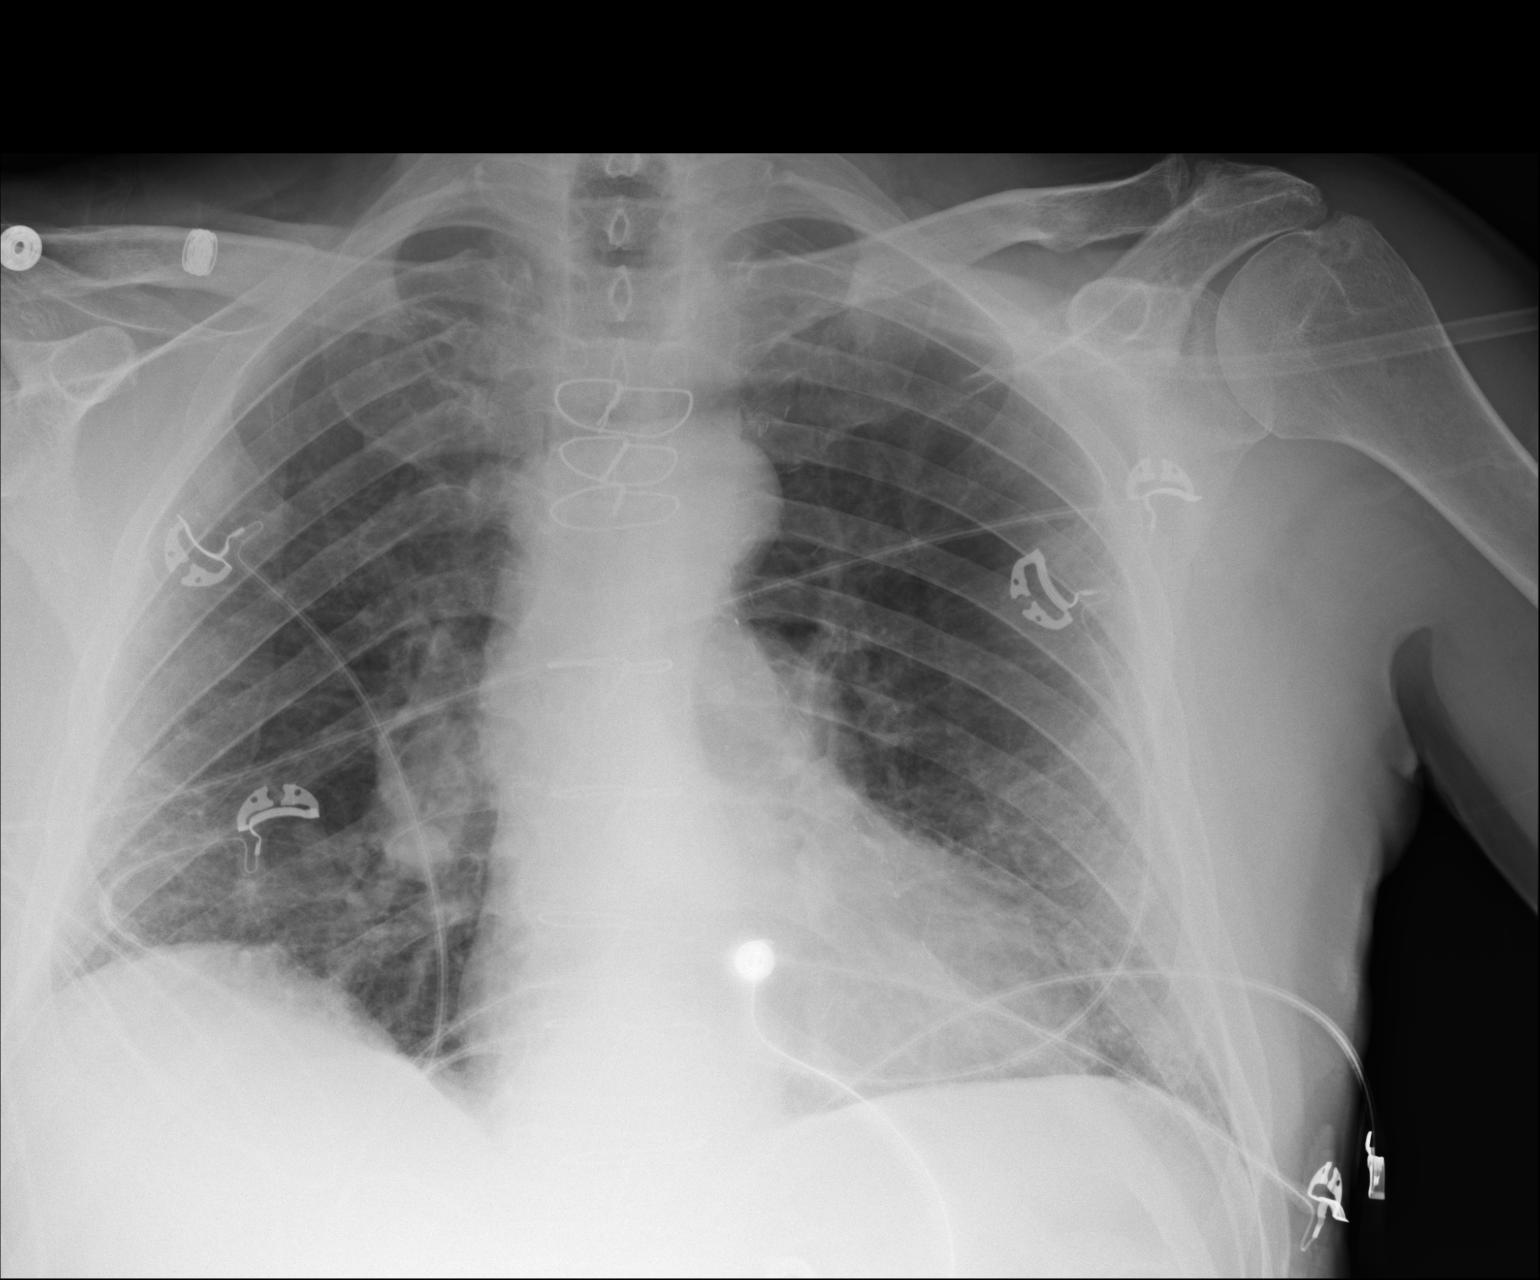

[1 of 1 positions shown; findings below may reference images not displayed]

FINDINGS: Previous median sternotomy and CABG. Heart size is normal. The aorta
is unfolded. There is chronic scarring of both lung bases. No sign
of active infiltrate, effusion or collapse. No edema.
IMPRESSION: No active disease.  Previous CABG.  Basilar pulmonary scarring.

## 2017-09-01 ENCOUNTER — Ambulatory Visit: Payer: Medicare Other | Admitting: Pulmonary Disease

## 2017-09-14 ENCOUNTER — Encounter: Payer: Self-pay | Admitting: Pulmonary Disease

## 2017-09-14 ENCOUNTER — Ambulatory Visit (INDEPENDENT_AMBULATORY_CARE_PROVIDER_SITE_OTHER): Payer: Medicare Other | Admitting: Pulmonary Disease

## 2017-09-14 VITALS — BP 122/68 | HR 63 | Ht 72.0 in | Wt 197.1 lb

## 2017-09-14 DIAGNOSIS — R0902 Hypoxemia: Secondary | ICD-10-CM | POA: Diagnosis not present

## 2017-09-14 DIAGNOSIS — I6523 Occlusion and stenosis of bilateral carotid arteries: Secondary | ICD-10-CM

## 2017-09-14 DIAGNOSIS — J84112 Idiopathic pulmonary fibrosis: Secondary | ICD-10-CM | POA: Diagnosis not present

## 2017-09-14 DIAGNOSIS — I272 Pulmonary hypertension, unspecified: Secondary | ICD-10-CM | POA: Diagnosis not present

## 2017-09-14 NOTE — Progress Notes (Signed)
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 is a 82 year old with history of hypertension, coronary artery disease, gout. Admitted to [facility] on 11/08/15 with dyspnea, hypoxemia, and nSTEMI. Found to have pulmonary fibrosis on CT and severe pulmonary hypertension.  He has history of gout and was on colchicine in the past. This was stopped secondary to neuropathy. He is currently on allopurinol. He also has history of osteoarthritis, diastolic congestive heart failure, coronary artery disease. He worked in various jobs initially in a Copy. 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 He may have had significant exposure to asbestos during this time. 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 He lives with his wife. He has a 75-pack-year history of smoking. Quit around 1995.  This is significant family history of pulmonary fibrosis on his mother's side. His mother, aunt and cousins all suffered from pulmonary fibrosis. He was told that his mother developed pulmonary fibrosis from medication for arthritis. He does not recall what this medication is. His family was enrolled in a gene study for pulmonary fibrosis at [facility] about 5 years ago. He was told that his family had a susceptibility gene for primary fibrosis. He does not know if he carries a gene as well and he does not know what this gene is. There is history of prostate cancer on his father's side.   He denies any other exposures except for asbestos. He denies any joint pain except for gout and osteoarthritis. He denies any signs and symptoms of connective tissue disease or autoimmune disease. I had recommended a workup  but he initially refused and was discharged. However he returns now for follow-up accompanied by his wife. He was evaluated for oxygen and discharged on 2 L O2 24/7.  Interim history: He has dyspnea on exertion which is unchanged. Continues on supplemental oxygen.  Outpatient Encounter Prescriptions as of 09/14/2017  Medication Sig  . allopurinol (ZYLOPRIM) 100 MG tablet Take 1 tablet (100 mg total) by mouth 2 (two) times daily.  Marland Kitchen ALPRAZolam (XANAX) 0.5 MG tablet Take 0.025 mg by mouth 3 (three) times daily as needed for anxiety (anxiety).   . B Complex-C (SUPER B COMPLEX PO) Take 1 tablet by mouth daily.  . cholecalciferol (VITAMIN D) 1000 UNITS tablet Take 1,000 Units by mouth daily.  . fluocinonide cream (LIDEX) 1.91 % Apply 1 application topically daily as needed (itching).   . furosemide (LASIX) 20 MG tablet Take 1 tablet (20 mg total) by mouth daily.  . Glucosamine-Chondroitin 250-200 MG TABS Take 250 mg by mouth.  Marland Kitchen HYDROcodone-acetaminophen (NORCO/VICODIN) 5-325 MG per tablet Take 0.5 tablets by mouth every 6 (six) hours as needed for moderate pain (pain).   Marland Kitchen losartan-hydrochlorothiazide (HYZAAR) 100-12.5 MG tablet Take 0.5 tablets by mouth daily.  . metoprolol tartrate (LOPRESSOR) 25 MG tablet Take 1 tablet (25 mg total) by mouth 2 (two) times daily.  . Misc Natural Products (OSTEO BI-FLEX JOINT SHIELD PO) Take 1 tablet by mouth 2 (two) times daily.   . nitroGLYCERIN (NITROSTAT) 0.4 MG SL tablet Place 1 tablet (0.4 mg total) under the tongue every 5 (five) minutes  as needed for chest pain (x 3 doses). For chest pain  . omega-3 acid ethyl esters (LOVAZA) 1 G capsule Take 1 g by mouth 3 (three) times daily.  Marland Kitchen omeprazole (PRILOSEC) 20 MG capsule Take 20 mg by mouth 2 (two) times daily.  . Red Yeast Rice 600 MG CAPS Take 600 mg by mouth 2 (two) times daily.  . ticagrelor (BRILINTA) 90 MG TABS tablet Take 1 tablet (90 mg total) by mouth 2 (two) times daily.  . rosuvastatin (CRESTOR) 10  MG tablet Take 1 tablet (10 mg total) by mouth every other day.  . [DISCONTINUED] aspirin 81 MG EC tablet Take 81 mg by mouth daily.     No facility-administered encounter medications on file as of 09/14/2017.     Allergies as of 09/14/2017 - Review Complete 09/14/2017  Allergen Reaction Noted  . Lyrica [pregabalin]  09/22/2013  . Statins Other (See Comments)   . Ibuprofen Rash 08/01/2011    Past Medical History:  Diagnosis Date  . Anxiety disorder   . Arthritis   . Chronic diastolic heart failure secondary to coronary artery disease (HCC)    Elevated LVEDP noted on cardiac catheterization in setting of non-STEMI.  DD noted on echo January 2017  . Coronary artery disease involving autologous vein bypass graft 2017   Admitted for ACS/aborted inferior STEMI -- occluded SVG-RI-OM; PCI of proximal (Promus DES 3.5 x 32 -- 3.8 mm) and sequential limb from RI-OM (Promus DES 3.5 x 12--3.7 mm).    . Coronary artery disease involving native coronary artery with angina pectoris ([place]) 2004   a. s/p CABG in 2004 b. 12/2015 NSTEMI --> * 100% SVG-RI-OM (80% RI-OM limb). Ost LM 99%, ost-pLAD 100% &100% after grafted D2. Osti-RI  & ostCx100%. ost-pRCA 80%, dRCA 70%, Ostial rPDA 60%. SVG-dRCA, SVG-D2 and LIMA-LAD patent.   Mod-severe LV dysfunction w/ mod-severely elevated LVEDP.  . Depression   . Foot drop, bilateral 03/22/2013  . GERD (gastroesophageal reflux disease)   . Gout   . Hyperlipidemia   . Hypertension   . Hypertensive heart disease with congestive heart failure and stage 2 kidney disease ([place])   . Neuropathy    In both legs, below knee  . Peripheral neuropathy    Probable  . Prostate cancer [facility])    Prostate  . Pulmonary fibrosis, unspecified ([place]) 2002   With interstitial lung disease.  . Pulmonary hypertension due to interstitial lung disease (HCC)    PA pressures by echocardiogram estimated at 70-78 mmHg. Is on home oxygen.    Past Surgical History:  Procedure Laterality Date    . CARDIAC CATHETERIZATION  09/24/2004   EF 60%  . CARDIAC CATHETERIZATION N/A 01/27/2016   Procedure: Left Heart Cath and Cors/Grafts Angiography;  Surgeon: Leonie Man, MD;  Location: [place] [facility];  Service: CV; * 100% SVG-RI-OM (80% RI-OM limb). Ost LM 99%, ost-pLAD 100% &100% after grafted D2. Osti-RI  & ostCx100%. ost-pRCA 80%, dRCA 70%, Ostial rPDA 60%. SVG-dRCA, SVG-D2 and LIMA-LAD patent.   Mod-severe LV dysfunction w/ mod-severely elevated LVEDP.  Marland Kitchen CARDIAC CATHETERIZATION N/A 01/27/2016   Procedure: Coronary Stent Intervention;  Surgeon: Leonie Man, MD;  Location: [place] [facility];  Service: Cardiovascular;  occluded SVG-RI-OM; PCI of proximal (Promus DES 3.5 mm x 32 mm -- 3.8 mm) & seq limb from RI-OM (Promus DES 3.54m x 121m-3.7 mm)  . CARDIOVASCULAR STRESS TEST  07/17/2010   EF 61%  . CATARACT EXTRACTION, BILATERAL    .  CORONARY ARTERY BYPASS GRAFT  2004  . HEMORRHOID SURGERY    . radium seed implant     Prostate cancer  . TONSILLECTOMY    . TRANSTHORACIC ECHOCARDIOGRAM  Nov 2016; Jan 2017   a. Normal LV size, function, EF 60-65%.no RWMA. Gr 1 DD, mild LA dilation, mod RV dilation - PAP ~78 mmHg;;b.EF 50-55%. GR 1 DD. Mild MR. Mild-moderate RV dilation. PAP ~70 mmHg.  Marland Kitchen US ECHOCARDIOGRAPHY  01/19/2008   EF 55-60%    Family History  Problem Relation Age of Onset  . Alzheimer's disease Mother 4  . Coronary artery disease Father   . Prostate cancer Father     Social History   Social History  . Marital status: Married    Spouse name: N/A  . Number of children: 2  . Years of education: 14   Occupational History  . Retired     retired   Social History Main Topics  . Smoking status: Former Smoker    Packs/day: 3.00    Years: 25.00    Types: Cigarettes    Quit date: 08/01/1991  . Smokeless tobacco: Never Used  . Alcohol use No  . Drug use: No  . Sexual activity: Not on file   Other Topics Concern  . Not on file   Social History Narrative    Patient lives at home with his wife Mikle Bosworth).   Retired.    Education some college.Right handed.   Caffeine coffee four cups .       Review of systems: Review of Systems  Constitutional: Negative for fever and chills.  HENT: Negative.   Eyes: Negative for blurred vision.  Respiratory: as per HPI  Cardiovascular: Negative for chest pain and palpitations.  Gastrointestinal: Negative for vomiting, diarrhea, blood per rectum. Genitourinary: Negative for dysuria, urgency, frequency and hematuria.  Musculoskeletal: Negative for myalgias, back pain and joint pain.  Skin: Negative for itching and rash.  Neurological: Negative for dizziness, tremors, focal weakness, seizures and loss of consciousness.  Endo/Heme/Allergies: Negative for environmental allergies.  Psychiatric/Behavioral: Negative for depression, suicidal ideas and hallucinations.  All other systems reviewed and are negative.   Physical Exam: Blood pressure 122/68, pulse 63, height 6' (1.829 m), weight 197 lb 2 oz (89.4 kg), SpO2 94 %. Gen:      No acute distress HEENT:  EOMI, sclera anicteric Neck:     No masses; no thyromegaly Lungs:    crackles; normal respiratory effort CV:         Regular rate and rhythm; no murmurs Abd:      + bowel sounds; soft, non-tender; no palpable masses, no distension Ext:    No edema; adequate peripheral perfusion Skin:      Warm and dry; no rash Neuro: alert and oriented x 3 Psych: normal mood and affect  Data Reviewed: Imaging CT scan 11/08/15 No PEs, Basal fibrosis reticulation. Images reviewed  CT scan had resolution 12/09/16-subpleural reticulation, peripheral bronchiectasis, traction bronchiectasis, early honeycombing. Probable UIP pattern. Images reviewed.  Echo (11/09/15) - Left ventricle: The cavity size was normal. Wall thickness was normal. Systolic function was normal. The estimated ejection fraction was in the range of 60% to 65%. Wall motion was normal; there  were no regional wall motion abnormalities. Doppler parameters are consistent with abnormal left ventricular relaxation (grade 1 diastolic dysfunction). - Left atrium: The atrium was mildly dilated. - Right ventricle: The cavity size was moderately dilated. Systolic function was moderately reduced. - Right atrium: The atrium was moderately  dilated. - Pulmonary arteries: Systolic pressure was severely increased. PA peak pressure: 78 mm Hg (S).  Serologies 12/18/15 Negative for ANA, Jo1, CCP, RF, SSA, SSB, Scl 70  PFTs 12/15/16 FVC 3.93 [89%) FEV1 2.90 (91%) F/F 74 TLC 82% DLCO 28%  Minimal obstruction with severe diffusion defect.  Assessment:  Pulmonary fibrosis, Likely IPF Mr. Mcbroom appears to have a familial form of pulmonary fibrosis. There is strong history of pulmonary fibrosis on his mother's side. His family was in fact enrolled in a study from [place] and was told that the family carries a gene for the fibrosis. He himself was not tested and does not know if he carries a gene as well. I have reviewed his latest pulmonary function tests and high-resolution CT. There is progression of fibrosis in a pattern that's probable UIP. This is likely IPF. Although there is exposure to asbestos, the progression over time and lack of pleural plaques makes it less likely. He does not have any signs and symptoms of rheumatic, autoimmune disease and serologies are negative. There are no exposures to suggest hypersensitivity.  He has declined anti-fibrotic therapy. Will continue supportive care with supplemental oxygen. We discussed pulmonary rehabilitation but he is not interested He does not want to join the IPF registry Sats are okay on ambulation today. We'll order a portable concentrator   Pulmonary hypertension.  His pulmonary hypertension is likely related to his ILD. There is no evidence of PEs on the CTA. I'll hold off on starting any pulmonary vasodilators as it may make the  V/Q mismatch worse.  Code status: I had a good discussion with Mr Botto and his daughter today. Discussed the prognosis, trajectory of disease. He remains clear that he would not want any aggressive treatment or therapies for his lung fibrosis. His code status is DNR We will continue to focus on symptom management. If he should decline then they're open to considering palliative or hospice but he's not ready for it yet.  Plan/Recommendations: - Supplemental O2, portable concentrator - Supportive care - DNR  More then 1/2 the time of the 40 min visit was spent in counseling and/or coordination of care with the patient and family. Marshell Garfinkel MD [facility] Pager [phone] 09/14/2017, 9:48 AM  CC: Imagene Riches, NP

## 2017-09-14 NOTE — Patient Instructions (Addendum)
Continue supplemental oxygen.  We will check your O2 saturations on ambulation Will order a portable oxygen concentrator Follow-up in one year.

## 2017-09-25 ENCOUNTER — Telehealth: Payer: Self-pay | Admitting: Pulmonary Disease

## 2017-09-25 NOTE — Telephone Encounter (Signed)
° ° °  This is from [place] @ [facility]

## 2017-09-25 NOTE — Telephone Encounter (Signed)
Called and spoke with the pts daughter and she stated that the pt did not understand what the test was that [facility] called him about.  She stated that he does need this and she will call [place] this morning to see if it can still be done.  I sent a message to [place] with [facility] to make him aware.

## 2017-09-25 NOTE — Telephone Encounter (Signed)
Staff message sent to [facility] to check on this order.

## 2017-11-23 ENCOUNTER — Telehealth: Payer: Self-pay | Admitting: Pulmonary Disease

## 2017-11-23 DIAGNOSIS — J84112 Idiopathic pulmonary fibrosis: Secondary | ICD-10-CM

## 2017-11-23 NOTE — Telephone Encounter (Signed)
lmtcb x1 for pt.

## 2017-11-23 NOTE — Telephone Encounter (Signed)
Spoke with wife, she states his nose bleeds a lot on the oxygen and when he blows his nose dried blood comes out. Is there anything to help with this? PM please advise.

## 2017-11-24 NOTE — Telephone Encounter (Signed)
Called pt and advised message from the provider. Pt understood and verbalized understanding. Nothing further is needed.   Order placed for humidifier

## 2017-11-24 NOTE — Telephone Encounter (Signed)
We can add a humidifier to O2? Order from DME. Thanks

## 2017-11-30 ENCOUNTER — Encounter: Payer: Self-pay | Admitting: Cardiology

## 2017-11-30 ENCOUNTER — Ambulatory Visit (INDEPENDENT_AMBULATORY_CARE_PROVIDER_SITE_OTHER): Payer: Medicare Other | Admitting: Cardiology

## 2017-11-30 VITALS — BP 152/64 | HR 60 | Ht 72.0 in | Wt 198.4 lb

## 2017-11-30 DIAGNOSIS — I6523 Occlusion and stenosis of bilateral carotid arteries: Secondary | ICD-10-CM | POA: Diagnosis not present

## 2017-11-30 DIAGNOSIS — I25119 Atherosclerotic heart disease of native coronary artery with unspecified angina pectoris: Secondary | ICD-10-CM | POA: Diagnosis not present

## 2017-11-30 DIAGNOSIS — J84112 Idiopathic pulmonary fibrosis: Secondary | ICD-10-CM

## 2017-11-30 DIAGNOSIS — R0609 Other forms of dyspnea: Secondary | ICD-10-CM | POA: Diagnosis not present

## 2017-11-30 DIAGNOSIS — I272 Pulmonary hypertension, unspecified: Secondary | ICD-10-CM

## 2017-11-30 DIAGNOSIS — I110 Hypertensive heart disease with heart failure: Secondary | ICD-10-CM | POA: Diagnosis not present

## 2017-11-30 DIAGNOSIS — I209 Angina pectoris, unspecified: Secondary | ICD-10-CM | POA: Diagnosis not present

## 2017-11-30 DIAGNOSIS — E785 Hyperlipidemia, unspecified: Secondary | ICD-10-CM

## 2017-11-30 DIAGNOSIS — I739 Peripheral vascular disease, unspecified: Secondary | ICD-10-CM

## 2017-11-30 DIAGNOSIS — I779 Disorder of arteries and arterioles, unspecified: Secondary | ICD-10-CM

## 2017-11-30 DIAGNOSIS — I5032 Chronic diastolic (congestive) heart failure: Secondary | ICD-10-CM | POA: Diagnosis not present

## 2017-11-30 DIAGNOSIS — I10 Essential (primary) hypertension: Secondary | ICD-10-CM

## 2017-11-30 NOTE — Progress Notes (Signed)
PCP: Imagene Riches, NP  Clinic Note: Chief Complaint  Patient presents with  . Follow-up    no chest pain  . Coronary Artery Disease    HPI: Steven Cameron is a 82 y.o. male with a PMH below who presents today for ~6 month f/u for CAD-CABG, s/p Inf STEMI Jan 2017 -> PCI SVG-RI-CxOM (both limbs). Chronic HFpEF -> along with pulmonary fibrosis and pulmonary hypertension  CABG in 2004(SVG-RI-CxOM, SVG-D2, SVG-dRCA, LIMA-LAD).  ANGELGABRIEL WILLMORE was last seen on in June2018. His exertional baseline dyspnea was getting worse now. He is on home oxygen and walking with a walker. Significant problems with arthritis and neuropathy - essentially with bilateral foot drop.  No PND or orthopnea mild edema.  Was frustrated with not being able to do pulmonary rehab, because he could not walk without his walker.  Recent Hospitalizations: none  Studies Personally Reviewed - (if available, images/films reviewed: From Epic Chart or Care Everywhere)  None  Interval History: Celvin presents today for routine follow-up noting his stable baseline seems to be progressively worsening from his chronic lung disease.  He remains on oxygen at 4 L.  But basically says he is more limited by his legs giving out and getting week despite using a walker prior to his being short of breath.  He says his legs hurt all the time and get worse with activity.  He also notes some peripheral neuropathy symptoms. Otherwise he really denies any of his cardiac related chest discomfort.  No chest tightness or pressure at rest or exertion.  No PND or orthopnea with minimal edema.  Edema is pretty well controlled with support stockings Despite having his dyspnea, he really is not sedentary.  He tries to walk around using a walker. No rapid irregular heartbeats or palpitations.  No syncope/near syncope or TIA/amaurosis fugax. He describes leg discomfort with walking, but it sounds more like musculoskeletal than  claudication.  ROS: A comprehensive was performed. Review of Systems  Constitutional: Positive for malaise/fatigue (Chronic).  Respiratory: Positive for shortness of breath (Chronic).   Musculoskeletal: Positive for joint pain (Hips, knees and also foot drop). Negative for falls and myalgias (Better since he stopped lovastatin).  Skin: Negative for rash (He has a dry scaly rash on the left foot and ankle. There is some component as a small red lesion.  ).       Baseline venous stasis changes on both legs -stable/improved with support stockings  Neurological: Negative for dizziness.  Psychiatric/Behavioral: Negative for depression and memory loss. The patient is not nervous/anxious and does not have insomnia.   All other systems reviewed and are negative.  I have reviewed and (if needed) personally updated the patient's problem list, medications, allergies, past medical and surgical history, social and family history.   Past Medical History:  Diagnosis Date  . Anxiety disorder   . Arthritis   . Chronic diastolic heart failure secondary to coronary artery disease (HCC)    Elevated LVEDP noted on cardiac catheterization in setting of non-STEMI.  DD noted on echo January 2017  . Coronary artery disease involving autologous vein bypass graft 2017   Admitted for ACS/aborted inferior STEMI -- occluded SVG-RI-OM; PCI of proximal (Promus DES 3.5 x 32 -- 3.8 mm) and sequential limb from RI-OM (Promus DES 3.5 x 12--3.7 mm).    . Coronary artery disease involving native coronary artery with angina pectoris ([place]) 2004   a. s/p CABG in 2004 b. 12/2015 NSTEMI --> * 100%  SVG-RI-OM (80% RI-OM limb). Ost LM 99%, ost-pLAD 100% &100% after grafted D2. Osti-RI  & ostCx100%. ost-pRCA 80%, dRCA 70%, Ostial rPDA 60%. SVG-dRCA, SVG-D2 and LIMA-LAD patent.   Mod-severe LV dysfunction w/ mod-severely elevated LVEDP.  . Depression   . Foot drop, bilateral 03/22/2013  . GERD (gastroesophageal reflux disease)   . Gout    . Hyperlipidemia   . Hypertension   . Hypertensive heart disease with congestive heart failure and stage 2 kidney disease ([place])   . Neuropathy    In both legs, below knee  . Peripheral neuropathy    Probable  . Prostate cancer [facility])    Prostate  . Pulmonary fibrosis, unspecified ([place]) 2002   With interstitial lung disease.  . Pulmonary hypertension due to interstitial lung disease (HCC)    PA pressures by echocardiogram estimated at 70-78 mmHg. Is on home oxygen.    Past Surgical History:  Procedure Laterality Date  . CARDIAC CATHETERIZATION  09/24/2004   EF 60%  . CARDIAC CATHETERIZATION N/A 01/27/2016   Procedure: Left Heart Cath and Cors/Grafts Angiography;  Surgeon: Leonie Man, MD;  Location: [place] [facility];  Service: CV; * 100% SVG-RI-OM (80% RI-OM limb). Ost LM 99%, ost-pLAD 100% &100% after grafted D2. Osti-RI  & ostCx100%. ost-pRCA 80%, dRCA 70%, Ostial rPDA 60%. SVG-dRCA, SVG-D2 and LIMA-LAD patent.   Mod-severe LV dysfunction w/ mod-severely elevated LVEDP.  Marland Kitchen CARDIAC CATHETERIZATION N/A 01/27/2016   Procedure: Coronary Stent Intervention;  Surgeon: Leonie Man, MD;  Location: [place] [facility];  Service: Cardiovascular;  occluded SVG-RI-OM; PCI of proximal (Promus DES 3.5 mm x 32 mm -- 3.8 mm) & seq limb from RI-OM (Promus DES 3.64m x 145m-3.7 mm)  . CARDIOVASCULAR STRESS TEST  07/17/2010   EF 61%  . CATARACT EXTRACTION, BILATERAL    . CORONARY ARTERY BYPASS GRAFT  2004  . HEMORRHOID SURGERY    . radium seed implant     Prostate cancer  . TONSILLECTOMY    . TRANSTHORACIC ECHOCARDIOGRAM  Nov 2016; Jan 2017   a. Normal LV size, function, EF 60-65%.no RWMA. Gr 1 DD, mild LA dilation, mod RV dilation - PAP ~78 mmHg;;b.EF 50-55%. GR 1 DD. Mild MR. Mild-moderate RV dilation. PAP ~70 mmHg.  . Marland KitchenSKoreaCHOCARDIOGRAPHY  01/19/2008   EF 55-60%   Cardiac Cath - PCI 01/27/2016 Diagnostic Diagram       Post-Intervention Diagram          Carotid artery Dopplers March 2018:  Stable 6016-10%ICA, <4<96%ICA, > 5004%ilateral ECA. Normal subclavian and vertebral arteries bilaterally.  Current Meds  Medication Sig  . allopurinol (ZYLOPRIM) 100 MG tablet Take 1 tablet (100 mg total) by mouth 2 (two) times daily.  . Marland KitchenLPRAZolam (XANAX) 0.5 MG tablet Take 0.025 mg by mouth 3 (three) times daily as needed for anxiety (anxiety).   . B Complex-C (SUPER B COMPLEX PO) Take 1 tablet by mouth daily.  . cholecalciferol (VITAMIN D) 1000 UNITS tablet Take 1,000 Units by mouth daily.  . fluocinonide cream (LIDEX) 0.5.40 Apply 1 application topically daily as needed (itching).   . furosemide (LASIX) 20 MG tablet Take 1 tablet (20 mg total) by mouth daily.  . Glucosamine-Chondroitin 250-200 MG TABS Take 250 mg by mouth.  . Marland KitchenYDROcodone-acetaminophen (NORCO/VICODIN) 5-325 MG per tablet Take 0.5 tablets by mouth every 6 (six) hours as needed for moderate pain (pain).   . Marland Kitchenosartan-hydrochlorothiazide (HYZAAR) 100-12.5 MG tablet Take 0.5 tablets by mouth daily.  .Marland Kitchen  metoprolol tartrate (LOPRESSOR) 25 MG tablet Take 1 tablet (25 mg total) by mouth 2 (two) times daily.  . Misc Natural Products (OSTEO BI-FLEX JOINT SHIELD PO) Take 1 tablet by mouth 2 (two) times daily.   . nitroGLYCERIN (NITROSTAT) 0.4 MG SL tablet Place 1 tablet (0.4 mg total) under the tongue every 5 (five) minutes as needed for chest pain (x 3 doses). For chest pain  . omega-3 acid ethyl esters (LOVAZA) 1 G capsule Take 1 g by mouth 3 (three) times daily.  Marland Kitchen omeprazole (PRILOSEC) 20 MG capsule Take 20 mg by mouth 2 (two) times daily.  . Red Yeast Rice 600 MG CAPS Take 600 mg by mouth 2 (two) times daily.  . ticagrelor (BRILINTA) 90 MG TABS tablet Take 1 tablet (90 mg total) by mouth 2 (two) times daily.    Allergies  Allergen Reactions  . Lyrica [Pregabalin]     Gait instability  . Statins Other (See Comments)    Weakness of legs  . Ibuprofen Rash   Social History   Tobacco Use  . Smoking status: Former Smoker     Packs/day: 3.00    Years: 25.00    Pack years: 75.00    Types: Cigarettes    Last attempt to quit: 08/01/1991    Years since quitting: 26.3  . Smokeless tobacco: Never Used  Substance Use Topics  . Alcohol use: No    Alcohol/week: 0.0 oz  . Drug use: No    Social History   Social History Narrative   Patient lives at home with his wife Mikle Bosworth).   Retired.    Education some college.Right handed.   Caffeine coffee four cups .     family history includes Alzheimer's disease (age of onset: 46) in his mother; Coronary artery disease in his father; Prostate cancer in his father.  Wt Readings from Last 3 Encounters:  11/30/17 198 lb 6.4 oz (90 kg)  09/14/17 197 lb 2 oz (89.4 kg)  06/01/17 200 lb (90.7 kg)    PHYSICAL EXAM BP (!) 152/64   Pulse 60   Ht 6' (1.829 m)   Wt 198 lb 6.4 oz (90 kg)   SpO2 94%   BMI 26.91 kg/m   Physical Exam  Constitutional: He is oriented to person, place, and time. He appears well-developed and well-nourished. No distress.  Mildly chronically ill appearing.  Uses home oxygen.  Well-groomed.  Pleasant mood and affect.  HENT:  Head: Normocephalic and atraumatic.  Mouth/Throat: No oropharyngeal exudate.  Neck: No hepatojugular reflux and no JVD present. Carotid bruit is not present.  Cardiovascular: Normal rate, regular rhythm, normal heart sounds and intact distal pulses.  Pulmonary/Chest: No respiratory distress. He has wheezes (With coarse diffuse rhonchi and crackles.  No rales.). He exhibits no tenderness.  On home oxygen per nasal cannula; baseline accessory muscle use, but does not seem to be labored.  Abdominal: Soft. Bowel sounds are normal. He exhibits no distension. There is no tenderness.  Musculoskeletal: Normal range of motion. He exhibits edema (1+ bilaterally with support stockings in place.).  Slow unsteady gait with foot drop  Neurological: He is alert and oriented to person, place, and time.  Psychiatric: His behavior is normal.  Judgment normal.  Nursing note and vitals reviewed.    Adult ECG Report n/a  Other studies Reviewed: Additional studies/ records that were reviewed today include:  Recent Labs:   Due to be checked by PCP next week -  May 2018: Pertinent labs sodium  138, potassium 4.4.  BUN/creatinine 26/7.  LFTs.   Total cholestero 182, triglycerides 163, HDL 29, LDL 136.  Non-HDL 153.    ASSESSMENT / PLAN: Problem List Items Addressed This Visit    Bilateral carotid artery disease (HCC) (Chronic)    Carotid Dopplers remain stable.  Due for annual follow-up in the spring.      Relevant Orders   VAS US CAROTID   Coronary artery disease involving native heart with angina pectoris ([place]) - Primary (Chronic)    Severe disease of the native arteries with minimal antegrade flow of all negatives.  Distal vessel beyond anastomoses of the grafts are also relatively normal however with exception of the ostial PDA. No recurrent anginal symptoms.  Converting from Brilinta to Plavix now. On beta-blocker and ARB. On low-dose Crestor.      DOE (dyspnea on exertion)   Dyslipidemia, goal LDL below 70 (Chronic)    He is taking Crestor 1 day a week which is all he can tolerate. Lipids look pretty good last June -but notably worse this may..  Is due for labs to be checked next week by his PCP. At this point I think would avoid further adjustment of meds based on quality of life      Essential hypertension (Chronic)    Again mildly elevated today.  Elected to simply monitor and not further titrate as he just took his medications.  Remains elevated may need to add calcium channel blocker.      Hypertensive heart disease (Chronic)    He has diastolic dysfunction but preserved preserved EF and elevated LVEDP which serves to potentiate his pulmonary hypertension.  He is notably high blood pressure today, but has just taken his blood pressure medicines prior to coming in.  His blood pressures at home are usually in the  130s.  He has had some orthostatic dizziness, organism monitor his pressures.  Reluctant to push his blood pressure medications any further.  I think would end up having to add another medication which would probably need to be a vasodilator such as amlodipine if necessary.  But for now we will hold off.      IPF (idiopathic pulmonary fibrosis) (HCC)   Moderate to severe pulmonary hypertension (HCC) (Chronic)    Secondary to pulmonary disease.  On home oxygen.  Pre-much end stage pulmonary fibrosis.  No plans to pursue right heart catheterization for as there is no real management options.  He really declines any aggressive medical therapies.         Current medicines are reviewed at length with the patient today. (+/- concerns) n/a The following changes have been made: on Crestor  Patient Instructions  No change with current medications   SCHEDULE IN MARCH 2019  AT [facility] Your physician has requested that you have a carotid duplex. This test is an ultrasound of the carotid arteries in your neck. It looks at blood flow through these arteries that supply the brain with blood. Allow one hour for this exam. There are no restrictions or special instructions.     Your physician wants you to follow-up in 6 month with DR Avyn Coate. You will receive a reminder letter in the mail two months in advance. If you don't receive a letter, please call our office to schedule the follow-up appointment.   If you need a refill on your cardiac medications before your next appointment, please call your pharmacy.    Studies Ordered:   No orders of the  defined types were placed in this encounter.     Glenetta Hew, M.D., M.S. Interventional Cardiologist   Pager # [phone] Phone # [phone] [address] [place], [place] [address]

## 2017-11-30 NOTE — Patient Instructions (Addendum)
No change with current medications   SCHEDULE IN MARCH 2019  AT [place] Your physician has requested that you have a carotid duplex. This test is an ultrasound of the carotid arteries in your neck. It looks at blood flow through these arteries that supply the brain with blood. Allow one hour for this exam. There are no restrictions or special instructions.     Your physician wants you to follow-up in 6 month with DR HARDING. You will receive a reminder letter in the mail two months in advance. If you don't receive a letter, please call our office to schedule the follow-up appointment.   If you need a refill on your cardiac medications before your next appointment, please call your pharmacy.

## 2017-12-01 ENCOUNTER — Encounter: Payer: Self-pay | Admitting: Cardiology

## 2017-12-01 NOTE — Assessment & Plan Note (Addendum)
He is taking Crestor 1 day a week which is all he can tolerate. Lipids look pretty good last June -but notably worse this may..  Is due for labs to be checked next week by his PCP. At this point I think would avoid further adjustment of meds based on quality of life

## 2017-12-01 NOTE — Assessment & Plan Note (Signed)
Carotid Dopplers remain stable.  Due for annual follow-up in the spring.

## 2017-12-01 NOTE — Assessment & Plan Note (Signed)
Severe disease of the native arteries with minimal antegrade flow of all negatives.  Distal vessel beyond anastomoses of the grafts are also relatively normal however with exception of the ostial PDA. No recurrent anginal symptoms.  Converting from Brilinta to Plavix now. On beta-blocker and ARB. On low-dose Crestor.

## 2017-12-01 NOTE — Assessment & Plan Note (Signed)
He has diastolic dysfunction but preserved preserved EF and elevated LVEDP which serves to potentiate his pulmonary hypertension.  He is notably high blood pressure today, but has just taken his blood pressure medicines prior to coming in.  His blood pressures at home are usually in the 130s.  He has had some orthostatic dizziness, organism monitor his pressures.  Reluctant to push his blood pressure medications any further.  I think would end up having to add another medication which would probably need to be a vasodilator such as amlodipine if necessary.  But for now we will hold off.

## 2017-12-01 NOTE — Assessment & Plan Note (Signed)
Again mildly elevated today.  Elected to simply monitor and not further titrate as he just took his medications.  Remains elevated may need to add calcium channel blocker.

## 2017-12-01 NOTE — Assessment & Plan Note (Signed)
Secondary to pulmonary disease.  On home oxygen.  Pre-much end stage pulmonary fibrosis.  No plans to pursue right heart catheterization for as there is no real management options.  He really declines any aggressive medical therapies.

## 2017-12-14 ENCOUNTER — Telehealth: Payer: Self-pay | Admitting: Cardiology

## 2017-12-14 MED ORDER — CLOPIDOGREL BISULFATE 75 MG PO TABS: 75.0000 mg | ORAL_TABLET | Freq: Every day | ORAL | 11 refills | Status: DC

## 2017-12-14 NOTE — Telephone Encounter (Signed)
Spoke with pt, when he was last seen he was changed from brilinta to plavix and he needs a script sent to the pharmacy. New script sent to the pharmacy

## 2017-12-14 NOTE — Telephone Encounter (Signed)
New message  Pt verbalized that he is calling for the rn

## 2018-01-04 ENCOUNTER — Other Ambulatory Visit: Payer: Self-pay | Admitting: Cardiology

## 2018-01-04 DIAGNOSIS — R0902 Hypoxemia: Secondary | ICD-10-CM

## 2018-01-04 DIAGNOSIS — I213 ST elevation (STEMI) myocardial infarction of unspecified site: Secondary | ICD-10-CM

## 2018-01-04 DIAGNOSIS — E785 Hyperlipidemia, unspecified: Secondary | ICD-10-CM

## 2018-01-04 DIAGNOSIS — Z951 Presence of aortocoronary bypass graft: Secondary | ICD-10-CM

## 2018-01-04 DIAGNOSIS — R0609 Other forms of dyspnea: Secondary | ICD-10-CM

## 2018-01-08 ENCOUNTER — Other Ambulatory Visit: Payer: Self-pay | Admitting: Cardiology

## 2018-03-02 ENCOUNTER — Ambulatory Visit (HOSPITAL_COMMUNITY)
Admission: RE | Admit: 2018-03-02 | Discharge: 2018-03-02 | Disposition: A | Discharge status: Home / Self Care | Payer: Medicare Other | Source: Ambulatory Visit | Attending: Internal Medicine | Admitting: Internal Medicine

## 2018-03-02 DIAGNOSIS — I779 Disorder of arteries and arterioles, unspecified: Secondary | ICD-10-CM

## 2018-03-02 DIAGNOSIS — Z87891 Personal history of nicotine dependence: Secondary | ICD-10-CM | POA: Diagnosis not present

## 2018-03-02 DIAGNOSIS — E785 Hyperlipidemia, unspecified: Secondary | ICD-10-CM | POA: Diagnosis not present

## 2018-03-02 DIAGNOSIS — I6523 Occlusion and stenosis of bilateral carotid arteries: Secondary | ICD-10-CM | POA: Insufficient documentation

## 2018-03-02 DIAGNOSIS — I2510 Atherosclerotic heart disease of native coronary artery without angina pectoris: Secondary | ICD-10-CM | POA: Insufficient documentation

## 2018-03-02 DIAGNOSIS — I10 Essential (primary) hypertension: Secondary | ICD-10-CM | POA: Insufficient documentation

## 2018-03-02 DIAGNOSIS — I739 Peripheral vascular disease, unspecified: Secondary | ICD-10-CM

## 2018-03-03 ENCOUNTER — Telehealth: Payer: Self-pay | Admitting: *Deleted

## 2018-03-03 DIAGNOSIS — I779 Disorder of arteries and arterioles, unspecified: Secondary | ICD-10-CM

## 2018-03-03 DIAGNOSIS — I739 Peripheral vascular disease, unspecified: Principal | ICD-10-CM

## 2018-03-03 NOTE — Telephone Encounter (Signed)
-----  Message from Leonie Man, MD sent at 03/02/2018  2:30 PM EST ----- Carotid Doppler results: Right carotid stable 60-79% right internal carotid, common carotid less than 50%. Left internal carotid has slightly progressed into the 40-59% range (was previously less than 40%.) -->  Will need to follow this Both vertebral arteries are open.  Right subclavian artery has some disruption of flow, but the left is normal.  We will continue to follow-up annually.  Nothing to worry about at this time.  Glenetta Hew, MD  pls fwd to PCP: Imagene Riches, NP

## 2018-03-03 NOTE — Telephone Encounter (Signed)
Spoke to patient. Result given . Verbalized understanding 6 month appointment given for 05/31/18 Routed to R. York NP. PLACED  ANNUAL ORDER

## 2018-04-15 ENCOUNTER — Telehealth: Payer: Self-pay | Admitting: Pulmonary Disease

## 2018-04-15 NOTE — Telephone Encounter (Addendum)
Spoke to pt's PCP, Heide Scales, NP. Rollene Fare stated that pt came in for OV today and c/o increased sob over the past 6 weeks. Rollene Fare also stated that pt's spO2 on 4L was 84% with mild exertion while in office. She stated that she had seen Pulmonary hypertension mentioned within pt's chart, and was considering prescribing Sildenafil but seen where this medication could possibly react with something Rollene Fare did not mention what). Pt has been scheduled for OV with Dr. Vaughan Browner on 05/19/18 to discuss care and treatment further.  Nothing further is needed.

## 2018-04-19 ENCOUNTER — Encounter: Payer: Self-pay | Admitting: Pulmonary Disease

## 2018-04-19 ENCOUNTER — Ambulatory Visit (INDEPENDENT_AMBULATORY_CARE_PROVIDER_SITE_OTHER): Payer: Medicare Other | Admitting: Pulmonary Disease

## 2018-04-19 VITALS — BP 122/62 | HR 78 | Ht 73.0 in | Wt 198.4 lb

## 2018-04-19 DIAGNOSIS — J84112 Idiopathic pulmonary fibrosis: Secondary | ICD-10-CM

## 2018-04-19 NOTE — Patient Instructions (Signed)
I will check with Dr. Allean Found and see if we can make a referral to hospice Your chest x-ray shows progression of interstitial lung disease Continue using supplemental oxygen Follow-up in 3 months.

## 2018-04-19 NOTE — Progress Notes (Signed)
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 is a 83 year old with history of hypertension, coronary artery disease, gout. Admitted to [facility] on 11/08/15 with dyspnea, hypoxemia, and nSTEMI. Found to have pulmonary fibrosis on CT and severe pulmonary hypertension.  He has history of gout and was on colchicine in the past. This was stopped secondary to neuropathy. He is currently on allopurinol. He also has history of osteoarthritis, diastolic congestive heart failure, coronary artery disease. He worked in various jobs initially in a Copy. 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 He may have had significant exposure to asbestos during this time. 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 He lives with his wife. He has a 75-pack-year history of smoking. Quit around 1995.  This is significant family history of pulmonary fibrosis on his mother's side. His mother, aunt and cousins all suffered from pulmonary fibrosis. He was told that his mother developed pulmonary fibrosis from medication for arthritis. He does not recall what this medication is. His family was enrolled in a gene study for pulmonary fibrosis at [facility] about 5 years ago. He was told that his family had a susceptibility gene for primary fibrosis. He does not know if he carries a gene as well and he does not know what this gene is. There is history of prostate cancer on his father's side.   He denies any other exposures except for asbestos. He denies any joint pain except for gout and osteoarthritis. He denies any signs and symptoms of connective tissue disease or autoimmune disease. I had recommended a workup  but he initially refused and was discharged. However he returns now for follow-up accompanied by his wife. He was evaluated for oxygen and discharged on 2 L O2 24/7.  Interim history: Referred back by his primary care for worsening dyspnea, hypoxia.  He states that he has significant pain, neuropathy which along with his shortness of breath is impairing his quality of life He had a chest x-ray last week which shows worsening fibrosis.  Outpatient Encounter Medications as of 04/19/2018  Medication Sig  . allopurinol (ZYLOPRIM) 100 MG tablet Take 1 tablet (100 mg total) by mouth 2 (two) times daily.  Marland Kitchen ALPRAZolam (XANAX) 0.5 MG tablet Take 0.025 mg by mouth 3 (three) times daily as needed for anxiety (anxiety).   . B Complex-C (SUPER B COMPLEX PO) Take 1 tablet by mouth daily.  . cholecalciferol (VITAMIN D) 1000 UNITS tablet Take 1,000 Units by mouth daily.  . clopidogrel (PLAVIX) 75 MG tablet Take 1 tablet (75 mg total) by mouth daily.  . fluocinonide cream (LIDEX) 3.84 % Apply 1 application topically daily as needed (itching).   . furosemide (LASIX) 20 MG tablet TAKE 1 TABLET BY MOUTH DAILY  . Glucosamine-Chondroitin 250-200 MG TABS Take 250 mg by mouth.  Marland Kitchen HYDROcodone-acetaminophen (NORCO/VICODIN) 5-325 MG per tablet Take 0.5 tablets by mouth every 6 (six) hours as needed for moderate pain (pain).   Marland Kitchen losartan-hydrochlorothiazide (HYZAAR) 100-12.5 MG tablet Take 0.5 tablets by mouth daily.  . metoprolol tartrate (LOPRESSOR) 25 MG tablet TAKE 1 TABLET BY MOUTH TWICE DAILY  .  Misc Natural Products (OSTEO BI-FLEX JOINT SHIELD PO) Take 1 tablet by mouth 2 (two) times daily.   . nitroGLYCERIN (NITROSTAT) 0.4 MG SL tablet Place 1 tablet (0.4 mg total) under the tongue every 5 (five) minutes as needed for chest pain (x 3 doses). For chest pain  . omega-3 acid ethyl esters (LOVAZA) 1 G capsule Take 1 g by mouth 3 (three) times daily.  Marland Kitchen omeprazole (PRILOSEC) 20 MG capsule Take 20 mg by mouth 2 (two)  times daily.  . Red Yeast Rice 600 MG CAPS Take 600 mg by mouth 2 (two) times daily.  . rosuvastatin (CRESTOR) 10 MG tablet Take 1 tablet (10 mg total) by mouth every other day.   No facility-administered encounter medications on file as of 04/19/2018.     Allergies as of 04/19/2018 - Review Complete 04/19/2018  Allergen Reaction Noted  . Lyrica [pregabalin]  09/22/2013  . Statins Other (See Comments)   . Ibuprofen Rash 08/01/2011    Past Medical History:  Diagnosis Date  . Anxiety disorder   . Arthritis   . Chronic diastolic heart failure secondary to coronary artery disease (HCC)    Elevated LVEDP noted on cardiac catheterization in setting of non-STEMI.  DD noted on echo January 2017  . Coronary artery disease involving autologous vein bypass graft 2017   Admitted for ACS/aborted inferior STEMI -- occluded SVG-RI-OM; PCI of proximal (Promus DES 3.5 x 32 -- 3.8 mm) and sequential limb from RI-OM (Promus DES 3.5 x 12--3.7 mm).    . Coronary artery disease involving native coronary artery with angina pectoris ([place]) 2004   a. s/p CABG in 2004 b. 12/2015 NSTEMI --> * 100% SVG-RI-OM (80% RI-OM limb). Ost LM 99%, ost-pLAD 100% &100% after grafted D2. Osti-RI  & ostCx100%. ost-pRCA 80%, dRCA 70%, Ostial rPDA 60%. SVG-dRCA, SVG-D2 and LIMA-LAD patent.   Mod-severe LV dysfunction w/ mod-severely elevated LVEDP.  . Depression   . Foot drop, bilateral 03/22/2013  . GERD (gastroesophageal reflux disease)   . Gout   . Hyperlipidemia   . Hypertension   . Hypertensive heart disease with congestive heart failure and stage 2 kidney disease ([place])   . Neuropathy    In both legs, below knee  . Peripheral neuropathy    Probable  . Prostate cancer [facility])    Prostate  . Pulmonary fibrosis, unspecified ([place]) 2002   With interstitial lung disease.  . Pulmonary hypertension due to interstitial lung disease (HCC)    PA pressures by echocardiogram estimated at 70-78 mmHg. Is on home oxygen.    Past  Surgical History:  Procedure Laterality Date  . CARDIAC CATHETERIZATION  09/24/2004   EF 60%  . CARDIAC CATHETERIZATION N/A 01/27/2016   Procedure: Left Heart Cath and Cors/Grafts Angiography;  Surgeon: Leonie Man, MD;  Location: [facility];  Service: CV; * 100% SVG-RI-OM (80% RI-OM limb). Ost LM 99%, ost-pLAD 100% &100% after grafted D2. Osti-RI  & ostCx100%. ost-pRCA 80%, dRCA 70%, Ostial rPDA 60%. SVG-dRCA, SVG-D2 and LIMA-LAD patent.   Mod-severe LV dysfunction w/ mod-severely elevated LVEDP.  Marland Kitchen CARDIAC CATHETERIZATION N/A 01/27/2016   Procedure: Coronary Stent Intervention;  Surgeon: Leonie Man, MD;  Location: [place] [facility];  Service: Cardiovascular;  occluded SVG-RI-OM; PCI of proximal (Promus DES 3.5 mm x 32 mm -- 3.8 mm) & seq limb from RI-OM (Promus DES 3.98m x 186m-3.7 mm)  . CARDIOVASCULAR STRESS TEST  07/17/2010   EF 61%  . CATARACT EXTRACTION, BILATERAL    .  CORONARY ARTERY BYPASS GRAFT  2004  . HEMORRHOID SURGERY    . radium seed implant     Prostate cancer  . TONSILLECTOMY    . TRANSTHORACIC ECHOCARDIOGRAM  Nov 2016; Jan 2017   a. Normal LV size, function, EF 60-65%.no RWMA. Gr 1 DD, mild LA dilation, mod RV dilation - PAP ~78 mmHg;;b.EF 50-55%. GR 1 DD. Mild MR. Mild-moderate RV dilation. PAP ~70 mmHg.  Marland Kitchen US ECHOCARDIOGRAPHY  01/19/2008   EF 55-60%    Family History  Problem Relation Age of Onset  . Alzheimer's disease Mother 6  . Coronary artery disease Father   . Prostate cancer Father     Social History   Socioeconomic History  . Marital status: Married    Spouse name: Not on file  . Number of children: 2  . Years of education: 77  . Highest education level: Not on file  Occupational History  . Occupation: Retired    Comment: retired  Scientific laboratory technician  . Financial resource strain: Not on file  . Food insecurity:    Worry: Not on file    Inability: Not on file  . Transportation needs:    Medical: Not on file    Non-medical: Not on file    Tobacco Use  . Smoking status: Former Smoker    Packs/day: 3.00    Years: 25.00    Pack years: 75.00    Types: Cigarettes    Last attempt to quit: 08/01/1991    Years since quitting: 26.7  . Smokeless tobacco: Never Used  Substance and Sexual Activity  . Alcohol use: No    Alcohol/week: 0.0 oz  . Drug use: No  . Sexual activity: Not on file  Lifestyle  . Physical activity:    Days per week: Not on file    Minutes per session: Not on file  . Stress: Not on file  Relationships  . Social connections:    Talks on phone: Not on file    Gets together: Not on file    Attends religious service: Not on file    Active member of club or organization: Not on file    Attends meetings of clubs or organizations: Not on file    Relationship status: Not on file  . Intimate partner violence:    Fear of current or ex partner: Not on file    Emotionally abused: Not on file    Physically abused: Not on file    Forced sexual activity: Not on file  Other Topics Concern  . Not on file  Social History Narrative   Patient lives at home with his wife Mikle Bosworth).   Retired.    Education some college.Right handed.   Caffeine coffee four cups .    Review of systems: Review of Systems  Constitutional: Negative for fever and chills.  HENT: Negative.   Eyes: Negative for blurred vision.  Respiratory: as per HPI  Cardiovascular: Negative for chest pain and palpitations.  Gastrointestinal: Negative for vomiting, diarrhea, blood per rectum. Genitourinary: Negative for dysuria, urgency, frequency and hematuria.  Musculoskeletal: Negative for myalgias, back pain and joint pain.  Skin: Negative for itching and rash.  Neurological: Negative for dizziness, tremors, focal weakness, seizures and loss of consciousness.  Endo/Heme/Allergies: Negative for environmental allergies.  Psychiatric/Behavioral: Negative for depression, suicidal ideas and hallucinations.  All other systems reviewed and are  negative.  Physical Exam: Blood pressure 122/62, pulse 78, height _0  (1.854 m), weight 198 lb 6.4 oz (90 kg),  SpO2 100 %. Gen:      No acute distress HEENT:  EOMI, sclera anicteric Neck:     No masses; no thyromegaly Lungs:    Clear to auscultation bilaterally; normal respiratory effort CV:         Regular rate and rhythm; no murmurs Abd:      + bowel sounds; soft, non-tender; no palpable masses, no distension Ext:    No edema; adequate peripheral perfusion Skin:      Warm and dry; no rash Neuro: alert and oriented x 3 Psych: normal mood and affect  Data Reviewed: Imaging CT scan 11/08/15 No PEs, Basal fibrosis reticulation. Images reviewed  CT scan high resolution 12/09/16-subpleural reticulation, peripheral bronchiectasis, traction bronchiectasis, early honeycombing. Probable UIP pattern. Images reviewed. Chest x-ray 04/16/18-worsening interstitial opacities consistent with lung fibrosis.  No infiltrate, fluid. I have reviewed the images personally  Echo (11/09/15) - Left ventricle: The cavity size was normal. Wall thickness was normal. Systolic function was normal. The estimated ejection fraction was in the range of 60% to 65%. Wall motion was normal; there were no regional wall motion abnormalities. Doppler parameters are consistent with abnormal left ventricular relaxation (grade 1 diastolic dysfunction). - Left atrium: The atrium was mildly dilated. - Right ventricle: The cavity size was moderately dilated. Systolic function was moderately reduced. - Right atrium: The atrium was moderately dilated. - Pulmonary arteries: Systolic pressure was severely increased. PA peak pressure: 78 mm Hg (S).  Serologies 12/18/15 Negative for ANA, Jo1, CCP, RF, SSA, SSB, Scl 70  PFTs 12/15/16 FVC 3.93 [89%), FEV1 2.90 (91%), F/F 74, TLC 82%, DLCO 28%  Minimal obstruction with severe diffusion defect.  Assessment:  Pulmonary fibrosis, Likely IPF Mr. Hewes appears  to have a familial form of pulmonary fibrosis. There is strong history of pulmonary fibrosis on his mother's side. His family was in fact enrolled in a study from [place] and was told that the family carries a gene for the fibrosis. He himself was not tested and does not know if he carries a gene as well. I have reviewed his latest pulmonary function tests and high-resolution CT. There is progression of fibrosis in a pattern that's probable UIP. This is likely IPF. Although there is exposure to asbestos, the progression over time and lack of pleural plaques makes it less likely. He does not have any signs and symptoms of rheumatic, autoimmune disease and serologies are negative. There are no exposures to suggest hypersensitivity.  Returns back to clinic with subacute worsening over the past several months with x-ray showing worsening fibrosis Suspect this is progression of idiopathic pulmonary fibrosis with flare.  Discussed again with patient and his wife today He reiterated that he would not want any aggressive treatment. I have recommended that we refer him to palliative care and home hospice as his prognosis is poor.  I have discussed with his primary care originator who will kindly make the referral.  Pulmonary hypertension.  His pulmonary hypertension is likely related to his ILD. There is no evidence of PEs on the CTA.  No role for pulmonary vasodilators as evidenced does not show it makes a difference in outcome Continue supplemental oxygen to prevent hypoxia.  Plan/Recommendations: - Supplemental O2 - Hospice referral  More then 1/2 the time of the 40 min visit was spent in counseling and/or coordination of care with the patient and family.  Marshell Garfinkel MD [facility] Pager [phone] 04/19/2018, 9:25 AM  CC: Imagene Riches,  NP

## 2018-05-31 ENCOUNTER — Ambulatory Visit (INDEPENDENT_AMBULATORY_CARE_PROVIDER_SITE_OTHER): Payer: Medicare Other | Admitting: Cardiology

## 2018-05-31 ENCOUNTER — Encounter: Payer: Self-pay | Admitting: Cardiology

## 2018-05-31 VITALS — BP 132/62 | HR 61 | Ht 72.0 in | Wt 199.8 lb

## 2018-05-31 DIAGNOSIS — I10 Essential (primary) hypertension: Secondary | ICD-10-CM

## 2018-05-31 DIAGNOSIS — I25119 Atherosclerotic heart disease of native coronary artery with unspecified angina pectoris: Secondary | ICD-10-CM

## 2018-05-31 DIAGNOSIS — I272 Pulmonary hypertension, unspecified: Secondary | ICD-10-CM | POA: Diagnosis not present

## 2018-05-31 DIAGNOSIS — I6523 Occlusion and stenosis of bilateral carotid arteries: Secondary | ICD-10-CM

## 2018-05-31 DIAGNOSIS — I25708 Atherosclerosis of coronary artery bypass graft(s), unspecified, with other forms of angina pectoris: Secondary | ICD-10-CM | POA: Diagnosis not present

## 2018-05-31 DIAGNOSIS — Z7189 Other specified counseling: Secondary | ICD-10-CM

## 2018-05-31 DIAGNOSIS — R600 Localized edema: Secondary | ICD-10-CM

## 2018-05-31 DIAGNOSIS — E785 Hyperlipidemia, unspecified: Secondary | ICD-10-CM

## 2018-05-31 DIAGNOSIS — J84112 Idiopathic pulmonary fibrosis: Secondary | ICD-10-CM

## 2018-05-31 MED ORDER — LOSARTAN POTASSIUM 50 MG PO TABS: 50.0000 mg | ORAL_TABLET | Freq: Every day | ORAL | 3 refills | Status: AC

## 2018-05-31 MED ORDER — FUROSEMIDE 40 MG PO TABS: ORAL_TABLET | ORAL | 3 refills | Status: AC

## 2018-05-31 NOTE — Patient Instructions (Addendum)
MEDICATION INSTRUCTION  STOP HYZAAR  START LOSARTAN 50 MG ONE TABLET DAILY  LASIX ( FUROSEMIDE) 40 MG  ONE TABLET DAILY AND MAY TAKE AN ADDITIONAL  EXTRA DOSE IF NEEDED.   LABS-TODAY CMP    Your physician recommends that you schedule a follow-up appointment ON AN AS NEEDED BASIS.

## 2018-05-31 NOTE — Progress Notes (Signed)
PCP: Imagene Riches, NP  Clinic Note: Chief Complaint  Patient presents with  . Follow-up    Edema, progressively worsening lung function.;  End-of-life discussion    HPI: Steven Cameron is a 83 y.o. male with a PMH below who presents today for ~6 month f/u for CAD-CABG, s/p Inf STEMI Jan 2017 -> PCI SVG-RI-CxOM (both limbs). Chronic HFpEF -> along with pulmonary fibrosis and pulmonary hypertension  CABG in 2004(SVG-RI-CxOM, SVG-D2, SVG-dRCA, LIMA-LAD).  WETZEL MEESTER was last seen in early December 2018 --he noted his stable baseline dyspnea but getting progressively worse from his chronic lung disease.  Was on 4 L of oxygen.  He still says he is limited by legs giving out and feeling weak =-this is limiting more so than dyspnea.  Notes peripheral neuropathy symptoms.  Well-controlled edema with support stockings.  Converted from Brilinta to Plavix.  Continue beta-blocker and ARB as well as low-dose Crestor.  Recent Hospitalizations: none  Studies Personally Reviewed - (if available, images/films reviewed: From Epic Chart or Care Everywhere)  Carotid Dopplers March 2019: Stable moderate disease: Right carotid stable 60-79% right internal carotid, common carotid less than 50%. Left internal carotid has slightly progressed into the 40-59% range (was previously less than 40%.) --> Will need to follow this Both vertebral arteries are open. Right subclavian artery has some disruption of flow, but the left is normal.   Interval History: Tanis presents today for routine follow-up indicating that he has now essentially been placed on hospice care for his end-stage lung disease that continues to get progressively worse.  He is DNR and does not want aggressive management.  He is on 4 L of oxygen and sometimes has to go higher.  He has chronic exertional dyspnea and really is more limited by his leg with dyspnea.  At this point he indicates that he really would be simply a quality of  life as opposed to any quality of life.  Is wanting more testing done. He has his chronic edema which is relatively controlled with support stockings, but has had more swelling in his feet of late.  He does have a little bit more orthopnea but really has not slept in bed in years.  He has been sleeping in a recliner.  He says his left leg is usually more swollen than the right.  He denies any true anginal symptoms with rest or exertion more of just dyspnea. He denies any rapid irregular heartbeats palpitations.  No syncope/near syncope or TIA/amaurosis fugax.  He does not walk enough to notice any claudication.  Unfortunately he is quite sedentary with bilateral leg and hip weakness and then profound dyspnea.  When he does walk he has to use a walker.  Very difficult to walk without some type of support.  He says that he spends most of the day sleeping.  No energy at baseline.  ROS: A comprehensive was performed. Review of Systems  Constitutional: Positive for malaise/fatigue (Chronic).  Respiratory: Positive for shortness of breath (Chronic).   Musculoskeletal: Positive for joint pain (Hips, knees and also foot drop). Negative for falls and myalgias.  Skin: Negative for rash (He has a dry scaly rash on the left foot and ankle. There is some component as a small red lesion.  ).  Neurological: Negative for dizziness.  Psychiatric/Behavioral: Negative for depression and memory loss. The patient is not nervous/anxious and does not have insomnia.   All other systems reviewed and are negative.  I have reviewed  and (if needed) personally updated the patient's problem list, medications, allergies, past medical and surgical history, social and family history.   Past Medical History:  Diagnosis Date  . Anxiety disorder   . Arthritis   . Chronic diastolic heart failure secondary to coronary artery disease (HCC)    Elevated LVEDP noted on cardiac catheterization in setting of non-STEMI.  DD noted on echo  January 2017  . Coronary artery disease involving autologous vein bypass graft 2017   Admitted for ACS/aborted inferior STEMI -- occluded SVG-RI-OM; PCI of proximal (Promus DES 3.5 x 32 -- 3.8 mm) and sequential limb from RI-OM (Promus DES 3.5 x 12--3.7 mm).    . Coronary artery disease involving native coronary artery with angina pectoris ([place]) 2004   a. s/p CABG in 2004 b. 12/2015 NSTEMI --> * 100% SVG-RI-OM (80% RI-OM limb). Ost LM 99%, ost-pLAD 100% &100% after grafted D2. Osti-RI  & ostCx100%. ost-pRCA 80%, dRCA 70%, Ostial rPDA 60%. SVG-dRCA, SVG-D2 and LIMA-LAD patent.   Mod-severe LV dysfunction w/ mod-severely elevated LVEDP.  . Depression   . Foot drop, bilateral 03/22/2013  . GERD (gastroesophageal reflux disease)   . Gout   . Hyperlipidemia   . Hypertension   . Hypertensive heart disease with congestive heart failure and stage 2 kidney disease ([place])   . Neuropathy    In both legs, below knee  . Peripheral neuropathy    Probable  . Prostate cancer [facility])    Prostate  . Pulmonary fibrosis, unspecified ([place]) 2002   With interstitial lung disease.  . Pulmonary hypertension due to interstitial lung disease (HCC)    PA pressures by echocardiogram estimated at 70-78 mmHg. Is on home oxygen.    Past Surgical History:  Procedure Laterality Date  . CARDIAC CATHETERIZATION  09/24/2004   EF 60%  . CARDIAC CATHETERIZATION N/A 01/27/2016   Procedure: Left Heart Cath and Cors/Grafts Angiography;  Surgeon: Leonie Man, MD;  Location: [facility];  Service: CV; * 100% SVG-RI-OM (80% RI-OM limb). Ost LM 99%, ost-pLAD 100% &100% after grafted D2. Osti-RI  & ostCx100%. ost-pRCA 80%, dRCA 70%, Ostial rPDA 60%. SVG-dRCA, SVG-D2 and LIMA-LAD patent.   Mod-severe LV dysfunction w/ mod-severely elevated LVEDP.  Marland Kitchen CARDIAC CATHETERIZATION N/A 01/27/2016   Procedure: Coronary Stent Intervention;  Surgeon: Leonie Man, MD;  Location: [place] [facility];  Service: Cardiovascular;  occluded  SVG-RI-OM; PCI of proximal (Promus DES 3.5 mm x 32 mm -- 3.8 mm) & seq limb from RI-OM (Promus DES 3.59m x 17m-3.7 mm)  . CARDIOVASCULAR STRESS TEST  07/17/2010   EF 61%  . CATARACT EXTRACTION, BILATERAL    . CORONARY ARTERY BYPASS GRAFT  2004  . HEMORRHOID SURGERY    . radium seed implant     Prostate cancer  . TONSILLECTOMY    . TRANSTHORACIC ECHOCARDIOGRAM  Nov 2016; Jan 2017   a. Normal LV size, function, EF 60-65%.no RWMA. Gr 1 DD, mild LA dilation, mod RV dilation - PAP ~78 mmHg;;b.EF 50-55%. GR 1 DD. Mild MR. Mild-moderate RV dilation. PAP ~70 mmHg.  . Marland KitchenSKoreaCHOCARDIOGRAPHY  01/19/2008   EF 55-60%   Cardiac Cath - PCI 01/27/2016 Diagnostic Diagram      Post-Intervention Diagram         Current Meds  Medication Sig  . allopurinol (ZYLOPRIM) 100 MG tablet Take 1 tablet (100 mg total) by mouth 2 (two) times daily.  . Marland KitchenLPRAZolam (XANAX) 0.5 MG tablet Take 0.025 mg by mouth 3 (three)  times daily as needed for anxiety (anxiety).   . B Complex-C (SUPER B COMPLEX PO) Take 1 tablet by mouth daily.  . cholecalciferol (VITAMIN D) 1000 UNITS tablet Take 1,000 Units by mouth daily.  . clopidogrel (PLAVIX) 75 MG tablet Take 1 tablet (75 mg total) by mouth daily.  Marland Kitchen dextromethorphan-guaiFENesin (MUCINEX DM) 30-600 MG 12hr tablet Take 1 tablet by mouth as directed.  . fluocinonide cream (LIDEX) 0.78 % Apply 1 application topically daily as needed (itching).   . Glucosamine-Chondroitin 250-200 MG TABS Take 250 mg by mouth.  Marland Kitchen HYDROcodone-acetaminophen (NORCO/VICODIN) 5-325 MG per tablet Take 0.5 tablets by mouth every 6 (six) hours as needed for moderate pain (pain).   Marland Kitchen loratadine (CLARITIN) 10 MG tablet Take 10 mg by mouth daily.  . metoprolol tartrate (LOPRESSOR) 25 MG tablet TAKE 1 TABLET BY MOUTH TWICE DAILY  . Misc Natural Products (OSTEO BI-FLEX JOINT SHIELD PO) Take 1 tablet by mouth 2 (two) times daily.   . nitroGLYCERIN (NITROSTAT) 0.4 MG SL tablet Place 1 tablet (0.4 mg total) under  the tongue every 5 (five) minutes as needed for chest pain (x 3 doses). For chest pain  . omega-3 acid ethyl esters (LOVAZA) 1 G capsule Take 1 g by mouth 3 (three) times daily.  Marland Kitchen omeprazole (PRILOSEC) 20 MG capsule Take 20 mg by mouth 2 (two) times daily.  . Red Yeast Rice 600 MG CAPS Take 600 mg by mouth 2 (two) times daily.  . [DISCONTINUED] furosemide (LASIX) 20 MG tablet TAKE 1 TABLET BY MOUTH DAILY  . [DISCONTINUED] losartan-hydrochlorothiazide (HYZAAR) 100-12.5 MG tablet Take 0.5 tablets by mouth daily.    Allergies  Allergen Reactions  . Lyrica [Pregabalin]     Gait instability  . Statins Other (See Comments)    Weakness of legs  . Ibuprofen Rash   Social History   Tobacco Use  . Smoking status: Former Smoker    Packs/day: 3.00    Years: 25.00    Pack years: 75.00    Types: Cigarettes    Last attempt to quit: 08/01/1991    Years since quitting: 26.8  . Smokeless tobacco: Never Used  Substance Use Topics  . Alcohol use: No    Alcohol/week: 0.0 oz  . Drug use: No    Social History   Social History Narrative   Patient lives at home with his wife Mikle Bosworth).   Retired.    Education some college.Right handed.   Caffeine coffee four cups .     family history includes Alzheimer's disease (age of onset: 62) in his mother; Coronary artery disease in his father; Prostate cancer in his father.  Wt Readings from Last 3 Encounters:  05/31/18 199 lb 12.8 oz (90.6 kg)  04/19/18 198 lb 6.4 oz (90 kg)  11/30/17 198 lb 6.4 oz (90 kg)    PHYSICAL EXAM BP 132/62   Pulse 61   Ht 6' (1.829 m)   Wt 199 lb 12.8 oz (90.6 kg)   BMI 27.10 kg/m   Physical Exam  Constitutional: He is oriented to person, place, and time. He appears well-developed and well-nourished. No distress.  Frail elderly man.  He definitely looks older than he did last year.  Chronically ill-appearing but in no acute distress.  HENT:  Head: Normocephalic and atraumatic.  Mouth/Throat: No oropharyngeal  exudate.  Neck: No hepatojugular reflux and no JVD present. Carotid bruit is not present.  Cardiovascular: Normal rate, regular rhythm, normal heart sounds and intact distal pulses.  Pulmonary/Chest:  No respiratory distress. He has wheezes (With coarse diffuse rhonchi and crackles.  No rales.). He exhibits no tenderness.  On high flow oxygen by nasal cannula.  Accessory muscle use.  Diffuse interstitial sounds but no obvious rales or rhonchi.  Abdominal: Soft. Bowel sounds are normal. He exhibits no distension. There is no tenderness.  Musculoskeletal: Normal range of motion. He exhibits edema (1+ bilaterally with support stockings in place.  Left ankle and foot are larger than the right).  Slow unsteady gait with foot drop  Neurological: He is alert and oriented to person, place, and time.  Psychiatric: He has a normal mood and affect. His behavior is normal. Judgment and thought content normal.  He is well with his clinical faculties today.  Nursing note and vitals reviewed.    Adult ECG Report n/a  Other studies Reviewed: Additional studies/ records that were reviewed today include:  Recent Labs:   Labs followed by PCP.   ASSESSMENT / PLAN: Problem List Items Addressed This Visit    Moderate to severe pulmonary hypertension (HCC) (Chronic)    Secondary to severe pulmonary disease.  On home oxygen and calcium channel blocker.  With the extent of lung disease that he has, would probably not benefit from vasodilators.      Relevant Medications   losartan (COZAAR) 50 MG tablet   furosemide (LASIX) 40 MG tablet   Other Relevant Orders   EKG 12-Lead (Completed)   Comprehensive metabolic panel (Completed)   IPF (idiopathic pulmonary fibrosis) (HCC)    Chronic progressive disease on significant high flow oxygen but getting worse.  He seems to be fully in agreement with the concept of no aggressive management and basically hospice care.  Is DNR/DNI.  Does not want to take the "expensive  medications because they did not help out his family member "  He is resigned to the fact that he is likely going to die soon.      Relevant Medications   dextromethorphan-guaiFENesin (MUCINEX DM) 30-600 MG 12hr tablet   loratadine (CLARITIN) 10 MG tablet   Essential hypertension (Chronic)    Controlled blood pressure.  To avoid electrolyte abnormalities, and to convert him from losartan-HCTZ 50/6.25 down to simply losartan 50 mg.      Relevant Medications   losartan (COZAAR) 50 MG tablet   furosemide (LASIX) 40 MG tablet   Dyslipidemia, goal LDL below 70 (Chronic)    Honestly I think it is fine for him to stop statin and Lovaza.      Relevant Medications   losartan (COZAAR) 50 MG tablet   furosemide (LASIX) 40 MG tablet   Counseling regarding end of life decision making    Agostino has severe end-stage lung disease and multivessel CAD.  Thankfully he does not have any active angina but is getting progressively worse from a dyspnea standpoint.  At this point we have decided that he would not want to have further evaluations with carotid Dopplers or any stress test.  He even does not want to do the 6-minute walk recommended by his pulmonologist.  He cannot walk any way without his dyspnea because of his leg weakness.  He seems to be fully cognizant of the fact that he is close to the end and just wants to try to improve quality of life but does not want more testing done.  He is DNR/DNI and apparently has started the hospice process.  He is not on full hospice care but is starting to have management.  He  wants to maintain as much dignity as possible.  As a result, I think we can probably start weaning off medications if he moves down the hospice pathway.  We can stop Plavix statin Lovaza to begin with.  I would probably continue beta-blocker and ARB because they are helpful for preventing heart failure exacerbation along with his Lasix.  We decided that this would likely be his last visit  with me.  I spent the better part of an hour with he and his wife having this conversation helping them come to these decisions.  They needed support in getting approval to holding off on any further testing.  Of the hour-long visit with the patient, 95% was direct patient counseling with only minimal examination.      Coronary artery disease involving native heart with angina pectoris ([place]) - Primary (Chronic)    Essentially occluded native vessels.  Graft dependent.  Has distal vessel disease beyond the graft.  not much revascularization available. Thankfully, he remains a symptomatically when anginal standpoint.  He is on a beta-blocker and ARB.  Currently on aspirin and statin however we would be okay to stop statin and Lovaza.      Relevant Medications   losartan (COZAAR) 50 MG tablet   furosemide (LASIX) 40 MG tablet   Other Relevant Orders   Comprehensive metabolic panel (Completed)   CAD (coronary artery disease) of artery bypass graft (Chronic)    History of 2 site SVG PCI for angina in the past.  At this point in time, he would not want any follow-up stress test evaluations, and probably would not pursue invasive evaluation.  He is DNR DNI, and has started down the path of hospice. No change to management.  Would be okay to stop statin and Lovaza.  Will continue with Plavix for now but okay to hold if necessary.      Relevant Medications   losartan (COZAAR) 50 MG tablet   furosemide (LASIX) 40 MG tablet   Other Relevant Orders   EKG 12-Lead (Completed)   Comprehensive metabolic panel (Completed)   Bilateral lower extremity edema    Most likely related to pulmonary hypertension support stockings and low-dose Lasix.  However of late, he has had more edema and has been increased to 40 mg of Lasix.  I will simply change the prescription to 40 mg daily with additional tablet as needed.  I will check chemistry panel today and then probably next week to see his renal function to avoid  dehydration.      Bilateral carotid artery disease (HCC) (Chronic)   Relevant Medications   losartan (COZAAR) 50 MG tablet   furosemide (LASIX) 40 MG tablet      Current medicines are reviewed at length with the patient today. (+/- concerns) n/a The following changes have been made:   Patient Instructions  MEDICATION INSTRUCTION  STOP HYZAAR  START LOSARTAN 50 MG ONE TABLET DAILY  LASIX ( FUROSEMIDE) 40 MG  ONE TABLET DAILY AND MAY TAKE AN ADDITIONAL  EXTRA DOSE IF NEEDED.   LABS-TODAY CMP    Your physician recommends that you schedule a follow-up appointment ON AN AS NEEDED BASIS.      Studies Ordered:   Orders Placed This Encounter  Procedures  . Comprehensive metabolic panel  . EKG 12-Lead    Lab Results  Component Value Date   CREATININE 1.19 05/31/2018   BUN 25 05/31/2018   NA 139 05/31/2018   K 4.9 05/31/2018   CL 97 05/31/2018  CO2 29 05/31/2018      Glenetta Hew, M.D., M.S. Interventional Cardiologist   Pager # [phone] Phone # [phone] [address] [place], [place] [address]

## 2018-06-01 ENCOUNTER — Encounter: Payer: Self-pay | Admitting: Cardiology

## 2018-06-01 DIAGNOSIS — R600 Localized edema: Secondary | ICD-10-CM | POA: Insufficient documentation

## 2018-06-01 DIAGNOSIS — Z7189 Other specified counseling: Secondary | ICD-10-CM | POA: Insufficient documentation

## 2018-06-01 LAB — COMPREHENSIVE METABOLIC PANEL
ALK PHOS: 74 IU/L (ref 39–117)
ALT: 15 IU/L (ref 0–44)
AST: 21 IU/L (ref 0–40)
Albumin/Globulin Ratio: 1.4 (ref 1.2–2.2)
Albumin: 4.2 g/dL (ref 3.5–4.7)
BUN/Creatinine Ratio: 21 (ref 10–24)
BUN: 25 mg/dL (ref 8–27)
Bilirubin Total: 0.3 mg/dL (ref 0.0–1.2)
CHLORIDE: 97 mmol/L (ref 96–106)
CO2: 29 mmol/L (ref 20–29)
CREATININE: 1.19 mg/dL (ref 0.76–1.27)
Calcium: 9.5 mg/dL (ref 8.6–10.2)
GFR calc Af Amer: 66 mL/min/{1.73_m2} (ref >59)
GFR calc non Af Amer: 57 mL/min/{1.73_m2} — ABNORMAL LOW (ref >59)
GLOBULIN, TOTAL: 3.1 g/dL (ref 1.5–4.5)
Glucose: 100 mg/dL — ABNORMAL HIGH (ref 65–99)
POTASSIUM: 4.9 mmol/L (ref 3.5–5.2)
SODIUM: 139 mmol/L (ref 134–144)
Total Protein: 7.3 g/dL (ref 6.0–8.5)

## 2018-06-01 NOTE — Assessment & Plan Note (Addendum)
Most likely related to pulmonary hypertension support stockings and low-dose Lasix.  However of late, he has had more edema and has been increased to 40 mg of Lasix.  I will simply change the prescription to 40 mg daily with additional tablet as needed.  I will check chemistry panel today and then probably next week to see his renal function to avoid dehydration.

## 2018-06-01 NOTE — Assessment & Plan Note (Signed)
Secondary to severe pulmonary disease.  On home oxygen and calcium channel blocker.  With the extent of lung disease that he has, would probably not benefit from vasodilators.

## 2018-06-01 NOTE — Assessment & Plan Note (Addendum)
Steven Cameron has severe end-stage lung disease and multivessel CAD.  Thankfully he does not have any active angina but is getting progressively worse from a dyspnea standpoint.  At this point we have decided that he would not want to have further evaluations with carotid Dopplers or any stress test.  He even does not want to do the 6-minute walk recommended by his pulmonologist.  He cannot walk any way without his dyspnea because of his leg weakness.  He seems to be fully cognizant of the fact that he is close to the end and just wants to try to improve quality of life but does not want more testing done.  He is DNR/DNI and apparently has started the hospice process.  He is not on full hospice care but is starting to have management.  He wants to maintain as much dignity as possible.  As a result, I think we can probably start weaning off medications if he moves down the hospice pathway.  We can stop Plavix statin Lovaza to begin with.  I would probably continue beta-blocker and ARB because they are helpful for preventing heart failure exacerbation along with his Lasix.  We decided that this would likely be his last visit with me.  I spent the better part of an hour with he and his wife having this conversation helping them come to these decisions.  They needed support in getting approval to holding off on any further testing.  Of the hour-long visit with the patient, 95% was direct patient counseling with only minimal examination.

## 2018-06-01 NOTE — Assessment & Plan Note (Signed)
Controlled blood pressure.  To avoid electrolyte abnormalities, and to convert him from losartan-HCTZ 50/6.25 down to simply losartan 50 mg.

## 2018-06-01 NOTE — Assessment & Plan Note (Signed)
Chronic progressive disease on significant high flow oxygen but getting worse.  He seems to be fully in agreement with the concept of no aggressive management and basically hospice care.  Is DNR/DNI.  Does not want to take the "expensive medications because they did not help out his family member "  He is resigned to the fact that he is likely going to die soon.

## 2018-06-01 NOTE — Assessment & Plan Note (Signed)
Honestly I think it is fine for him to stop statin and Lovaza.

## 2018-06-01 NOTE — Assessment & Plan Note (Signed)
Essentially occluded native vessels.  Graft dependent.  Has distal vessel disease beyond the graft.  not much revascularization available. Thankfully, he remains a symptomatically when anginal standpoint.  He is on a beta-blocker and ARB.  Currently on aspirin and statin however we would be okay to stop statin and Lovaza.

## 2018-06-01 NOTE — Assessment & Plan Note (Signed)
History of 2 site SVG PCI for angina in the past.  At this point in time, he would not want any follow-up stress test evaluations, and probably would not pursue invasive evaluation.  He is DNR DNI, and has started down the path of hospice. No change to management.  Would be okay to stop statin and Lovaza.  Will continue with Plavix for now but okay to hold if necessary.

## 2018-07-05 IMAGING — CT CT CHEST HIGH RESOLUTION W/O CM
2 of 7 series · 14 of 36 positions shown, 17 images · non-contrast
Comparison: Chest CT 11/08/2015.

CLINICAL DATA: 81-year-old male with family history of pulmonary
fibrosis. History of asbestos exposure. Chronic shortness of breath.
Previous CT scan demonstrated potential interstitial lung disease.
Followup study.

EXAM:
CT CHEST WITHOUT CONTRAST
TECHNIQUE: Multidetector CT imaging of the chest was performed following the
standard protocol without intravenous contrast. High resolution
imaging of the lungs, as well as inspiratory and expiratory imaging,
was performed.

[Series 4: high resolution · axial · 0.70mm/px · z∈[+295,+549]mm · 11 of 145 slices shown, 14 images]
[im 9/145  mediastinal]
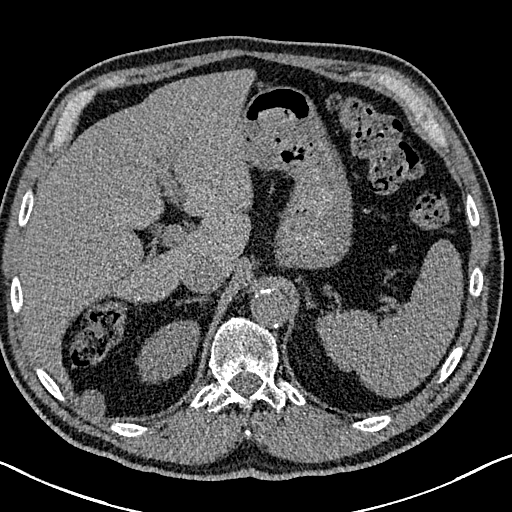
[im 9/145  lung]
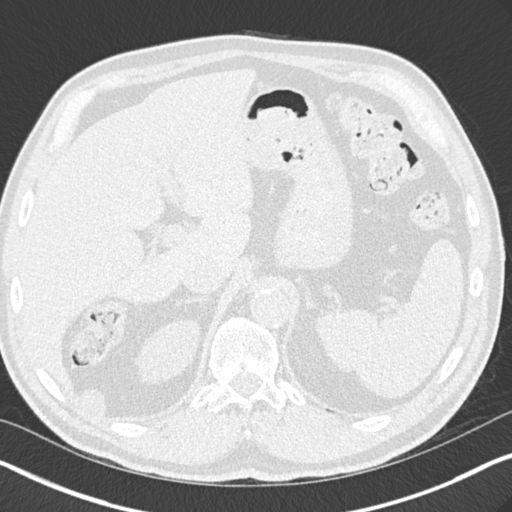
[im 26/145  lung]
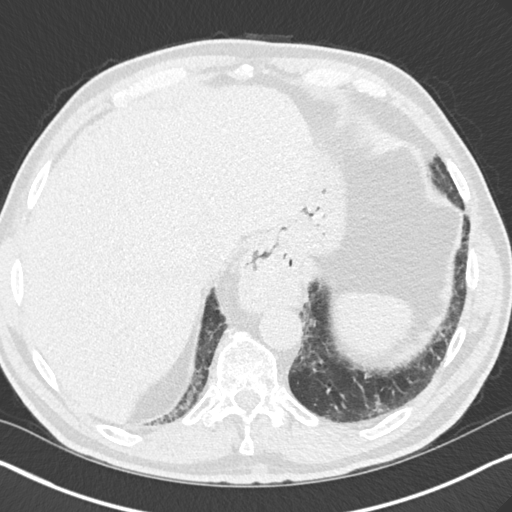
[im 34/145  lung]
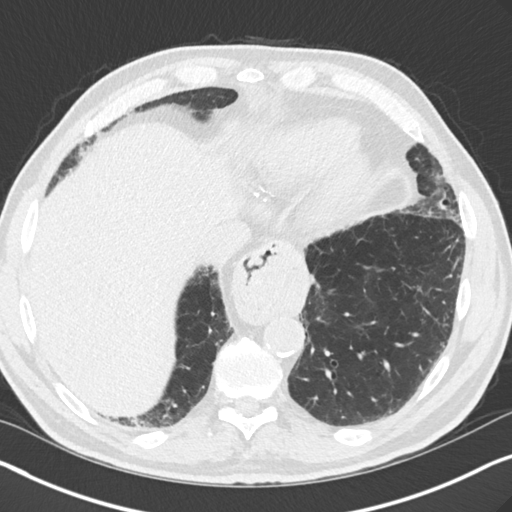
[im 51/145  lung]
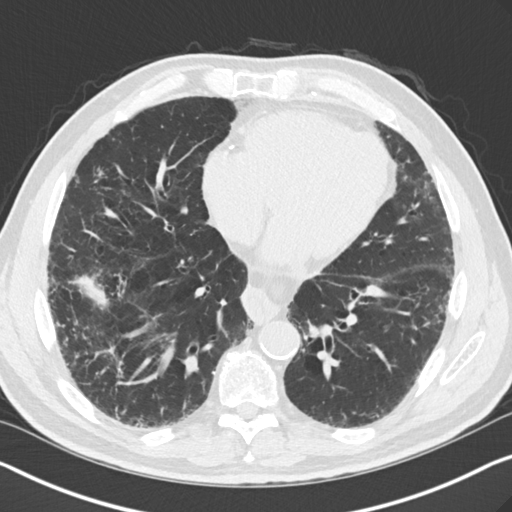
[im 60/145  mediastinal]
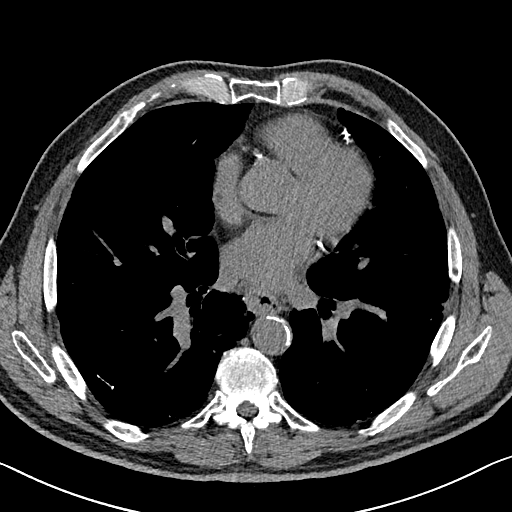
[im 60/145  lung]
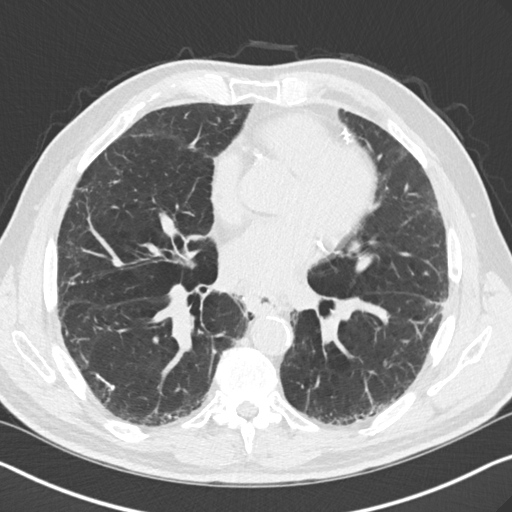
[im 77/145  lung]
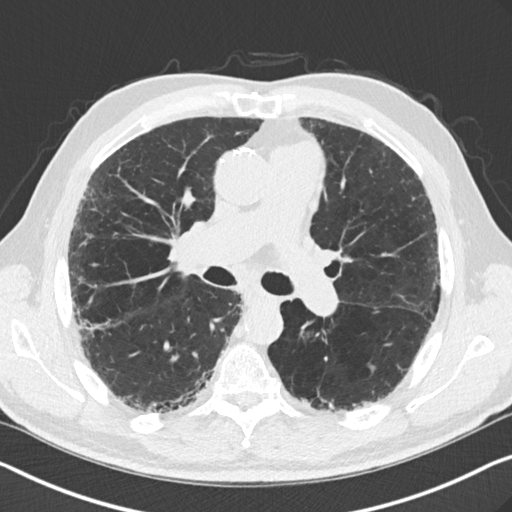
[im 85/145  lung]
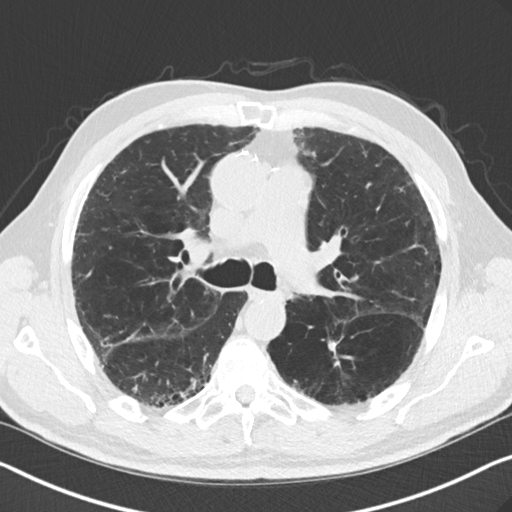
[im 94/145  lung]
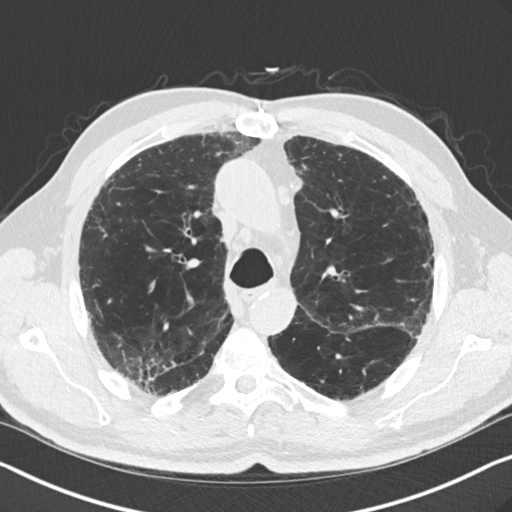
[im 111/145  mediastinal]
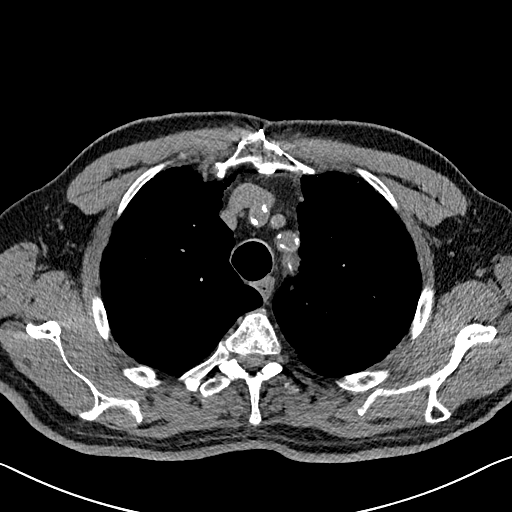
[im 111/145  lung]
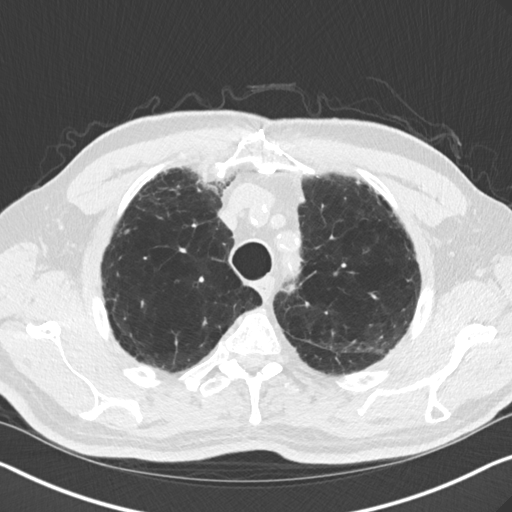
[im 119/145  lung]
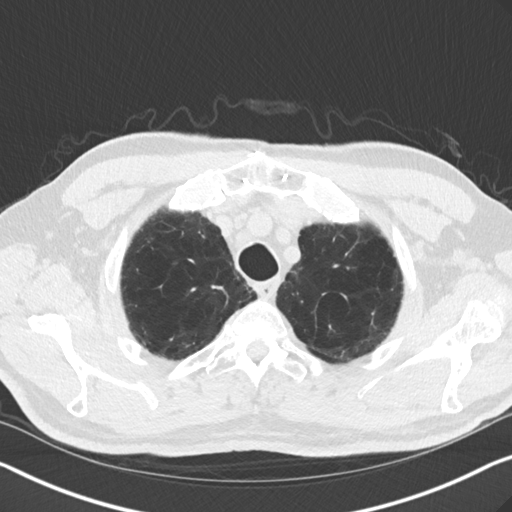
[im 136/145  lung]
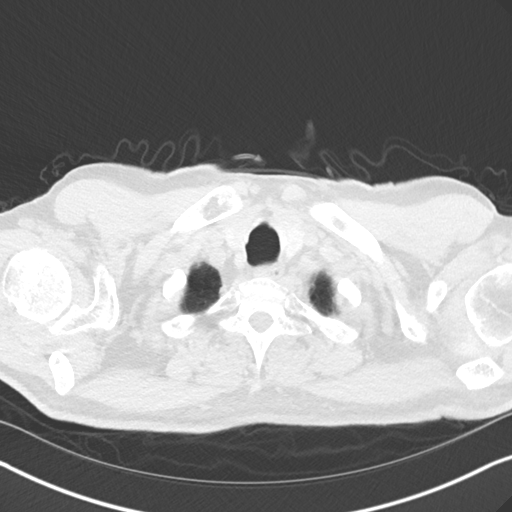

[Series 10: coronal · coronal · 0.59mm/px · 3 of 140 slices shown]
[im 28/140  lung]
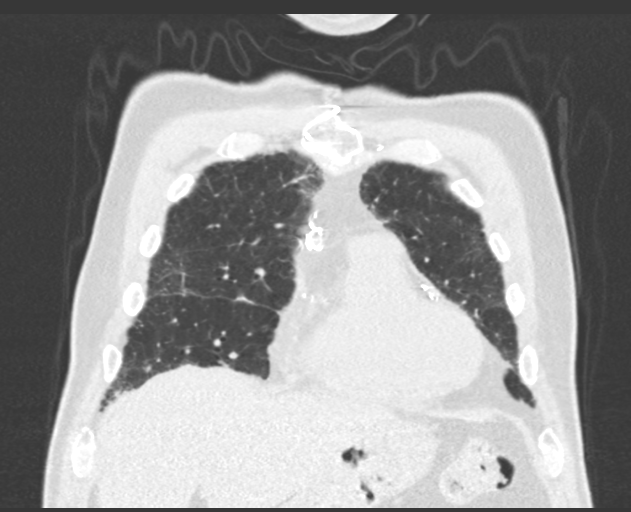
[im 56/140  lung]
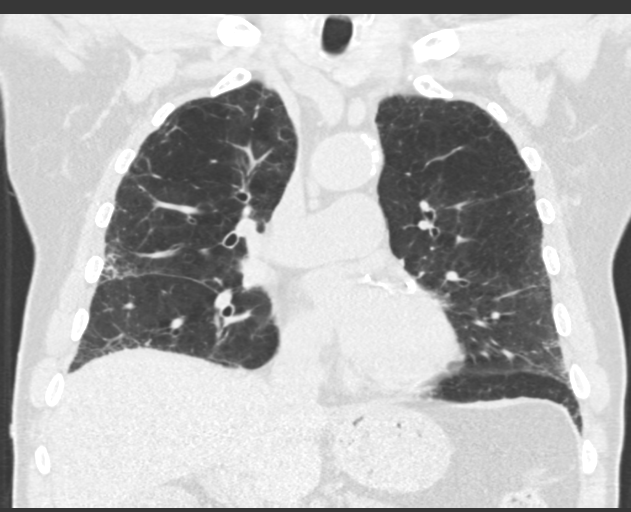
[im 84/140  lung]
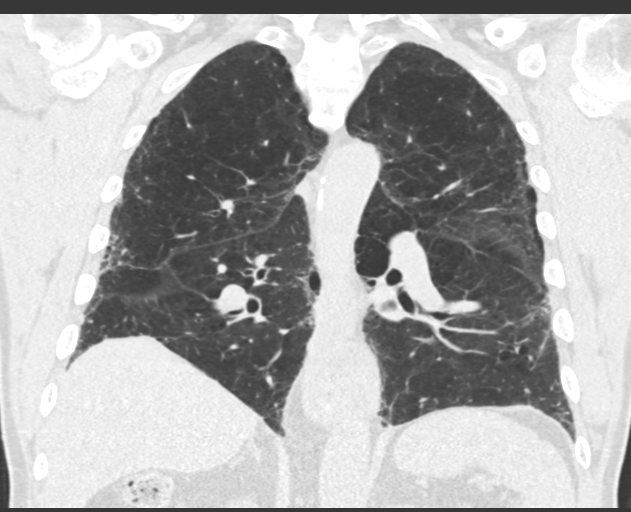

[14 of 36 positions shown; findings below may reference images not displayed]

FINDINGS: Cardiovascular: Heart size is normal. There is no significant
pericardial fluid, thickening or pericardial calcification. There is
aortic atherosclerosis, as well as atherosclerosis of the great
vessels of the mediastinum and the coronary arteries, including
calcified atherosclerotic plaque in the left main, left anterior
descending, left circumflex and right coronary arteries. Status post
median sternotomy for CABG, including [REDACTED] to the LAD.

Mediastinum/Nodes: No pathologically enlarged mediastinal or hilar
lymph nodes. Please note that accurate exclusion of hilar adenopathy
is limited on noncontrast CT scans. Moderate-sized hiatal hernia. No
axillary lymphadenopathy.

Lungs/Pleura: High-resolution images demonstrate patchy areas of
ground-glass attenuation, subpleural reticulation, peripheral
bronchiolectasis and mild traction bronchiectasis scattered
throughout the lungs bilaterally. There is a suggestion of early
honeycombing in the extreme lung bases, but this is not yet
definite. The previously described findings have a definitive
craniocaudal gradient. Mild diffuse bronchial wall thickening with
moderate to severe centrilobular emphysema. Inspiratory and
expiratory imaging demonstrates some mild air trapping, indicative
of small airways disease. There is also profound collapse of the
trachea and to a lesser extent the mainstem bronchi on expiratory
phase imaging, indicative of tracheobronchomalacia. No acute
consolidative airspace disease. No pleural effusions. No definite
suspicious appearing pulmonary nodules or masses.

Upper Abdomen: Aortic atherosclerosis. 1.4 x 2.6 cm retroperitoneal
lesion in the upper right retroperitoneum (image 139 of series 4)
which demonstrates low to intermediate attenuation centrally (24
HU), stable over numerous prior examinations dating back to
11/21/2009; this finding is of uncertain etiology and significance,
but is presumably benign.

Musculoskeletal: Median sternotomy wires. There are no aggressive
appearing lytic or blastic lesions noted in the visualized portions
of the skeleton.
IMPRESSION: 1. The appearance of the lungs is compatible with interstitial lung
disease. The overall spectrum of findings is concerning for possible
usual interstitial pneumonia (UIP), but is not yet definite. The
primary differential consideration is that of fibrotic phase
nonspecific interstitial pneumonia (NSIP), which is not favored
based on progression of disease compared to prior chest CT
11/08/2015. Repeat high-resolution chest CT is suggested in 12
months to assess for further temporal changes in the appearance of
the lung parenchyma.
2. Mild diffuse bronchial wall thickening with severe centrilobular
emphysema; imaging findings suggestive of underlying COPD.
3. No calcified pleural plaques to indicate underlying asbestos
related pleural disease.
4. Additional incidental findings, as above, similar prior
examinations.

## 2018-08-23 ENCOUNTER — Other Ambulatory Visit: Payer: Self-pay | Admitting: Cardiology

## 2018-11-26 ENCOUNTER — Other Ambulatory Visit: Payer: Self-pay | Admitting: Cardiology

## 2019-01-29 DEATH — deceased

## 2019-03-09 ENCOUNTER — Encounter (HOSPITAL_COMMUNITY): Payer: Medicare Other
